# Patient Record
Sex: Female | Born: 1935
Health system: Southern US, Community
[De-identification: ages and names within clinical notes are randomized; demographics above are authoritative.]

## PROBLEM LIST (undated history)

## (undated) DIAGNOSIS — Z972 Presence of dental prosthetic device (complete) (partial): Secondary | ICD-10-CM

## (undated) DIAGNOSIS — M25561 Pain in right knee: Secondary | ICD-10-CM

## (undated) DIAGNOSIS — E785 Hyperlipidemia, unspecified: Secondary | ICD-10-CM

## (undated) DIAGNOSIS — D509 Iron deficiency anemia, unspecified: Secondary | ICD-10-CM

## (undated) DIAGNOSIS — N3941 Urge incontinence: Secondary | ICD-10-CM

## (undated) DIAGNOSIS — N159 Renal tubulo-interstitial disease, unspecified: Secondary | ICD-10-CM

## (undated) DIAGNOSIS — Z86718 Personal history of other venous thrombosis and embolism: Secondary | ICD-10-CM

## (undated) DIAGNOSIS — K219 Gastro-esophageal reflux disease without esophagitis: Secondary | ICD-10-CM

## (undated) DIAGNOSIS — R350 Frequency of micturition: Secondary | ICD-10-CM

## (undated) DIAGNOSIS — R3915 Urgency of urination: Secondary | ICD-10-CM

## (undated) DIAGNOSIS — Z9289 Personal history of other medical treatment: Secondary | ICD-10-CM

## (undated) DIAGNOSIS — H269 Unspecified cataract: Secondary | ICD-10-CM

## (undated) DIAGNOSIS — G2581 Restless legs syndrome: Secondary | ICD-10-CM

## (undated) DIAGNOSIS — S22000A Wedge compression fracture of unspecified thoracic vertebra, initial encounter for closed fracture: Secondary | ICD-10-CM

## (undated) DIAGNOSIS — J189 Pneumonia, unspecified organism: Secondary | ICD-10-CM

## (undated) DIAGNOSIS — R911 Solitary pulmonary nodule: Secondary | ICD-10-CM

## (undated) DIAGNOSIS — C679 Malignant neoplasm of bladder, unspecified: Secondary | ICD-10-CM

## (undated) DIAGNOSIS — J449 Chronic obstructive pulmonary disease, unspecified: Secondary | ICD-10-CM

## (undated) DIAGNOSIS — I700 Atherosclerosis of aorta: Secondary | ICD-10-CM

## (undated) DIAGNOSIS — M75102 Unspecified rotator cuff tear or rupture of left shoulder, not specified as traumatic: Secondary | ICD-10-CM

## (undated) DIAGNOSIS — K8020 Calculus of gallbladder without cholecystitis without obstruction: Secondary | ICD-10-CM

## (undated) DIAGNOSIS — I340 Nonrheumatic mitral (valve) insufficiency: Secondary | ICD-10-CM

## (undated) DIAGNOSIS — I071 Rheumatic tricuspid insufficiency: Secondary | ICD-10-CM

## (undated) DIAGNOSIS — K5790 Diverticulosis of intestine, part unspecified, without perforation or abscess without bleeding: Secondary | ICD-10-CM

## (undated) DIAGNOSIS — I517 Cardiomegaly: Secondary | ICD-10-CM

## (undated) DIAGNOSIS — Z973 Presence of spectacles and contact lenses: Secondary | ICD-10-CM

## (undated) DIAGNOSIS — M810 Age-related osteoporosis without current pathological fracture: Secondary | ICD-10-CM

## (undated) DIAGNOSIS — E669 Obesity, unspecified: Secondary | ICD-10-CM

## (undated) DIAGNOSIS — R600 Localized edema: Secondary | ICD-10-CM

## (undated) DIAGNOSIS — I4891 Unspecified atrial fibrillation: Secondary | ICD-10-CM

## (undated) DIAGNOSIS — Z8719 Personal history of other diseases of the digestive system: Secondary | ICD-10-CM

## (undated) HISTORY — PX: TOTAL ABDOMINAL HYSTERECTOMY W/ BILATERAL SALPINGOOPHORECTOMY: SHX83

## (undated) HISTORY — PX: CATARACT EXTRACTION, BILATERAL: SHX1313

## (undated) HISTORY — PX: COLONOSCOPY: SHX174

## (undated) HISTORY — DX: Pneumonia, unspecified organism: J18.9

## (undated) HISTORY — PX: TONSILLECTOMY: SUR1361

## (undated) HISTORY — DX: Gastro-esophageal reflux disease without esophagitis: K21.9

## (undated) HISTORY — PX: OTHER SURGICAL HISTORY: SHX169

## (undated) HISTORY — PX: APPENDECTOMY: SHX54

## (undated) HISTORY — DX: Unspecified atrial fibrillation: I48.91

---

## 1999-02-24 HISTORY — PX: OTHER SURGICAL HISTORY: SHX169

## 1999-12-11 ENCOUNTER — Emergency Department (HOSPITAL_COMMUNITY): Admission: EM | Admit: 1999-12-11 | Discharge: 1999-12-11 | Payer: Self-pay | Admitting: Emergency Medicine

## 2000-08-03 ENCOUNTER — Emergency Department (HOSPITAL_COMMUNITY): Admission: EM | Admit: 2000-08-03 | Discharge: 2000-08-04 | Payer: Self-pay | Admitting: Emergency Medicine

## 2001-03-11 ENCOUNTER — Ambulatory Visit (HOSPITAL_COMMUNITY): Admission: RE | Admit: 2001-03-11 | Discharge: 2001-03-11 | Payer: Self-pay | Admitting: Gastroenterology

## 2001-06-21 ENCOUNTER — Ambulatory Visit (HOSPITAL_COMMUNITY): Admission: RE | Admit: 2001-06-21 | Discharge: 2001-06-21 | Payer: Self-pay | Admitting: Family Medicine

## 2001-07-08 ENCOUNTER — Encounter: Payer: Self-pay | Admitting: Family Medicine

## 2001-07-08 ENCOUNTER — Encounter: Admission: RE | Admit: 2001-07-08 | Discharge: 2001-07-08 | Payer: Self-pay | Admitting: Family Medicine

## 2001-09-01 ENCOUNTER — Encounter (INDEPENDENT_AMBULATORY_CARE_PROVIDER_SITE_OTHER): Payer: Self-pay | Admitting: Specialist

## 2001-09-01 ENCOUNTER — Ambulatory Visit (HOSPITAL_COMMUNITY): Admission: RE | Admit: 2001-09-01 | Discharge: 2001-09-01 | Payer: Self-pay | Admitting: Gastroenterology

## 2001-12-09 ENCOUNTER — Encounter: Admission: RE | Admit: 2001-12-09 | Discharge: 2001-12-09 | Payer: Self-pay | Admitting: Family Medicine

## 2001-12-09 ENCOUNTER — Encounter: Payer: Self-pay | Admitting: Family Medicine

## 2002-01-11 ENCOUNTER — Ambulatory Visit (HOSPITAL_COMMUNITY): Admission: RE | Admit: 2002-01-11 | Discharge: 2002-01-11 | Payer: Self-pay | Admitting: Cardiology

## 2002-01-11 ENCOUNTER — Encounter: Payer: Self-pay | Admitting: Cardiology

## 2005-09-22 ENCOUNTER — Ambulatory Visit (HOSPITAL_COMMUNITY): Admission: RE | Admit: 2005-09-22 | Discharge: 2005-09-22 | Payer: Self-pay | Admitting: Gastroenterology

## 2008-05-21 ENCOUNTER — Ambulatory Visit: Payer: Self-pay | Admitting: Surgery

## 2008-05-21 ENCOUNTER — Emergency Department (HOSPITAL_COMMUNITY): Admission: EM | Admit: 2008-05-21 | Discharge: 2008-05-21 | Payer: Self-pay | Admitting: Emergency Medicine

## 2008-05-21 ENCOUNTER — Encounter (INDEPENDENT_AMBULATORY_CARE_PROVIDER_SITE_OTHER): Payer: Self-pay | Admitting: Emergency Medicine

## 2010-05-12 ENCOUNTER — Emergency Department (HOSPITAL_COMMUNITY): Payer: Medicare Other

## 2010-05-12 ENCOUNTER — Observation Stay (HOSPITAL_COMMUNITY): Payer: Medicare Other

## 2010-05-12 ENCOUNTER — Inpatient Hospital Stay (HOSPITAL_COMMUNITY)
Admission: EM | Admit: 2010-05-12 | Discharge: 2010-05-14 | DRG: 204 | Disposition: A | Discharge status: Home / Self Care | Payer: Medicare Other | Attending: Internal Medicine | Admitting: Internal Medicine

## 2010-05-12 DIAGNOSIS — F17200 Nicotine dependence, unspecified, uncomplicated: Secondary | ICD-10-CM | POA: Diagnosis present

## 2010-05-12 DIAGNOSIS — D72829 Elevated white blood cell count, unspecified: Secondary | ICD-10-CM | POA: Diagnosis present

## 2010-05-12 DIAGNOSIS — K8020 Calculus of gallbladder without cholecystitis without obstruction: Secondary | ICD-10-CM | POA: Diagnosis present

## 2010-05-12 DIAGNOSIS — R7309 Other abnormal glucose: Secondary | ICD-10-CM | POA: Diagnosis not present

## 2010-05-12 DIAGNOSIS — D509 Iron deficiency anemia, unspecified: Secondary | ICD-10-CM | POA: Diagnosis present

## 2010-05-12 DIAGNOSIS — J441 Chronic obstructive pulmonary disease with (acute) exacerbation: Secondary | ICD-10-CM | POA: Diagnosis present

## 2010-05-12 DIAGNOSIS — Z79899 Other long term (current) drug therapy: Secondary | ICD-10-CM

## 2010-05-12 DIAGNOSIS — K219 Gastro-esophageal reflux disease without esophagitis: Secondary | ICD-10-CM | POA: Diagnosis present

## 2010-05-12 DIAGNOSIS — J129 Viral pneumonia, unspecified: Secondary | ICD-10-CM | POA: Diagnosis present

## 2010-05-12 DIAGNOSIS — T380X5A Adverse effect of glucocorticoids and synthetic analogues, initial encounter: Secondary | ICD-10-CM | POA: Diagnosis not present

## 2010-05-12 DIAGNOSIS — R0602 Shortness of breath: Principal | ICD-10-CM | POA: Diagnosis present

## 2010-05-12 DIAGNOSIS — J679 Hypersensitivity pneumonitis due to unspecified organic dust: Secondary | ICD-10-CM | POA: Diagnosis present

## 2010-05-12 LAB — DIFFERENTIAL
Basophils Relative: 1 % (ref 0–1)
Eosinophils Absolute: 0.3 10*3/uL (ref 0.0–0.7)
Monocytes Relative: 5 % (ref 3–12)
Neutrophils Relative %: 79 % — ABNORMAL HIGH (ref 43–77)

## 2010-05-12 LAB — COMPREHENSIVE METABOLIC PANEL
AST: 21 U/L (ref 0–37)
Albumin: 3.5 g/dL (ref 3.5–5.2)
Calcium: 8.9 mg/dL (ref 8.4–10.5)
Chloride: 102 mEq/L (ref 96–112)
Creatinine, Ser: 0.78 mg/dL (ref 0.4–1.2)
GFR calc Af Amer: >60 mL/min (ref >60)
Total Protein: 7.2 g/dL (ref 6.0–8.3)

## 2010-05-12 LAB — CK TOTAL AND CKMB (NOT AT ARMC)
CK, MB: 0.9 ng/mL (ref 0.3–4.0)
Total CK: 32 U/L (ref 7–177)

## 2010-05-12 LAB — CBC
MCH: 27 pg (ref 26.0–34.0)
MCHC: 33.7 g/dL (ref 30.0–36.0)
Platelets: 310 10*3/uL (ref 150–400)
RBC: 4.33 MIL/uL (ref 3.87–5.11)

## 2010-05-12 LAB — POCT CARDIAC MARKERS: Myoglobin, poc: 67.4 ng/mL (ref 12–200)

## 2010-05-12 LAB — TROPONIN I: Troponin I: <0.01 ng/mL (ref 0.00–0.06)

## 2010-05-13 ENCOUNTER — Inpatient Hospital Stay (HOSPITAL_COMMUNITY): Payer: Medicare Other

## 2010-05-13 LAB — GLUCOSE, CAPILLARY
Glucose-Capillary: 182 mg/dL — ABNORMAL HIGH (ref 70–99)
Glucose-Capillary: 238 mg/dL — ABNORMAL HIGH (ref 70–99)

## 2010-05-13 LAB — CBC
MCH: 26.3 pg (ref 26.0–34.0)
MCV: 79.7 fL (ref 78.0–100.0)
Platelets: 334 10*3/uL (ref 150–400)
RDW: 13.8 % (ref 11.5–15.5)

## 2010-05-13 LAB — FOLATE: Folate: >20 ng/mL

## 2010-05-13 LAB — TSH: TSH: 0.831 u[IU]/mL (ref 0.350–4.500)

## 2010-05-13 LAB — COMPREHENSIVE METABOLIC PANEL
ALT: 13 U/L (ref 0–35)
Albumin: 3.2 g/dL — ABNORMAL LOW (ref 3.5–5.2)
Alkaline Phosphatase: 80 U/L (ref 39–117)
BUN: 15 mg/dL (ref 6–23)
Chloride: 103 mEq/L (ref 96–112)
Glucose, Bld: 150 mg/dL — ABNORMAL HIGH (ref 70–99)
Potassium: 4.3 mEq/L (ref 3.5–5.1)
Sodium: 136 mEq/L (ref 135–145)
Total Bilirubin: 0.2 mg/dL — ABNORMAL LOW (ref 0.3–1.2)

## 2010-05-13 LAB — CARDIAC PANEL(CRET KIN+CKTOT+MB+TROPI)
CK, MB: 1.3 ng/mL (ref 0.3–4.0)
CK, MB: 2.1 ng/mL (ref 0.3–4.0)
Relative Index: 2.9 — ABNORMAL HIGH (ref 0.0–2.5)
Total CK: 59 U/L (ref 7–177)
Total CK: 119 U/L (ref 7–177)
Troponin I: 0.02 ng/mL (ref 0.00–0.06)
Troponin I: 0.01 ng/mL (ref 0.00–0.06)

## 2010-05-13 LAB — IRON AND TIBC: Iron: <10 ug/dL — ABNORMAL LOW (ref 42–135)

## 2010-05-13 LAB — HEMOCCULT GUIAC POC 1CARD (OFFICE): Fecal Occult Bld: NEGATIVE

## 2010-05-13 MED ORDER — IOHEXOL 300 MG/ML  SOLN
100.0000 mL | Freq: Once | INTRAMUSCULAR | Status: AC | PRN
  Administered 2010-05-13: 100 mL via INTRAVENOUS

## 2010-05-14 LAB — DIFFERENTIAL
Basophils Absolute: 0 10*3/uL (ref 0.0–0.1)
Eosinophils Absolute: 0 10*3/uL (ref 0.0–0.7)
Eosinophils Relative: 0 % (ref 0–5)
Lymphs Abs: 2.5 10*3/uL (ref 0.7–4.0)
Neutrophils Relative %: 73 % (ref 43–77)

## 2010-05-14 LAB — CBC
MCV: 79.7 fL (ref 78.0–100.0)
Platelets: 289 10*3/uL (ref 150–400)
RBC: 3.69 MIL/uL — ABNORMAL LOW (ref 3.87–5.11)
RDW: 14.2 % (ref 11.5–15.5)
WBC: 13.5 10*3/uL — ABNORMAL HIGH (ref 4.0–10.5)

## 2010-05-14 LAB — GLUCOSE, CAPILLARY
Glucose-Capillary: 107 mg/dL — ABNORMAL HIGH (ref 70–99)
Glucose-Capillary: 114 mg/dL — ABNORMAL HIGH (ref 70–99)
Glucose-Capillary: 153 mg/dL — ABNORMAL HIGH (ref 70–99)

## 2010-05-15 NOTE — Discharge Summary (Signed)
Kristen Macias, Kristen Macias                 ACCOUNT NO.:  [id number]  MEDICAL RECORD NO.:  [id number]           PATIENT TYPE:  I  LOCATION:  4731                         FACILITY:  [facility]  PHYSICIAN:  Andreas Blower, MD       DATE OF BIRTH:  [birth year]  DATE OF ADMISSION:  05/12/2010 DATE OF DISCHARGE:                              DISCHARGE SUMMARY   PRIMARY CARE PHYSICIAN:  [facility].  DISCHARGE DIAGNOSES: 1. Shortness of breath, multifactorial. 2. Gastroesophageal reflux disease. 3. Anemia. 4. Iron deficiency anemia. 5. Tobacco use. 6. Leukocytosis. 7. Possible cholelithiasis. 8. Hyperglycemia due to steroids.  DISCHARGE MEDICATIONS: 1. Albuterol nebulizer 2.5 mg inhaled every 6 hours as needed for     shortness of breath. 2. Pulmicort 0.5 mg inhaled every 12 hours. 3. Ferrous sulfate 325 mg p.o. daily. 4. Guaifenesin/DM 100/10 mg per 5 mL every 4 hours as needed for     cough. 5. Atrovent 0.2 mg/mL, 0.5 mg every 6 hours as needed for shortness of     breath. 6. Moxifloxacin 400 mg p.o. daily. 7. Prednisone 20 mg p.o. daily for 2 days, then 10 mg p.o. daily for 2     days, then 5 mg p.o. daily for 2 days, and then discontinue. 8. Aleve 440 every 6 hours as needed for headaches. 9. NyQuil over-the-counter 1-2 tablets daily as needed for congestion. 10.Omeprazole 1 tablet p.o. q.a.m.  BRIEF ADMITTING HISTORY AND PHYSICAL:  Ms. Terhaar is a 77 year old Caucasian female with history of tobacco use, who has been having shortness of breath over the last 3 weeks, presented with shortness of breath.  RADIOLOGY/IMAGING:  The patient had chest x-ray, 2-view, which showed abnormal interstitial markings.  Probable chronic interstitial lung disease.  No definite acute findings.  The patient had CT of the chest with contrast which was negative for pulmonary embolus.  Scattered ground-glass attenuation throughout the lungs could be due to atelectasis, edema, or possibly  hypersensitivity pneumonitis.  Small hiatal hernia, partial visualization of gallstones without evidence of cholecystitis.  LABORATORY DATA:  CBC shows a white count of 13.5, hemoglobin 9.6, hematocrit 29.4.  Electrolytes normal with a creatinine of 0.86.  Liver function tests normal except albumin is 3.2.  Troponins negative x4. TSH is 0.831.  Serum iron is less than 10.  Vitamin B12 level was 942. Serum folate was greater than 20, ferritin was 44.  Fecal occult was negative.  1. Shortness of breath and cough, multifactorial.  The patient had a     CT of the chest which was negative for pulmonary embolism.  There     is a question about possible viral pneumonitis versus     hypersensitivity pneumonitis versus COPD exacerbation.  The     patient was started on steroids initially IV, was transitioned to     p.o. steroids with good improvement in her breathing.  The patient     was started on empiric moxifloxacin which she will continue for 5     more days to complete a 7-day course.  The patient will continue     steroid taper for 6  more days.  The patient was instructed to     follow with [facility] to have an outpatient appointment for     pulmonary function tests to determine if the patient has an     underlying diagnosis of COPD.  The patient was also encouraged     smoking cessation. 2. GERD.  Continue the patient on PPI. 3. Anemia, hemoglobin has been trending down during the course of the     hospital stay.  Fecal occult was negative.  Serum iron suggested     the patient had iron deficiency anemia.  As a result at the time of     discharge, she was started on ferrous sulfate.  The patient was     also instructed to follow with [facility] to determine if the     patient is due for a colonoscopy.  If she is, then [facility] to     help arrange for that. 4. Tobacco use.  The patient was encouraged smoking cessation.  The     patient was instructed that she can get nicotine gum  or lozenges as     an outpatient. 5. Leukocytosis, likely secondary to steroids, stable during the     course of the hospital stay.  Total time spent on discharge talking to the patient, the patient's family, and coordinating care was 35 minutes.   Andreas Blower, MD   SR/MEDQ  D:  05/14/2010  T:  05/15/2010  Job:  [id number]  Electronically Signed by Wardell Heath Callaway Hailes  on 05/15/2010 03:21:35 PM

## 2010-05-25 DIAGNOSIS — J189 Pneumonia, unspecified organism: Secondary | ICD-10-CM

## 2010-05-25 HISTORY — DX: Pneumonia, unspecified organism: J18.9

## 2010-05-26 NOTE — H&P (Signed)
Kristen Macias, Kristen Macias                 ACCOUNT NO.:  [id number]  MEDICAL RECORD NO.:  [id number]           PATIENT TYPE:  O  LOCATION:  1859                         FACILITY:  [facility]  PHYSICIAN:  Eduard Clos, MDDATE OF BIRTH:  1933/05/23  DATE OF ADMISSION:  05/12/2010 DATE OF DISCHARGE:                             HISTORY & PHYSICAL   PRIMARY CARE PHYSICIAN:  At [facility].  CHIEF COMPLAINT:  Shortness of breath.  HISTORY OF PRESENT ILLNESS:  This is a 77 year old female with ongoing tobacco abuse has been experiencing shortness of breath over the last 3 weeks.  Her symptoms originally started 5 weeks ago with cough with productive sputum.  She tried to take some over-the-counter medication despite which she was still having cough with productive sputum. Eventually, she started developing shortness of breath over the last 3 weeks which was present even at rest, increased with exertion, and also had associated pleuritic type of chest pain.  Chest pain is only when she takes a deep breath or cough.  The patient denies any nausea or vomiting.  Denies any abdominal pain, dysuria, discharge, or diarrhea. Denies any dizziness, focal deficit, loss of consciousness, headache, or visual symptoms.  In the ER, the patient had a chest x-ray at this time which shows nonspecific interstitial markings with no acute findings.  The patient was given treatment with nebulizer and some steroids despite which the patient is still short of breath.  At this time, we are not sure of the exact cause for shortness of breath.  The patient will be admitted for further workup.  At this time, I am ordering a CT angio chest.  The patient denies any recent travel outside [place] or came in contact with anybody with TB.  PAST MEDICAL HISTORY:  Tobacco abuse.  PAST SURGICAL HISTORY:  Hysterectomy and left rotator cuff surgery.  MEDICATIONS PRIOR TO ADMISSION:  Recently on Nyquil and uses  Prilosec for GERD.  SOCIAL HISTORY:  The patient lives with her daughter and granddaughter. Smoke cigarettes and drinks alcohol occasionally.  Denies any drug abuse.  ALLERGIES:  No known drug allergies.  FAMILY HISTORY:  Nothing contributory.  REVIEW OF SYSTEMS:  As present in history of present illness, nothing else significant.  PHYSICAL EXAMINATION:  GENERAL:  The patient examined at bedside, not in acute distress. VITAL SIGNS:  Blood pressure 106/60, pulse is 93 per minute, temperature 98, respirations 22-24 per minute, and O2 sat is 95%. HEENT:  Anicteric.  No pallor.  No discharge from ears, eyes, nose, and mouth. CHEST:  Bilateral air entry present.  At this time, I do not appreciate any rhonchi or crepitation. HEART:  S1-S2 heard. ABDOMEN:  Soft and nontender.  Bowel sounds heard. CNS:  Alert, awake, and oriented to time, place, and person.  Moves upper and lower extremities 5/5. EXTREMITIES:  Peripheral pulses felt.  No edema.  LABORATORY DATA:  EKG shows normal sinus rhythm with some atrial premature complexes, irregular rhythm, heart rate of 70 beats per minute with nonspecific ST-T changes, we do not have an old one to compare. Chest x-ray:  Abnormal interstitial  markings, probable chronic interstitial lung disease.  No definite acute findings.  CBC:  WBC is 12.2, hemoglobin is 11.7, hematocrit is 34.7, platelets 310, and neutrophils 79%.  Basic metabolic panel:  Sodium 137, potassium 4.4, chloride 102, carbon dioxide 29, glucose 96, BUN 13, creatinine 0.7, alk phos is 100, AST 21, ALT 13, total protein 7.2, albumin 3.5, and calcium 8.9.  CK-MB is 1, troponin I is less than 0.05, and myoglobin 67.4.  BNP is 133.  ASSESSMENT: 1. Shortness of breath.  At this time, I am not sure of the exact     etiology. 2. Pleuritic type of chest pain. 3. Anemia, normocytic and normochromic. 4. Tobacco abuse. 5. Gastroesophageal reflux disease.  PLAN: 1. At  this time,  admit the patient to Telemetry. 2. For her shortness of breath, the patient was using nebulizer which     I am going to continue.  We will also add some steroids and Avelox.     The patient also will be getting Pulmicort.  At this time, the     patient is not having any wheezing but I am sure if she had when     she came.  For now, I am going to get a CT angio chest and we are     going to get a 2-D echo and we will be cycling cardiac markers. 3. Pleuritic type of chest pain.  The patient chest pain is atypical.     We will be getting carotid Doppler and also we will be getting 2-D     echo. 4. Anemia.  The patient did have a colonoscopy twice, the last did not     show any polyp per the patient.  It was done in 2007.  We will     check anemia profile and stool for occult blood. 5. Tobacco abuse.  The patient will need tobacco abuse cessation     counseling. 6. Further recommendation based on tests ordered and the clinical     course.  We will also be getting an ABG.     Eduard Clos, MD     ANK/MEDQ  D:  05/12/2010  T:  05/12/2010  Job:  [id number]  Electronically Signed by Midge Minium MD on 05/26/2010 07:58:47 AM

## 2010-07-11 NOTE — Cardiovascular Report (Signed)
NAME:  Kristen Macias, BARTKO                           ACCOUNT NO.:  [id number]   MEDICAL RECORD NO.:  [id number]                   PATIENT TYPE:  OIB   LOCATION:  2899                                 FACILITY:  [facility]   PHYSICIAN:  Madaline Savage, M.D.             DATE OF BIRTH:  1933-08-16   DATE OF PROCEDURE:  01/11/2002  DATE OF DISCHARGE:                              CARDIAC CATHETERIZATION   PROCEDURES PERFORMED:  1. Selective coronary angiography by Judkins technique.  2. Retrograde left heart catheterization.  3. Left ventricular angiography.  4. Abdominal aortography.   COMPLICATIONS:  None.   ENTRY SITE:  Right femoral.   DYE USED:  Omnipaque.   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 These episodes are  frightening to her and she has awakened from sleep on occasion with the  chest pain.  It is described as a pressure-like sensation and is very  suspicious for angina.  A Cardiolite stress test performed December 30, 2001,  showed a left ventricular ejection fraction of 74% and there was thought to  be subtle minimal hypoperfusion in the anteroseptal area.  Based on these  findings, the patient was scheduled for this outpatient cardiac  catheterization, which was performed without complication.   RESULTS:  PRESSURES:  The left ventricular pressure was 145/14, end-  diastolic pressure 26, central aortic pressure 145/80, mean of 105.  No  aortic valve gradient by pullback technique.   ANGIOGRAPHIC RESULTS:  The left main coronary artery was medium in length,  fairly large in caliber and showed no stenosis.   The circumflex was a nondominant vessel which showed no lesions.   The left anterior descending coronary artery coursed to the cardiac apex and  became rather small in the distal one-half of its distribution.  It was  also  tortuous.  No lesions were seen.   The major diagonal branch arises very proximal and before the first septal  perforator branch.  This diagonal branch trifurcates distally and no lesions  are seen in the entirety of this vessel.   There is an intermediate ramus branch which is medium in size, which also  shows no lesions.   The right coronary artery is a large vessel giving rise to a posterior  descending branch and two branches off a fairly long posterolateral branch.  No lesion were seen in the entirety of the RCA and its branches.   The left ventricle contracts vigorously without any wall motion  abnormalities and ejection fraction estimate is 70%.  No mitral  regurgitation is seen.  There is no evidence of LV thrombosis formation.   ABDOMINAL AORTOGRAM:  Abdominal aortography showed a smooth normal sized  abdominal aorta, normal renal arteries, normal inferior mesenteric artery,  and normal common iliacs.   FINAL DIAGNOSES:  1. Angiographically patent coronary arteries.  2. Normal left ventricular systolic function with no wall motion     abnormalities.  3. Normal abdominal aorta.  4. Normal renal arteries.   PLAN:  The patient should follow up with primary care giver, Dr. Veda Canning at [facility] and should follow up with [place] on an as needed basis.                                                 Madaline Savage, M.D.    WHG/MEDQ  D:  01/11/2002  T:  01/11/2002  Job:  [id number]   cc:   Aleene Davidson, M.D.   Cardiac Catheterization Laboratory

## 2010-07-11 NOTE — Procedures (Signed)
[facility]  Patient:    Kristen Macias, Kristen Macias Visit Number: [id number] MRN: [id number]          Service Type: END Location: ENDO Attending Physician:  Louie Bun Dictated by:   Everardo All Madilyn Fireman, M.D. Proc. Date: 09/01/01 Admit Date:  09/01/2001 Discharge Date: 09/01/2001   CC:         [facility]   Procedure Report  PROCEDURE:  Colonoscopy with polypectomy.  INDICATION FOR PROCEDURE:  Screening colonoscopy.  DESCRIPTION OF PROCEDURE:  The patient was placed in the left lateral decubitus position then placed on the pulse monitor with continuous low flow oxygen delivered by nasal cannula. She was sedated with 100 mg IV Demerol and 10 mg IV Versed. The Olympus video colonoscope was inserted into the rectum and advanced to the cecum, confirmed by transillumination at McBurneys point and visualization of the ileocecal valve and appendiceal orifice. The prep was good. The cecum and ascending colon appeared normal with no masses, polyps, diverticula or other mucosal abnormalities. Within the transverse colon, there was a 1.2 cm polyp removed by snare. The descending colon appeared normal and within the sigmoid colon there was seen a pedunculated 1.5 cm polyp removed by snare and several diverticula. The rectum appeared normal and retroflexed view of the anus revealed no obvious internal hemorrhoids. The colonoscope was then withdrawn and the patient returned to the recovery room in stable condition. The patient tolerated the procedure well and there were no immediate complications.  IMPRESSION: 1. Transverse and sigmoid colon polyps. 2. Diverticulosis.  PLAN:  Await histology for determination of method and interval for future colon screening. Dictated by:   Everardo All Madilyn Fireman, M.D. Attending Physician:  Louie Bun DD:  09/01/01 TD:  09/04/01 Job: [id number]

## 2010-07-11 NOTE — Procedures (Signed)
[facility]  Patient:    Kristen Macias, Kristen Macias Visit Number: [id number] MRN: [id number]          Service Type: END Location: ENDO Attending Physician:  Louie Bun Dictated by:   Everardo All Madilyn Fireman, M.D. Proc. Date: 09/08/01 Admit Date:  03/11/2001 Discharge Date: 03/11/2001                             Procedure Report  PROCEDURE:  Esophagogastroduodenoscopy with esophageal dilatation.  INDICATION FOR PROCEDURE:  Refractory reflux symptoms with solid food dysphagia suggestive of lower esophageal ring or stricture.  DESCRIPTION OF PROCEDURE:  The patient was placed in the left lateral decubitus position and placed on the pulse monitor with continuous low-flow oxygen delivered by nasal cannula.  She was sedated with 60 mg IV Demerol and 6 mg IV Versed.  The Olympus video endoscope was advanced under direct vision into the oropharynx and esophagus.  The esophagus was slightly tortuous but of normal caliber with the squamocolumnar line somewhat interrupted at 36 cm. There were 1-2 small erosions without exudate, and there appeared to be some increased fibrosis and an ill-defined stricture which was not very tight and presented no resistance to passage of the scope beyond.  There was an approximately 4 cm hiatal hernia distal to the squamocolumnar line. The stomach was entered, and a small amount of liquid secretions were suctioned from the fundus.  Retroflexed view of the cardia was unremarkable except for confirming a hiatal hernia.  The fundus, body, antrum, and pylorus all appeared normal.  The duodenum was entered, and both the bulb and second portion were well-inspected and appeared to be within normal limits.  The scope was passed as far as possible down the distal duodenum, and a guidewire was placed.  The scope was withdrawn, and a 16 mm Savary dilator passed over the guidewire with minimal resistance, and no blood seen on withdrawal of the scope  together with the wire, and the patient returned to the recovery room in stable condition.  She tolerated the procedure well, and there were no immediate complications.  IMPRESSION: 1. Fairly patent lower esophageal stricture with mild esophagitis. 2. Hiatal hernia.  PLAN:  Advance diet and observe response to dilatation.  We will continue double-dose proton pump inhibitor for now.  Follow up in the office in a few weeks. Dictated by:   Everardo All Madilyn Fireman, M.D. Attending Physician:  Louie Bun DD:  03/11/01 TD:  03/13/01 Job: [id number]

## 2010-07-11 NOTE — Op Note (Signed)
NAMELESLE, FARON                 ACCOUNT NO.:  [id number]   MEDICAL RECORD NO.:  [id number]          PATIENT TYPE:  AMB   LOCATION:  ENDO                         FACILITY:  [facility]   PHYSICIAN:  John C. Madilyn Fireman, M.D.    DATE OF BIRTH:  Jan 04, 1934   DATE OF PROCEDURE:  09/22/2005  DATE OF DISCHARGE:                                 OPERATIVE REPORT   PROCEDURE:  Colonoscopy.   INDICATIONS FOR PROCEDURE:  History of colon polyps in 2004.   PROCEDURE:  The patient was placed in the left lateral decubitus position  and placed on the pulse monitor with continuous low-flow oxygen delivered by  nasal cannula.  She was sedated with 65 mcg IV fentanyl and 5 mg IV Versed.  Olympus video colonoscope was inserted into the rectum and advanced to the  cecum, confirmed by transillumination of McBurney's point and visualization  of ileocecal valve and appendiceal orifice.  Prep was good.  The cecum,  ascending, transverse colon all appeared normal with no masses, polyps,  diverticula or other mucosal abnormalities.  Within the descending and  sigmoid colon there were seen several scattered diverticula.  No other  abnormalities.  The rectum appeared normal on retroflex view.  The anus  revealed no obvious internal hemorrhoids.  Scope was then withdrawn and the  patient returned to the recovery room in stable condition.  She tolerated  the procedure well.  There were no immediate complications.   IMPRESSION:  Left-sided diverticulosis, otherwise normal study.   PLAN:  Repeat colonoscopy in 5 years.   Health           ______________________________  Everardo All. Madilyn Fireman, M.D.     JCH/MEDQ  D:  09/22/2005  T:  09/22/2005  Job:  [id number]   cc:   [employer]

## 2011-08-03 ENCOUNTER — Ambulatory Visit (INDEPENDENT_AMBULATORY_CARE_PROVIDER_SITE_OTHER): Payer: Medicare HMO | Admitting: Family Medicine

## 2011-08-03 ENCOUNTER — Encounter: Payer: Self-pay | Admitting: Family Medicine

## 2011-08-03 VITALS — BP 135/77 | HR 93 | Temp 98.2°F | Ht 63.0 in | Wt 187.4 lb

## 2011-08-03 DIAGNOSIS — R1900 Intra-abdominal and pelvic swelling, mass and lump, unspecified site: Secondary | ICD-10-CM

## 2011-08-03 DIAGNOSIS — Z7189 Other specified counseling: Secondary | ICD-10-CM | POA: Insufficient documentation

## 2011-08-03 DIAGNOSIS — M25473 Effusion, unspecified ankle: Secondary | ICD-10-CM | POA: Insufficient documentation

## 2011-08-03 DIAGNOSIS — R209 Unspecified disturbances of skin sensation: Secondary | ICD-10-CM

## 2011-08-03 DIAGNOSIS — K219 Gastro-esophageal reflux disease without esophagitis: Secondary | ICD-10-CM | POA: Insufficient documentation

## 2011-08-03 DIAGNOSIS — Z0000 Encounter for general adult medical examination without abnormal findings: Secondary | ICD-10-CM

## 2011-08-03 DIAGNOSIS — N644 Mastodynia: Secondary | ICD-10-CM | POA: Insufficient documentation

## 2011-08-03 DIAGNOSIS — N9489 Other specified conditions associated with female genital organs and menstrual cycle: Secondary | ICD-10-CM

## 2011-08-03 DIAGNOSIS — J449 Chronic obstructive pulmonary disease, unspecified: Secondary | ICD-10-CM

## 2011-08-03 DIAGNOSIS — R202 Paresthesia of skin: Secondary | ICD-10-CM

## 2011-08-03 DIAGNOSIS — N760 Acute vaginitis: Secondary | ICD-10-CM

## 2011-08-03 DIAGNOSIS — J441 Chronic obstructive pulmonary disease with (acute) exacerbation: Secondary | ICD-10-CM | POA: Insufficient documentation

## 2011-08-03 DIAGNOSIS — N898 Other specified noninflammatory disorders of vagina: Secondary | ICD-10-CM

## 2011-08-03 DIAGNOSIS — N949 Unspecified condition associated with female genital organs and menstrual cycle: Secondary | ICD-10-CM | POA: Insufficient documentation

## 2011-08-03 DIAGNOSIS — Z87891 Personal history of nicotine dependence: Secondary | ICD-10-CM

## 2011-08-03 DIAGNOSIS — G2581 Restless legs syndrome: Secondary | ICD-10-CM | POA: Insufficient documentation

## 2011-08-03 LAB — POCT WET PREP (WET MOUNT)

## 2011-08-03 LAB — POCT URINALYSIS DIPSTICK
Bilirubin, UA: NEGATIVE
Blood, UA: NEGATIVE
Glucose, UA: NEGATIVE
Leukocytes, UA: NEGATIVE
Nitrite, UA: NEGATIVE
Urobilinogen, UA: 0.2

## 2011-08-03 MED ORDER — ESTRADIOL 0.1 MG/GM VA CREA: TOPICAL_CREAM | VAGINAL | Status: DC

## 2011-08-03 MED ORDER — ALBUTEROL SULFATE HFA 108 (90 BASE) MCG/ACT IN AERS: 2.0000 | INHALATION_SPRAY | Freq: Four times a day (QID) | RESPIRATORY_TRACT | Status: DC | PRN

## 2011-08-03 NOTE — Assessment & Plan Note (Signed)
For several months. No worrisome findings today.  Ran out of time today. Advised to make follow-up.

## 2011-08-03 NOTE — Assessment & Plan Note (Addendum)
Suspect due to menopausal vaginal atrophy/dryness. Estrace cream 4 g daily x 2 weeks, then 2 g daily x 2 weeks. Maintence dose 1 g 2-3 times a week.  Follow-up in 1 month.

## 2011-08-03 NOTE — Assessment & Plan Note (Signed)
Significant tenderness lateral left breast for the past year. Unable to wear a bra now due to pain. No masses palpable.  Patient has longstanding history of smoking (quit last year).  Will check mammogram.

## 2011-08-03 NOTE — Progress Notes (Signed)
  Subjective:    Patient ID: Kristen Macias, female    DOB: Jul 02, 1933, 78 y.o.   MRN: [id number]  HPI 78 year old female here to establish care. She has not seen a provider for many years.   Acute complaints: 1. Leg swelling 2. Tingling down her legs. Denies numbness.  3. Pain in her "private parts". Denies dysuria, urinary frequency/urgency. For 1 year but getting worse. Not sexually active. 4. Abdominal swelling and discomfort for almost a year.  5. Difficulty sleeping 6. Left breast pain. Cannot wear bra. Has been going on for a while.   Review of Systems Per HPI.  Past Medical History, Family History, Social History, Allergies, and Medications reviewed. Family history significant for heart disease and diabetes in several first-degree relatives.    Objective:   Physical Exam GEN: Caucasian; mild-moderate discomfort, squirms occasionally in her chair (due to pelvic pain) PSYCH: engaged, appropriate, alert and oriented, conversant Breast: CV: RRR, no m/r/g PULM: CTAB without w/r/r BREAST: no nipple discharge, area of induration lower breasts, near folds bilaterally; no masses palpable; no axillary LAD; significant tenderness left breast LUQ and LLQ (1 o'clock and 4 o'clock respectively); no tenderness on right EXT: 0-1+ ankle edema bilaterally; negative calf pain and Homan's Skin: warm, dry GU:    Vulva: some redness at the introitus, R>L   Vagina: atrophy and significant tenderness vaginal wall   Cervix: none (s/p total hysterectomy)   Rectum: no hemorrhoids or tenderness    Assessment & Plan:

## 2011-08-03 NOTE — Patient Instructions (Addendum)
For the pain in your private parts. I think it is due to dryness. Use the cream 4 g daily for 2 weeks, then 2 g daily for 2 weeks.     -Follow-up in 1 month.   For the breast pain, call and schedule a mammogram.  Make a lab appointment for fasting labs.   Make an appointment to discuss your leg swelling, tingling, and belly swelling.   It was nice to meet you today.

## 2011-08-03 NOTE — Assessment & Plan Note (Signed)
Ran out of time today. Asked patient to make follow-up appointment.

## 2011-08-04 ENCOUNTER — Other Ambulatory Visit: Payer: Medicare HMO

## 2011-08-04 DIAGNOSIS — Z0000 Encounter for general adult medical examination without abnormal findings: Secondary | ICD-10-CM

## 2011-08-04 LAB — BASIC METABOLIC PANEL
BUN: 18 mg/dL (ref 6–23)
Chloride: 105 mEq/L (ref 96–112)
Potassium: 4.3 mEq/L (ref 3.5–5.3)
Sodium: 140 mEq/L (ref 135–145)

## 2011-08-04 LAB — LIPID PANEL
Cholesterol: 233 mg/dL — ABNORMAL HIGH (ref 0–200)
HDL: 41 mg/dL (ref >39)
LDL Cholesterol: 139 mg/dL — ABNORMAL HIGH (ref 0–99)
Triglycerides: 263 mg/dL — ABNORMAL HIGH (ref <150)
VLDL: 53 mg/dL — ABNORMAL HIGH (ref 0–40)

## 2011-08-04 NOTE — Progress Notes (Signed)
BMP AND FLP DONE TODAY Kristen Macias 

## 2011-08-05 ENCOUNTER — Encounter: Payer: Self-pay | Admitting: Family Medicine

## 2011-08-10 ENCOUNTER — Ambulatory Visit: Payer: Medicare HMO | Admitting: Family Medicine

## 2011-08-21 ENCOUNTER — Encounter: Payer: Self-pay | Admitting: Family Medicine

## 2011-08-21 ENCOUNTER — Ambulatory Visit (HOSPITAL_COMMUNITY)
Admission: RE | Admit: 2011-08-21 | Discharge: 2011-08-21 | Disposition: A | Discharge status: Home / Self Care | Payer: Medicare HMO | Source: Ambulatory Visit | Attending: Family Medicine | Admitting: Family Medicine

## 2011-08-21 ENCOUNTER — Ambulatory Visit (INDEPENDENT_AMBULATORY_CARE_PROVIDER_SITE_OTHER): Payer: Medicare HMO | Admitting: Family Medicine

## 2011-08-21 VITALS — BP 115/70 | HR 68 | Temp 96.9°F | Ht 63.0 in | Wt 184.2 lb

## 2011-08-21 DIAGNOSIS — M25473 Effusion, unspecified ankle: Secondary | ICD-10-CM

## 2011-08-21 DIAGNOSIS — R002 Palpitations: Secondary | ICD-10-CM

## 2011-08-21 DIAGNOSIS — R1900 Intra-abdominal and pelvic swelling, mass and lump, unspecified site: Secondary | ICD-10-CM

## 2011-08-21 MED ORDER — ALBUTEROL SULFATE HFA 108 (90 BASE) MCG/ACT IN AERS: 2.0000 | INHALATION_SPRAY | Freq: Four times a day (QID) | RESPIRATORY_TRACT | Status: DC | PRN

## 2011-08-21 MED ORDER — MEDICAL COMPRESSION STOCKINGS MISC: 1.0000 [IU] | Freq: Every day | Status: DC

## 2011-08-21 NOTE — Assessment & Plan Note (Signed)
Patient has history of constipation. I think this is causing her sensation of abdominal tightness/swelling. Will treat with Miralax, titrate up as needed.

## 2011-08-21 NOTE — Progress Notes (Signed)
  Subjective:    Patient ID: Kristen Macias, female    DOB: [birth year], 78 y.o.   MRN: [id number]  HPI 1. Acute: leg swelling She has noticed worsening leg swelling recently, especially at the end of the day.  She denies history of heart problems, including chest pain/palpitations, or liver problems.  She denies difficulty breathing.  She reports using a lot of sea salt in her diet.   2. Acute: abdominal swelling  She gets a feeling of fullness after meals and the feeling of fullness resolves with time.  She denies GERD symptoms associated with this swelling sensation. She has a history of GERD and this feels different. She denies abdominal pain.  She complains of constipation. She has tried 1 capful of Miralax in the past but this did not help.  She has BM every 3 days. Last about 2 days ago. Sometimes her stool is hard and she has to strain.  Review of Systems Per HPI.   Past Medical History, Family History, Social History, Allergies, and Medications reviewed. Significant for GERD, COPD.     Objective:   Physical Exam GEN: NAD, obese, well-appearing PSYCH: engaged, appropriate, alert and oriented, pleasant, appropriate to questions CV: additional heart sound after every 2-3 beats; regular rhythm, regular rate; no murmurs PULM: NI WOB; CTAB without w/r/r ABD: NABS, soft, NT, obese, no masses palpable; no umbilical or inguinal hernias EXT: non-pitting mild pedal edema; 2+ pedal pulses; warm, dry    Assessment & Plan:

## 2011-08-21 NOTE — Patient Instructions (Signed)
Leg swelling: -Try compression hose. Wear them as often during the day as you can.  -Low salt diet  Abdominal swelling. I think this is due to constipation -Try Miralax, start with 1 capful twice a day. -High fiber diet, increase activity (walking), drink at least 4-8 cups of water daily.   2 Gram Low Sodium Diet A 2 gram sodium diet restricts the amount of sodium in the diet to no more than 2 g or 2000 mg daily. Limiting the amount of sodium is often used to help lower blood pressure. It is important if you have heart, liver, or kidney problems. Many foods contain sodium for flavor and sometimes as a preservative. When the amount of sodium in a diet needs to be low, it is important to know what to look for when choosing foods and drinks. The following includes some information and guidelines to help make it easier for you to adapt to a low sodium diet. QUICK TIPS  Do not add salt to food.   Avoid convenience items and fast food.   Choose unsalted snack foods.   Buy lower sodium products, often labeled as "lower sodium" or "no salt added."   Check food labels to learn how much sodium is in 1 serving.   When eating at a restaurant, ask that your food be prepared with less salt or none, if possible.  READING FOOD LABELS FOR SODIUM INFORMATION The nutrition facts label is a good place to find how much sodium is in foods. Look for products with no more than 500 to 600 mg of sodium per meal and no more than 150 mg per serving. Remember that 2 g = 2000 mg. The food label may also list foods as:  Sodium-free: Less than 5 mg in a serving.   Very low sodium: 35 mg or less in a serving.   Low-sodium: 140 mg or less in a serving.   Light in sodium: 50% less sodium in a serving. For example, if a food that usually has 300 mg of sodium is changed to become light in sodium, it will have 150 mg of sodium.   Reduced sodium: 25% less sodium in a serving. For example, if a food that usually has 400  mg of sodium is changed to reduced sodium, it will have 300 mg of sodium.  CHOOSING FOODS Grains  Avoid: Salted crackers and snack items. Some cereals, including instant hot cereals. Bread stuffing and biscuit mixes. Seasoned rice or pasta mixes.   Choose: Unsalted snack items. Low-sodium cereals, oats, puffed wheat and rice, shredded wheat. English muffins and bread. Pasta.  Meats  Avoid: Salted, canned, smoked, spiced, pickled meats, including fish and poultry. Bacon, ham, sausage, cold cuts, hot dogs, anchovies.   Choose: Low-sodium canned tuna and salmon. Fresh or frozen meat, poultry, and fish.  Dairy  Avoid: Processed cheese and spreads. Cottage cheese. Buttermilk and condensed milk. Regular cheese.   Choose: Milk. Low-sodium cottage cheese. Yogurt. Sour cream. Low-sodium cheese.  Fruits and Vegetables  Avoid: Regular canned vegetables. Regular canned tomato sauce and paste. Frozen vegetables in sauces. Olives. Rosita Fire. Relishes. Sauerkraut.   Choose: Low-sodium canned vegetables. Low-sodium tomato sauce and paste. Frozen or fresh vegetables. Fresh and frozen fruit.  Condiments  Avoid: Canned and packaged gravies. Worcestershire sauce. Tartar sauce. Barbecue sauce. Soy sauce. Steak sauce. Ketchup. Onion, garlic, and table salt. Meat flavorings and tenderizers.   Choose: Fresh and dried herbs and spices. Low-sodium varieties of mustard and ketchup. Lemon juice. [place]  sauce. Horseradish.  SAMPLE 2 GRAM SODIUM MEAL PLAN Breakfast / Sodium (mg)  1 cup low-fat milk / 143 mg   2 slices whole-wheat toast / 270 mg   1 tbs heart-healthy margarine / 153 mg   1 hard-boiled egg / 139 mg   1 small orange / 0 mg  Lunch / Sodium (mg)  1 cup raw carrots / 76 mg    cup hummus / 298 mg   1 cup low-fat milk / 143 mg    cup red grapes / 2 mg   1 whole-wheat pita bread / 356 mg  Dinner / Sodium (mg)  1 cup whole-wheat pasta / 2 mg   1 cup low-sodium tomato sauce / 73 mg    3 oz lean ground beef / 57 mg   1 small side salad (1 cup raw spinach leaves,  cup cucumber,  cup yellow bell pepper) with 1 tsp olive oil and 1 tsp red wine vinegar / 25 mg  Snack / Sodium (mg)  1 container low-fat vanilla yogurt / 107 mg   3 graham cracker squares / 127 mg  Nutrient Analysis  Calories: 2033   Protein: 77 g   Carbohydrate: 282 g   Fat: 72 g   Sodium: 1971 mg  Document Released: 02/09/2005 Document Revised: 01/29/2011 Document Reviewed: 05/13/2009 [facility] Patient Information 2012 [place], [place].

## 2011-08-21 NOTE — Assessment & Plan Note (Signed)
Appears to be consistent with dependent edema. Compression stockings, low salt diet.

## 2011-08-28 ENCOUNTER — Ambulatory Visit (INDEPENDENT_AMBULATORY_CARE_PROVIDER_SITE_OTHER): Payer: Medicare HMO | Admitting: Family Medicine

## 2011-08-28 ENCOUNTER — Encounter: Payer: Self-pay | Admitting: Family Medicine

## 2011-08-28 VITALS — BP 118/77 | HR 62 | Temp 97.9°F | Ht 63.0 in | Wt 183.3 lb

## 2011-08-28 DIAGNOSIS — R209 Unspecified disturbances of skin sensation: Secondary | ICD-10-CM

## 2011-08-28 DIAGNOSIS — R202 Paresthesia of skin: Secondary | ICD-10-CM

## 2011-08-28 DIAGNOSIS — H9313 Tinnitus, bilateral: Secondary | ICD-10-CM

## 2011-08-28 DIAGNOSIS — G2581 Restless legs syndrome: Secondary | ICD-10-CM

## 2011-08-28 DIAGNOSIS — Z0000 Encounter for general adult medical examination without abnormal findings: Secondary | ICD-10-CM

## 2011-08-28 DIAGNOSIS — H9319 Tinnitus, unspecified ear: Secondary | ICD-10-CM

## 2011-08-28 DIAGNOSIS — Z1231 Encounter for screening mammogram for malignant neoplasm of breast: Secondary | ICD-10-CM

## 2011-08-28 MED ORDER — CALCIUM-VITAMIN D 500-200 MG-UNIT PO TABS: 2.0000 | ORAL_TABLET | Freq: Two times a day (BID) | ORAL | Status: DC

## 2011-08-28 MED ORDER — ROPINIROLE HCL 0.5 MG PO TABS: ORAL_TABLET | ORAL | Status: DC

## 2011-08-28 NOTE — Assessment & Plan Note (Signed)
Seems consistent with RLS.  Start ropinirole. Will titrate up to 1 mg daily; maximum dose if 3 mg daily. May titrate 0.5 mg/week if symptoms not improved at follow-up.

## 2011-08-28 NOTE — Assessment & Plan Note (Signed)
Try ropinirole.  Follow-up in 1 month.

## 2011-08-28 NOTE — Assessment & Plan Note (Signed)
Started a few months ago.  Not pulsatile in quality and not associated with other symptoms, including hearing loss/neurological deficits. Most likely presbycusis at this time. However, other causes of tinnitus include:   -CNS process (AV malformation, AV fistula, paraganglioma)   -Infection   -Acoustic neuroma Patient given indications to return to clinic. Her symptoms do not seem to bother her significantly at this time. However, if they do, consider referring her for formal audiology evaluation to see if she does have sensorineural hearing loss that may benefit from cochlear implants.

## 2011-08-28 NOTE — Progress Notes (Signed)
  Subjective:    Patient ID: Kristen Macias, female    DOB: 1933/05/14, 78 y.o.   MRN: [id number]  HPI 1. Acute visit: tingling in her legs at night It has been going on for a while.  She has no history of diabetes and no leg pain.  It sometimes wakes her up from sleep.  Alleviated by: nothing Exacerbated by: nothing Medications tried: aspirin does not help  2. Ringing in her ears Duration: 3 months Quality: constant ringing No history of injury to her ears recently, she does not listen to loud music or is exposed to loud noises. She denies history of this as well.  She denies recent illness. Alleviated by: nothing Exacerbated by: nothing Associated symptoms: denies difficulty hearing/ lightheadedness/vertigo/nausea/headache  Review of Systems Per HPI  Past Medical History, Family History, Social History, Allergies, and Medications reviewed. History of COPD    Objective:   Physical Exam GEN: NAD; well-nourished, -appearing PSYCH: engaged, appropriate, normally conversant, alert and oriented HEENT:   [place]/AT, no sinus tenderness   Normal conjunctiva   TM clear bilaterally, hearing grossly intact   No rhinorrhea, normal turbinates   MMM   No bruits auscultated bilaterally NEURO:   CN II-XII grossly intact CV: RRR PULM: NI WOB MSK:    Sensation/strength intact lower extremities    Gait: normal   Reflexes: 2+ patellar, no clonus    Skin: warm, dry   Pedal pulses: 2+ EXT: no edema     Assessment & Plan:

## 2011-08-28 NOTE — Assessment & Plan Note (Signed)
Will refer for screening mammogram.

## 2011-08-28 NOTE — Patient Instructions (Addendum)
Take ropinirole for your restless legs. Take as directed on bottle.   For ringing on your ears, if it gets worse, we can consider referring you to a hearing doctor. Return to clinic if you detect hearing loss.   We will refer you for a mammogram.   Take the calcium and vitamin D.  Follow-up in 1 month at the [facility].

## 2011-09-17 ENCOUNTER — Ambulatory Visit (HOSPITAL_COMMUNITY)
Admission: RE | Admit: 2011-09-17 | Discharge: 2011-09-17 | Disposition: A | Discharge status: Home / Self Care | Payer: Medicare HMO | Source: Ambulatory Visit | Attending: Family Medicine | Admitting: Family Medicine

## 2011-09-17 DIAGNOSIS — Z1231 Encounter for screening mammogram for malignant neoplasm of breast: Secondary | ICD-10-CM

## 2011-09-24 ENCOUNTER — Encounter: Payer: Self-pay | Admitting: Family Medicine

## 2011-09-24 ENCOUNTER — Ambulatory Visit (INDEPENDENT_AMBULATORY_CARE_PROVIDER_SITE_OTHER): Payer: Medicare HMO | Admitting: Family Medicine

## 2011-09-24 VITALS — BP 122/79 | HR 62 | Ht 63.0 in | Wt 183.0 lb

## 2011-09-24 DIAGNOSIS — Z23 Encounter for immunization: Secondary | ICD-10-CM

## 2011-09-24 DIAGNOSIS — R209 Unspecified disturbances of skin sensation: Secondary | ICD-10-CM

## 2011-09-24 DIAGNOSIS — R202 Paresthesia of skin: Secondary | ICD-10-CM

## 2011-09-24 DIAGNOSIS — R1900 Intra-abdominal and pelvic swelling, mass and lump, unspecified site: Secondary | ICD-10-CM

## 2011-09-24 LAB — FERRITIN: Ferritin: 10 ng/mL (ref 10–291)

## 2011-09-24 LAB — CBC
Hemoglobin: 11.3 g/dL — ABNORMAL LOW (ref 12.0–15.0)
MCH: 24.6 pg — ABNORMAL LOW (ref 26.0–34.0)
MCHC: 32.8 g/dL (ref 30.0–36.0)
RDW: 15.2 % (ref 11.5–15.5)

## 2011-09-24 MED ORDER — ALBUTEROL SULFATE HFA 108 (90 BASE) MCG/ACT IN AERS: 2.0000 | INHALATION_SPRAY | Freq: Four times a day (QID) | RESPIRATORY_TRACT | Status: DC | PRN

## 2011-09-24 MED ORDER — TRAMADOL HCL 50 MG PO TABS: 50.0000 mg | ORAL_TABLET | Freq: Every evening | ORAL | Status: AC | PRN

## 2011-09-24 NOTE — Patient Instructions (Addendum)
For your restless legs: -Try Tramadol 1 hour before bedtime  For abdominal bloating: -Try eating a snack or something every 3-4 hours  If your lab results are normal, I will send you a letter with the results. If abnormal, someone at the clinic will get in touch with you.   Follow-up in 6 months

## 2011-09-24 NOTE — Assessment & Plan Note (Signed)
Recommending eating at least every few hours to see if this helps Consider IBS medications Gluten sensitivity possible but not high probability based on history. With no good diagnostic tests, will defer work-up for now

## 2011-09-24 NOTE — Progress Notes (Signed)
  Subjective:    Patient ID: Kristen Macias, female    DOB: 07/07/1933, 78 y.o.   MRN: [id number]  HPI # Geriatric assessment  IADL Independent Needs Assistance Dependent  Cooking x    Housework x    Manage Medications x    Manage the telephone x    Shopping for food, clothes, Meds, etc x    Use transportation x    Manage Finances x       # Abdominal bloating After eating. Not dependent on what foods she eats (no difference with wheat).  She eats small meals but sometimes goes through long periods without eating.  She eats 1 yogurt a day  She has 2-3 bowel movements daily on Miralax. Her stools are normal.   # Restless leg syndrome Requip helped but caused nausea/vomiting in the morning She has a "creep-crawly" feeling her legs when she lays down at night She lays down around 11:30 pm but wakes up 2- 3 times a night. No TV/computer prior to bedtime.  Review of Systems Per HPI Denies inability to control bowel/urine or numbness  Allergies, medication, past medical history reviewed.  History of COPD--uses albuterol every morning per routine and then infrequently for exacerbations    Objective:   Physical Exam GEN: NAD; well-appearing; overweight CV: RRR PULM: NI WOB; CTAB ABD: soft, NT, distended, obese EXT: no edema SKIN: warm, dry, no rash  NEURO:    Sensation intact lower extremities   Motor: 5/5 strength bilaterally     Assessment & Plan:

## 2011-09-24 NOTE — Assessment & Plan Note (Signed)
Requip helped but caused nausea.  -Try Tramadol qhs prn.  -Will also check for iron-deficiency and B-12 deficiency

## 2011-09-28 ENCOUNTER — Telehealth: Payer: Self-pay | Admitting: Family Medicine

## 2011-09-28 MED ORDER — VITAMIN D (ERGOCALCIFEROL) 1.25 MG (50000 UNIT) PO CAPS: 50000.0000 [IU] | ORAL_CAPSULE | ORAL | Status: DC

## 2011-09-28 NOTE — Telephone Encounter (Signed)
Message copied by [facility], Etta Quill on Mon Sep 28, 2011  2:00 PM ------      Message from: [place]      Created: Fri Sep 25, 2011 10:05 AM      Regarding: Iron deficiency       She needs more iron and evaluation of where she is losing blood.       ----- Message -----         From: [facility]         Sent: 09/24/2011   7:03 PM           To: Tobin Chad, MD

## 2011-09-28 NOTE — Telephone Encounter (Signed)
Tramadol made her nauseous (for her restless legs).  Advised stopping or taking 1/2 or 1/4 tablet since it did help her symptoms.   Will send in Rx for vitamin D 50,000 weekly for 8 weeks.  Stop OTC daily vitamin D until finish higher dose and then may resume.

## 2011-12-08 ENCOUNTER — Ambulatory Visit (HOSPITAL_COMMUNITY)
Admission: RE | Admit: 2011-12-08 | Discharge: 2011-12-08 | Disposition: A | Discharge status: Home / Self Care | Payer: Medicare HMO | Source: Ambulatory Visit | Attending: Family Medicine | Admitting: Family Medicine

## 2011-12-08 ENCOUNTER — Ambulatory Visit (INDEPENDENT_AMBULATORY_CARE_PROVIDER_SITE_OTHER): Payer: Medicare HMO | Admitting: Family Medicine

## 2011-12-08 ENCOUNTER — Encounter: Payer: Self-pay | Admitting: Family Medicine

## 2011-12-08 VITALS — BP 129/80 | HR 93 | Temp 98.3°F | Ht 63.0 in | Wt 191.0 lb

## 2011-12-08 DIAGNOSIS — R0602 Shortness of breath: Secondary | ICD-10-CM | POA: Insufficient documentation

## 2011-12-08 DIAGNOSIS — R0989 Other specified symptoms and signs involving the circulatory and respiratory systems: Secondary | ICD-10-CM

## 2011-12-08 DIAGNOSIS — R0600 Dyspnea, unspecified: Secondary | ICD-10-CM

## 2011-12-08 DIAGNOSIS — J984 Other disorders of lung: Secondary | ICD-10-CM | POA: Insufficient documentation

## 2011-12-08 DIAGNOSIS — R911 Solitary pulmonary nodule: Secondary | ICD-10-CM

## 2011-12-08 DIAGNOSIS — I517 Cardiomegaly: Secondary | ICD-10-CM | POA: Insufficient documentation

## 2011-12-08 DIAGNOSIS — K449 Diaphragmatic hernia without obstruction or gangrene: Secondary | ICD-10-CM | POA: Insufficient documentation

## 2011-12-08 HISTORY — DX: Solitary pulmonary nodule: R91.1

## 2011-12-08 LAB — CBC
HCT: 32.5 % — ABNORMAL LOW (ref 36.0–46.0)
MCH: 23.9 pg — ABNORMAL LOW (ref 26.0–34.0)
MCV: 73.9 fL — ABNORMAL LOW (ref 78.0–100.0)
RBC: 4.4 MIL/uL (ref 3.87–5.11)
RDW: 16.2 % — ABNORMAL HIGH (ref 11.5–15.5)
WBC: 6.6 10*3/uL (ref 4.0–10.5)

## 2011-12-08 LAB — D-DIMER, QUANTITATIVE: D-Dimer, Quant: 0.71 ug/mL-FEU — ABNORMAL HIGH (ref 0.00–0.48)

## 2011-12-08 LAB — COMPREHENSIVE METABOLIC PANEL
BUN: 12 mg/dL (ref 6–23)
CO2: 25 mEq/L (ref 19–32)
Calcium: 9.5 mg/dL (ref 8.4–10.5)
Chloride: 102 mEq/L (ref 96–112)
Creat: 0.84 mg/dL (ref 0.50–1.10)
Total Bilirubin: 0.3 mg/dL (ref 0.3–1.2)

## 2011-12-08 MED ORDER — IOHEXOL 350 MG/ML SOLN
100.0000 mL | Freq: Once | INTRAVENOUS | Status: AC | PRN
  Administered 2011-12-08: 100 mL via INTRAVENOUS

## 2011-12-08 NOTE — Progress Notes (Signed)
  Subjective:    Patient ID: Kristen Macias, female    DOB: [birth year], 78 y.o.   MRN: [id number]  HPI # Shortness-of-breath This started 2 weeks ago and has been worsening. She is now having trouble walking distances (up stairs, to her mailbox) that she could previously do without problem.  She denies changes in her activities prior to onset of dyspnea, such as long car rides or prolonged sitting.   She has a history of COPD and tobacco use but she quit a year ago. She denies any recent exacerbations. She has been using her albuterol several times days without any relief.   She denies history of cardiac disease. Her dyspnea is not worsened with lying. She has gained weight recently (8 lbs past 2 months), and she reports this is unintentional; she is on a diet.   Review of Systems Per HPI Denies changes in appetite, nausea/vomiting. Last bowel movement today Denies calf pain Denies chest pain or palpitations  Allergies, medication, past medical history reviewed.  Significant for: -GERD -Past tobacco use, COPD -RLS    Objective:   Physical Exam GEN: very dyspneic even with sitting PSYCH: pleasant, alert and oriented CV: RRR, no m/r/g PULM: tachypneic; mild supraclavicular retractions; good air movement; CTAB without wheezes/rales/ronchi ABD: soft, NT, NABS, distended/obese EXT: 1+ pitting edema half-way up shins; no calf tenderness; negative Homan's  ECG: NSR, no ST or T-wave changes, normal axis and intervals    Assessment & Plan:

## 2011-12-08 NOTE — Assessment & Plan Note (Signed)
Dyspnea for the past 2 weeks that has been worsening. It is with rest and activity. Her vitals signs are stable; even with ambulation, O2 saturations 97-99%. Her physical exam is also unremarkable.  However, her significant dyspnea is concerning.  -Check CTA to evaluate for PE and interstitial pulmonary process>>>scheduled for 1:30 pm today -Check D-dimer, CMET, CBC, pro-BNP  I will call patient with results.  Patient precepted with attending Dr. [place].

## 2011-12-08 NOTE — Patient Instructions (Addendum)
Please go to [facility] (first floor radiology) at 1:30 pm  I will call you with your lab results

## 2011-12-09 ENCOUNTER — Encounter: Payer: Self-pay | Admitting: Family Medicine

## 2011-12-09 ENCOUNTER — Ambulatory Visit (INDEPENDENT_AMBULATORY_CARE_PROVIDER_SITE_OTHER): Payer: Medicare HMO | Admitting: Family Medicine

## 2011-12-09 VITALS — BP 108/77 | HR 66 | Temp 98.3°F | Ht 63.0 in | Wt 190.0 lb

## 2011-12-09 DIAGNOSIS — R0600 Dyspnea, unspecified: Secondary | ICD-10-CM

## 2011-12-09 DIAGNOSIS — R0609 Other forms of dyspnea: Secondary | ICD-10-CM

## 2011-12-09 LAB — POCT URINALYSIS DIPSTICK
Bilirubin, UA: NEGATIVE
Blood, UA: NEGATIVE
Glucose, UA: NEGATIVE
Ketones, UA: NEGATIVE
Nitrite, UA: NEGATIVE

## 2011-12-09 LAB — IBC PANEL
TIBC: 461 ug/dL (ref 250–470)
UIBC: 391 ug/dL (ref 125–400)

## 2011-12-09 LAB — IRON: Iron: 70 ug/dL (ref 42–145)

## 2011-12-09 LAB — POCT SEDIMENTATION RATE: POCT SED RATE: 67 mm/hr — AB (ref 0–22)

## 2011-12-09 MED ORDER — POLYETHYLENE GLYCOL 3350 17 GM/SCOOP PO POWD: 17.0000 g | Freq: Every day | ORAL | Status: DC

## 2011-12-09 MED ORDER — FERROUS SULFATE 325 (65 FE) MG PO TABS: 325.0000 mg | ORAL_TABLET | Freq: Two times a day (BID) | ORAL | Status: DC

## 2011-12-09 MED ORDER — ALBUTEROL SULFATE HFA 108 (90 BASE) MCG/ACT IN AERS: 2.0000 | INHALATION_SPRAY | Freq: Four times a day (QID) | RESPIRATORY_TRACT | Status: DC | PRN

## 2011-12-09 NOTE — Assessment & Plan Note (Signed)
Significant findings from diagnostic work done yesterday:    -CTA shows no PE, effusion, consolidations.    -pro-BNP normal   -normal WBC   -low hgb (10.5 but this is about her baseline), low MCV, elevated RDW   -ECG WNL  She is feeling better today, and she appears slightly less dyspneic than yesterday. Her O2 saturations are 94-97% with ambulation.   PLAN:    -Work-up anemia: hemoccult negative, will check urinalysis and iron panel. Start iron bid with Miralax prn.   -Check TSH   -Patient will schedule appointment with Dr. Raymondo Band for PFT prior to her appointment with me next week   -Given red flags to return to clinic or go to ED

## 2011-12-09 NOTE — Addendum Note (Signed)
Addended by: [place], Louvina Cleary on: 12/09/2011 03:31 PM   Modules accepted: Orders

## 2011-12-09 NOTE — Addendum Note (Signed)
Addended by: [place], Imajean Mcdermid on: 12/09/2011 12:27 PM   Modules accepted: Orders

## 2011-12-09 NOTE — Progress Notes (Signed)
  Subjective:    Patient ID: Kristen Macias, female    DOB: 10/26/1933, 78 y.o.   MRN: [id number]  HPI # SOB May be a "little better" than yesterday  Other historical information: -She has had occasional cough productive of white-green sputum since dyspnea started -She denies blood in stool or urine  Review of Systems Denies chest pain, palpitations Endorses feeling "hot and cold" when asked about fevers and chills  Allergies, medication, past medical history reviewed.  She was diagnosed with COPD after being diagnosed with pneumonia about 2 years ago. She denies history of COPD exacerbation.     Objective:   Physical Exam GEN: she appears slightly less dyspneic than yesterday  CV: RRR, normal S1/S2, no murmurs PULM: tachypneic and dyspneic; good air movement; CTAB throughout all lung fields without wheezes, rales ronchi EXT: no clubbing or cyanosis SKIN: warm, dry PSYCH: not anxious-appearing    Assessment & Plan:

## 2011-12-09 NOTE — Patient Instructions (Addendum)
Start iron twice a day May need Miralax (laxative) 1 capful daily. Goal is 1 bowel movement every 1-2 days  Make an appointment with Dr. Raymondo Band (pharmacist) for breathing tests (PFTs) Then make an appointment with Dr. Madolyn Frieze afterwards on the same day  I will discuss your lab results with you at your next visit  If your breathing worsens, please return to the clinic or go to the ED

## 2011-12-18 ENCOUNTER — Encounter: Payer: Self-pay | Admitting: Pharmacist

## 2011-12-18 ENCOUNTER — Ambulatory Visit (INDEPENDENT_AMBULATORY_CARE_PROVIDER_SITE_OTHER): Payer: Medicare HMO | Admitting: Family Medicine

## 2011-12-18 ENCOUNTER — Ambulatory Visit (INDEPENDENT_AMBULATORY_CARE_PROVIDER_SITE_OTHER): Payer: Medicare HMO | Admitting: Pharmacist

## 2011-12-18 VITALS — BP 115/63 | HR 74 | Ht 63.0 in | Wt 188.8 lb

## 2011-12-18 VITALS — BP 115/63 | HR 74 | Ht 63.0 in | Wt 188.0 lb

## 2011-12-18 DIAGNOSIS — Z23 Encounter for immunization: Secondary | ICD-10-CM

## 2011-12-18 DIAGNOSIS — H9313 Tinnitus, bilateral: Secondary | ICD-10-CM

## 2011-12-18 DIAGNOSIS — H9319 Tinnitus, unspecified ear: Secondary | ICD-10-CM

## 2011-12-18 DIAGNOSIS — R0989 Other specified symptoms and signs involving the circulatory and respiratory systems: Secondary | ICD-10-CM

## 2011-12-18 DIAGNOSIS — I499 Cardiac arrhythmia, unspecified: Secondary | ICD-10-CM

## 2011-12-18 DIAGNOSIS — R0600 Dyspnea, unspecified: Secondary | ICD-10-CM

## 2011-12-18 NOTE — Patient Instructions (Addendum)
Thank you for coming in today for your lung function tests.  Your results show you have near normal lung function.  You should continue to use your albuterol inhaler with the technique that we discussed today.    With your inhaler: Breathe OUT before you use your inhaler, then take a long, deep breath IN as you compress your inhaler.  After this, try to hold your breath for 5-10 seconds to hold the medicine in your lungs.  Wait 1-2 minutes before your second puff.  You will follow up with Dr. Madolyn Frieze today in clinic.

## 2011-12-18 NOTE — Patient Instructions (Signed)
Re-start calcium and vitamin D. Take Miralax as needed so you have bowel movements every 1-2 days.   Follow-up in 3 months.   Flu shot today  Return sooner if you have chest pain or palpitations that don't go away even after you stop drinking coffee for 24 hours or if your breathing gets worse.

## 2011-12-18 NOTE — Assessment & Plan Note (Signed)
May have been due to COPD exacerbation and/or URI. It is improving. She was not using her albuterol inhaler appropriately and was shown today at PFT clinic proper way to use. She was informed it would be okay for her to start exercising but not to exert herself significantly and to make sure she has her inhaler on hand.

## 2011-12-18 NOTE — Progress Notes (Signed)
  Subjective:    Patient ID: Kristen Macias, female    DOB: November 02, 1933, 78 y.o.   MRN: [id number]  HPI # Dypsnea It is improving. She is less winded now going to her mailbox She saw Dr. Raymondo Band for PFTs today and was told she was not using her albuterol inhaler correctly. She felt better after the nebulizer treatment and is complaining of very mild difficulty breathing at this time.   # Irregular heart rhythm Last ECG showed frequent PAC She drinks 2 pots of coffee daily ROS: denies chest pain or palpitations  # Worsening ringing in her ears It is present all the time but gets louder at bedtime Denies hearing changes, fevers, congestion   Review of Systems Per HPI  Allergies, medication, past medical history reviewed.      Objective:   Physical Exam O2 sat while talking to me: 100% GEN: NAD HEENT:   Head: [place]/AT   Eyes: normal conjunctiva without injection or tearing   Ears: TM clear bilaterally with good light reflex and without erythema or air-fluid level; hearing grossly intact; does not have problem hearing/conversing normal tone of voice    Nose: no rhinorrhea, normal turbinates   Mouth: MMM; no tonsillar adenopathy; no oropharyngeal erythema NECK: no LAD CV: irregular rhythm, normal S1/S2, no murmurs PULM: NI WOB; CTAB without wheezes or rales    Assessment & Plan:

## 2011-12-18 NOTE — Assessment & Plan Note (Signed)
Patient has been experiencing shortness of breath for about 4 weeks and taking albuterol to help with symptoms. Spirometry evaluation with Pre and Post Bronchodilator reveals normal lung function.  Patient states she feels that her breathing feels better after the albuterol nebulization. Of note, patient has been using her albuterol inhaler incorrectly.  She was counseled today on proper use.  Patient practiced using the proper technique.   Continue current treatment plan at this time.  Reviewed results of pulmonary function tests.  Pt verbalized understanding of results.  Written pt instructions provided.  F/U visit with Dr. Madolyn Frieze scheduled for today.  Total time in face to face counseling 40 minutes.  Patient seen with Tiney Rouge, PharmD Candidate and Lillia Pauls, PharmD, Pharmacy Resident.

## 2011-12-18 NOTE — Assessment & Plan Note (Signed)
Will refer to audiologist.

## 2011-12-18 NOTE — Progress Notes (Signed)
  Subjective:    Patient ID: Kristen Macias, female    DOB: 23-Nov-1933, 78 y.o.   MRN: [id number]  HPI Patient comes in today in fair spirits.  She has a history of COPD and is referred to Pharmacy Clinic by Dr. Madolyn Frieze for PFTs to evaluate lung function.  Patient states symptoms of shortness of breath began about 4 weeks ago.  She notes that in addition to difficulty breathing, she has wheezing that may occur when she walks to her mailbox or even throughout the house.  There are days she feels she go out and keep up with her friends, but not today.  Today, she notes that her breathing is a little worse as compared to other days. She reports using her albuterol inhaler about 6 times/day, but states that this only slightly helps.  Patient has a 48 year history of smoking cigarettes.  She quit for a period of about 10 years, then restarted after many people she knew passed away.  She smoked again for a period of about 5 years then quit again in April of 2012 after a hospitalization for COPD/bronchitis.  [place] [employer]        Review of Systems     Objective:   Physical Exam mMRC score= >2  See Documentation Flowsheet (discrete results - PFTs) for complete Spirometry results. Patient provided good effort while attempting spirometry.   Albuterol Neb  Lot# [id number]     Exp. March 2015      Assessment & Plan:  Patient has been experiencing shortness of breath for about 4 weeks and taking albuterol to help with symptoms. Spirometry evaluation with Pre and Post Bronchodilator reveals normal lung function.  Patient states she feels that her breathing feels better after the albuterol nebulization. Of note, patient has been using her albuterol inhaler incorrectly.  She was counseled today on proper use.  Patient practiced using the proper technique.   Continue current treatment plan at this time.  Reviewed results of pulmonary function tests.  Pt  verbalized understanding of results.  Written pt instructions provided.  F/U visit with Dr. Madolyn Frieze scheduled for today.  Total time in face to face counseling 40 minutes.  Patient seen with Tiney Rouge, PharmD Candidate and Lillia Pauls, PharmD, Pharmacy Resident. Marland Kitchen

## 2011-12-18 NOTE — Assessment & Plan Note (Signed)
Frequent PAC on previous recent ECG. She is asymptomatic from this which may be due to her drinking 2 pots of coffee daily. Given red flags.

## 2011-12-21 NOTE — Progress Notes (Signed)
Patient ID: Kristen Macias, female   DOB: 04-23-1933, 78 y.o.   MRN: [id number] Reviewed and agree with Dr. Macky Lower documentation and management.

## 2012-01-09 ENCOUNTER — Emergency Department (INDEPENDENT_AMBULATORY_CARE_PROVIDER_SITE_OTHER): Payer: Medicare HMO

## 2012-01-09 ENCOUNTER — Encounter (HOSPITAL_COMMUNITY): Payer: Self-pay | Admitting: Emergency Medicine

## 2012-01-09 ENCOUNTER — Emergency Department (INDEPENDENT_AMBULATORY_CARE_PROVIDER_SITE_OTHER)
Admission: EM | Admit: 2012-01-09 | Discharge: 2012-01-09 | Disposition: A | Discharge status: Home / Self Care | Payer: Medicare HMO | Source: Home / Self Care | Attending: Emergency Medicine | Admitting: Emergency Medicine

## 2012-01-09 DIAGNOSIS — S93409A Sprain of unspecified ligament of unspecified ankle, initial encounter: Secondary | ICD-10-CM

## 2012-01-09 MED ORDER — HYDROCODONE-ACETAMINOPHEN 5-325 MG PO TABS
ORAL_TABLET | ORAL | Status: AC
  Filled 2012-01-09: qty 1

## 2012-01-09 MED ORDER — TRAMADOL-ACETAMINOPHEN 37.5-325 MG PO TABS: 1.0000 | ORAL_TABLET | Freq: Four times a day (QID) | ORAL | Status: DC | PRN

## 2012-01-09 MED ORDER — IBUPROFEN 800 MG PO TABS
ORAL_TABLET | ORAL | Status: AC
  Filled 2012-01-09: qty 1

## 2012-01-09 MED ORDER — HYDROCODONE-ACETAMINOPHEN 5-325 MG PO TABS
1.0000 | ORAL_TABLET | Freq: Once | ORAL | Status: AC
  Administered 2012-01-09: 1 via ORAL

## 2012-01-09 MED ORDER — IBUPROFEN 800 MG PO TABS
800.0000 mg | ORAL_TABLET | Freq: Once | ORAL | Status: AC
  Administered 2012-01-09: 800 mg via ORAL

## 2012-01-09 NOTE — ED Notes (Signed)
Pt states that she slipped down steps today around 10:30 am injuring her left foot. Pain is felt lateral side of left foot. Pt has used ice and ace bandage with no relief of pain. Pain has gradually gotten worse.
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
## 2012-01-09 NOTE — ED Provider Notes (Signed)
Medical screening examination/treatment/procedure(s) were performed by non-physician practitioner and as supervising physician I was immediately available for consultation/collaboration.  Leslee Home, M.D.   Reuben Likes, MD 01/09/12 [phone]

## 2012-01-15 ENCOUNTER — Encounter: Payer: Self-pay | Admitting: Family Medicine

## 2012-01-15 ENCOUNTER — Ambulatory Visit (INDEPENDENT_AMBULATORY_CARE_PROVIDER_SITE_OTHER): Payer: Medicare HMO | Admitting: Family Medicine

## 2012-01-15 VITALS — BP 145/82 | HR 75 | Temp 98.4°F | Ht 63.0 in | Wt 186.0 lb

## 2012-01-15 DIAGNOSIS — R1310 Dysphagia, unspecified: Secondary | ICD-10-CM

## 2012-01-15 LAB — POCT H PYLORI SCREEN: H Pylori Screen, POC: NEGATIVE

## 2012-01-15 MED ORDER — OMEPRAZOLE 20 MG PO CPDR: 20.0000 mg | DELAYED_RELEASE_CAPSULE | Freq: Two times a day (BID) | ORAL | Status: DC

## 2012-01-15 NOTE — Assessment & Plan Note (Addendum)
We will refer to GI for worsening reflux symptoms and dysphagia. She has been dismissed from [facility] where she got her colonoscopy; cause unclear. We will try to schedule with [facility]. In the meantime, we will check H. Pylori (>>>negative) and try omeprazole twice daily.

## 2012-01-15 NOTE — Patient Instructions (Addendum)
We will refer you to [facility]  Increase prilosec to 1 tablet twice a day  Take Tylenol 650 mg every 12 hours (or twice a day) for the next week After that, you may take just as needed when you have bothersome pain

## 2012-01-15 NOTE — Progress Notes (Signed)
  Subjective:    Patient ID: Kristen Macias, female    DOB: 1933/08/10, 78 y.o.   MRN: [id number]  HPI # She reports her reflux has become severe and she is having difficult swallowing Over the past 2 weeks, she has had worsening reflux burning in her chest. At first it only occurred when she belched, but now it is there sometimes when she does not belch.  Prilosec in the morning before breakfast usually helped, but it has not been helping as well over the past couple of weeks. Neither is Zantac.  She eats a few times a day, small meals usually consisting of Lean Cuisine. She does not drink coffee, eat citrus/spicey foods. She denies chewing gum or drinking soda.  It does not get worse with activity.   She has also been complaining of food getting stuck in her throat. When she eats something like a Cheetoh, she feels like it gets stuck and has trouble eating more and sometimes throws it back up. Liquids are easier to keep down.   Review of Systems Denies blood in sputum Endorses decreased appetite Denies cough Denies constipation/diarrhea--controlled on Miralax once daily  Denies fevers/chills    Objective:   Physical Exam GEN: NAD; well-nourished, -appearing MOUTH: MMM, no oropharyngeal erythema or other lesions, no tonsillar adenopathy CV: RRR, normal S1/S2, no murmurs PULM: NI WOB; CTAB ABD: soft, NT, ND    Assessment & Plan:

## 2012-01-31 ENCOUNTER — Telehealth: Payer: Self-pay | Admitting: Family Medicine

## 2012-01-31 NOTE — Telephone Encounter (Signed)
I asked patient that referral to [facility] had been denied. She had been dismissed from [facility] due to not paying her bills.  I advised she settle her bill with [facility].  If she is unable to do this and if her symptoms are getting worse, then I asked her to get in touch with our office and let us know.

## 2012-03-29 ENCOUNTER — Telehealth: Payer: Self-pay | Admitting: Family Medicine

## 2012-03-29 NOTE — Telephone Encounter (Signed)
Saw patient 11/22 for dysphagia and referred her to GI, which was difficult to arrange since discharged from one practice. LVM asking patient if she had persistent symptoms and/or if she was able to see specialist.

## 2012-09-16 ENCOUNTER — Ambulatory Visit (INDEPENDENT_AMBULATORY_CARE_PROVIDER_SITE_OTHER): Payer: Medicare HMO | Admitting: Family Medicine

## 2012-09-16 ENCOUNTER — Ambulatory Visit (HOSPITAL_COMMUNITY)
Admission: RE | Admit: 2012-09-16 | Discharge: 2012-09-16 | Disposition: A | Discharge status: Home / Self Care | Payer: Medicare HMO | Source: Ambulatory Visit | Attending: Family Medicine | Admitting: Family Medicine

## 2012-09-16 ENCOUNTER — Encounter: Payer: Self-pay | Admitting: Family Medicine

## 2012-09-16 VITALS — BP 120/70 | HR 49 | Ht 63.0 in | Wt 193.6 lb

## 2012-09-16 DIAGNOSIS — M25552 Pain in left hip: Secondary | ICD-10-CM

## 2012-09-16 DIAGNOSIS — I491 Atrial premature depolarization: Secondary | ICD-10-CM | POA: Insufficient documentation

## 2012-09-16 DIAGNOSIS — J449 Chronic obstructive pulmonary disease, unspecified: Secondary | ICD-10-CM

## 2012-09-16 DIAGNOSIS — R001 Bradycardia, unspecified: Secondary | ICD-10-CM

## 2012-09-16 DIAGNOSIS — I499 Cardiac arrhythmia, unspecified: Secondary | ICD-10-CM

## 2012-09-16 DIAGNOSIS — M25551 Pain in right hip: Secondary | ICD-10-CM | POA: Insufficient documentation

## 2012-09-16 DIAGNOSIS — M25559 Pain in unspecified hip: Secondary | ICD-10-CM

## 2012-09-16 DIAGNOSIS — I959 Hypotension, unspecified: Secondary | ICD-10-CM

## 2012-09-16 DIAGNOSIS — M25569 Pain in unspecified knee: Secondary | ICD-10-CM

## 2012-09-16 DIAGNOSIS — I498 Other specified cardiac arrhythmias: Secondary | ICD-10-CM | POA: Insufficient documentation

## 2012-09-16 DIAGNOSIS — M25561 Pain in right knee: Secondary | ICD-10-CM | POA: Insufficient documentation

## 2012-09-16 LAB — BASIC METABOLIC PANEL
CO2: 22 mEq/L (ref 19–32)
Chloride: 104 mEq/L (ref 96–112)
Potassium: 4.1 mEq/L (ref 3.5–5.3)

## 2012-09-16 LAB — T4, FREE: Free T4: 1.12 ng/dL (ref 0.80–1.80)

## 2012-09-16 MED ORDER — ALBUTEROL SULFATE HFA 108 (90 BASE) MCG/ACT IN AERS: 2.0000 | INHALATION_SPRAY | Freq: Four times a day (QID) | RESPIRATORY_TRACT | Status: DC | PRN

## 2012-09-16 MED ORDER — KETOROLAC TROMETHAMINE 30 MG/ML IJ SOLN
30.0000 mg | Freq: Once | INTRAMUSCULAR | Status: AC
  Administered 2012-09-16: 30 mg via INTRAMUSCULAR

## 2012-09-16 NOTE — Assessment & Plan Note (Signed)
Joint space pain in R knee, worse with weight bearing and movement. Suspect DJD as cause.  Will start with standing bilateral and R knee complete films.  For acute pain, to give Toradol IM in office today, Tramadol 50mg  every 8 hours as needed for exquisite pain, until follow up with primary physician.  JB

## 2012-09-16 NOTE — Progress Notes (Signed)
Subjective:     Patient ID: Kristen Macias, female   DOB: Jun 11, 1933, 79 y.o.   MRN: [id number]  HPI Attending Note: Patient seen and examined by me with medical student Kristen Macias [facility] MS3, and I agree with his documentation.  Please see my separate attending note (under "Progress Notes" and "Problem LIst".) JB  This is a 79 yo woman w/ PMH of COPD, and irreg. HR presenting with right knee pain and swelling of 1 wk duration, and bradycardia.   Right Knee Pain  Pt's major concern for today's visit is right knee pain and swelling. Knee pain and swelling began 1 wk ago and has been worsening since. She describes the pain as sharp and rates it 10/10. Pain usually lasts for 5 min and resolves with rest. Occasionally, she has been awoken by pain at night upon stretching her lower extremity. Every time she moves her knee she experiences the pain (flexion, extension, internal/external rotation of hip, and flexion/extension of leg). She has tried Aleve (2 pills q4-5hrs) with no relief. She has not noted anything that makes the pain or swelling better. Pain radiates to hip and to foot along anterolateral aspect of leg. She states that the swelling is largely in her right knee and ankle and is present all day. Occasionally, her knee gets hot but she denies any erythema at the joint. Sensation in legs and feet bilaterally unchanged. Denies tingling or numbness in legs.   Bradycardia Pt came into office today and upon having vitals taken pulse was found to be 49. She denies chest pain, cough, and states that she only experiences SOB on exertion. She does not recall ever being told she has Aflutter/fib but says she encountered a [employer] nurse yesterday who mentioned to her that she had an irreg. HR that she may want to get checked.  Pt has no PMH or fam hx of gout or RA.  Pt states she needs an albuterol refill.   Review of Systems General: Denies fevers, chills, N/V, light-headedness, dizziness,   HEENT:  Endorses sharp head pain of 1 wk duration starting in temporal region and occasionally radiating from eye to occipital region of her head. She has had 3 episodes, each lasting 5 min. Called pt at home phone this afternoon (09/16/2012 @ 3:50 pm) regarding vision and hearing changes, jaw pain/claudication, or throat/tongue pain. Pt denies all of the above.   GI: Denies abdominal pain but states that her stomach is "swollen." Has been experiencing some GERD symptoms for which she has increased her omeprazole to 2 tabs and taken TUMS on occasion. Since 2 mos ago, she has needed to use Miralax to have BMs 2-3x/wk. She denies pain on defecation, blood in stool, tarry stools, or changes in color, freq, consistency of stools.   GU: No dysuria. No change in urinary color or  freq.   Rest of ROS as per HPI    Objective:   Physical Exam BP 120/70  Pulse 49  Ht 5\' 3"  (1.6 m)  Wt 193 lb 9.6 oz (87.816 kg)  BMI 34.3 kg/m2  SpO2 97%  General: NAD. Cooperative. A&Ox3. Speaks in full sentences and answers questions appropriately. Obese.  Eyes: PERRL. No scleral icterus or conjunctival pallor.   CV: Irregularly irregular rate. Normal S1 and S2 with premature heart sounds. No murmurs, or rubs. Radial pulses 2+ bilaterally. Dorsalis pedis 1+ right, 2+ left.    Pulm: All lung fields clear and equal to auscultation bilaterally.  Extremities/MSK: Strength 5/5  in lower extremities bilaterally. Palpation revealed tenderness in right joint space medial to the patella and in lower lumbar region on the right. Pt experiences pain upon plantar and dorsiflexion of right foot, flexion/extension of leg, and flexion/extension of hip localized largely at the knee joint. Pt experiences tingling when popliteal area is palpated. Right knee, ankle, and dorsum of foot swelling present. No swelling in left leg. No pitting edema present bilaterally. Sensation normal and equal bilaterally in lower extremities.   ECG 12-lead: sinus  rhythm. Irreg irreg rate at 69 bpm. No axis deviation. No ST elevation or depression. No ectopic q waves. Normal PR and QT intervals. Premature atrial contractions and rare PVCs present.   Problem List: 1. Knee pain and swelling 2. Bradycardia 3. Headache, GERD 4. Med Refill    Assessment:     This is a 79 yo woman w/ irreg. HR presenting and right knee pain and swelling consistent with degenerative arthritis.     Plan:     1. Knee pain and swelling - Pt's major concern and priority for today's visit is her knee pain and swelling. Hx and physical exam make degenerative arthritis a likely dx. Lack of erythema and fever make joint infxn, gout, rheumatoid arthropathy unlikely. Other items on the differential include sciatica and bursitis. Will order knee, lumbar, and hip X rays for further w/u. To manage pain acutely pt will be given Toradol shot 30 mg/mL IM. Also, Tramadol 50mg  tablets q8hrs as needed for pain. Pt should f/u with PCP, Dr. Benjamin Macias, once X rays are completed.  2. Bradycardia - Pt does not have ECG abnormalities consistent with Afib or Aflutter. Will continue to observe PACs. Pt should continue to f/u this issue w/ PCP to ensure no new cardiac arrhythmias.   3. Headache, GERD - Pt not overly concerned about these issues today. Have pt f/u w/ PCP if problems persist or worsen. Headache symptoms may be consistent w/ migraine, or cluster HA. Giant cell arteritis may be considered but given her lack of jaw claudication, changes in hearing vision, and the amount of concern expressed by the pt, GCA is not likely.   4. Med refill - Pt's albuterol refilled.

## 2012-09-16 NOTE — Assessment & Plan Note (Signed)
Requests refill of albuterol HFA for rescue use.  Not acutely dyspneic or needing rescue inhaler.  Refills sent in electronically.JB

## 2012-09-16 NOTE — Assessment & Plan Note (Signed)
Entirely asymptomatic with HR 40s in office; pulse oximetry around 98% on room air.  HR improved by time of ECG, which shows PVCs but no AF.  Will check TSH as part of today's labs. No other interventions at this time. She is not on nodal blocking agents that would explain her asymptomatic bradycardia. JB

## 2012-09-16 NOTE — Patient Instructions (Addendum)
It was a pleasure to see you today.   We are giving you a shot of medicine for pain.  Also, Tramadol 50mg  tablets, one every 8 hours as needed for pain.    I am ordering xrays of your knees and right hip.  Please follow with Dr Benjamin Stain, your new primary doctor, for this.

## 2012-09-16 NOTE — Progress Notes (Signed)
  Subjective:    Patient ID: Kristen Macias, female    DOB: 1933/05/12, 79 y.o.   MRN: [id number]  HPI Attending Note:  Patient seen and examined by me in [facility] along with medical student Donnal Moat, [facility] MS3, for complaint of worsening R knee pain that is worse with movement, weight bearing.  She has not had any trauma or falls, no redness or swelling of the knee or of other joints.  Has taken Aleve several times a day without relief.  She states that the pain appears contiguous with a similar pain in the right hip; radiates down to lower leg as well.  No associated weakness.  It is noted on her vital signs that her HR is in the 40s at time of rooming, and again during our check in the exam.   She denies prior history of dysrhythmia or AF, has had "skipped beats" in the past but denies any chest pain, dyspnea, palpitations or nausea.  She has not seen a cardiologist in recent times that she can recall. JB   Review of Systems  See HPI. No presyncope or syncope.      Objective:   Physical Exam Well appearing, No apparent distress HEENT Neck supple, no adenopathy.  COR initial check of pulse in mid-40s (asymptomatic), occasional irregularities. S1S2 PULM Clear bilaterally, no rales or wheezes.  EXT: Bilat knees without effusions.  Exquisite joint space tenderness along medial and lateral aspects of right knee.  Tenderness with passive and active flexion/extension of the R knee; no erythema on overlying skin.  SLR attempted, tolerated to aroudn 45 degrees.  R hip internal/ext rotation and ab/adduction are limited by pain.  No tenderness over greater trochanter (Right).  No edema of either feet; palpable dp pulses bilaterally, with sensation grossly intact in both feet.   ECG: NSR 70 with PVCs. JB       Assessment & Plan:

## 2012-09-19 ENCOUNTER — Encounter: Payer: Self-pay | Admitting: Family Medicine

## 2012-10-17 ENCOUNTER — Other Ambulatory Visit: Payer: Self-pay | Admitting: Orthopedic Surgery

## 2012-10-17 DIAGNOSIS — M25561 Pain in right knee: Secondary | ICD-10-CM

## 2012-10-17 DIAGNOSIS — R531 Weakness: Secondary | ICD-10-CM

## 2012-10-17 DIAGNOSIS — R609 Edema, unspecified: Secondary | ICD-10-CM

## 2012-10-22 ENCOUNTER — Ambulatory Visit
Admission: RE | Admit: 2012-10-22 | Discharge: 2012-10-22 | Disposition: A | Discharge status: Home / Self Care | Payer: Medicare HMO | Source: Ambulatory Visit | Attending: Orthopedic Surgery | Admitting: Orthopedic Surgery

## 2012-10-22 DIAGNOSIS — M25561 Pain in right knee: Secondary | ICD-10-CM

## 2012-10-22 DIAGNOSIS — R531 Weakness: Secondary | ICD-10-CM

## 2012-10-22 DIAGNOSIS — R609 Edema, unspecified: Secondary | ICD-10-CM

## 2012-11-30 ENCOUNTER — Ambulatory Visit (INDEPENDENT_AMBULATORY_CARE_PROVIDER_SITE_OTHER): Payer: Medicare HMO | Admitting: Podiatrist

## 2012-11-30 ENCOUNTER — Encounter: Payer: Self-pay | Admitting: Podiatrist

## 2012-11-30 VITALS — BP 164/89 | HR 64 | Temp 97.0°F | Resp 12 | Ht 63.0 in | Wt 198.0 lb

## 2012-11-30 DIAGNOSIS — L03039 Cellulitis of unspecified toe: Secondary | ICD-10-CM

## 2012-11-30 MED ORDER — HYDROCODONE-ACETAMINOPHEN 5-325 MG PO TABS: 2.0000 | ORAL_TABLET | ORAL | Status: DC | PRN

## 2012-11-30 NOTE — Patient Instructions (Signed)

## 2012-11-30 NOTE — Progress Notes (Signed)
  Subjective:    Patient ID: Kristen Macias, female    DOB: 1933/05/17, 79 y.o.   MRN: [id number]  HPI  patient presents today one week status post removal of ingrown toenails bilateral first. Left toenail was done a permanent fashion- total nail. Right toenail was done a total fashion medial and lateral nail borders courtesy of Dr. Al Corpus.  Patient states she's having severe pain she states she was prescribed cephalexin however she stopped taking it  As she doesn't think it is working. She relates she's had drainage and describes it as clear to yellow.    Review of Systems  Constitutional: Positive for fever.  Eyes: Negative.   Respiratory: Negative.   Cardiovascular: Positive for leg swelling.  Gastrointestinal: Negative.   Endocrine: Negative.   Genitourinary: Negative.   Musculoskeletal: Negative.   Skin: Positive for color change.  Allergic/Immunologic: Negative.   Hematological: Negative.   Psychiatric/Behavioral: Negative.        Objective:   Physical Exam:   Neurovascular status is intact with palpable pedal pulses and neurological sensation intact.  Right great toenail is loose at its base.  Patient relates it as painful. No pus or pirulence seen no streaking or lymphangitis seen.  Left great toenail appears to be healing normally.  No concern for infection present.  No malodor, or drainage.         Assessment & Plan:  S/p permanent phenol matrixectomy b/l.   Recommended removal of remainder of nail on the right great toe and this was accomplished today without complication after anesthetizing and prepping the toe in a sterile fashion.  The remainder of the nail plate was easily removed and antibiotic ointment and a dry dressing was applied.  Patient dispensed hydrocodone for pain and told to continue cephalexin until finished.  She is given post op instructions and will call if symptoms fail to improve

## 2013-05-18 ENCOUNTER — Encounter: Payer: Self-pay | Admitting: Family Medicine

## 2013-05-18 ENCOUNTER — Encounter: Payer: Self-pay | Admitting: Gastroenterology

## 2013-05-18 ENCOUNTER — Ambulatory Visit (INDEPENDENT_AMBULATORY_CARE_PROVIDER_SITE_OTHER): Payer: Medicare HMO | Admitting: Family Medicine

## 2013-05-18 ENCOUNTER — Ambulatory Visit (HOSPITAL_COMMUNITY)
Admission: RE | Admit: 2013-05-18 | Discharge: 2013-05-18 | Disposition: A | Discharge status: Home / Self Care | Payer: Medicare HMO | Source: Ambulatory Visit | Attending: Family Medicine | Admitting: Family Medicine

## 2013-05-18 ENCOUNTER — Other Ambulatory Visit: Payer: Self-pay | Admitting: Family Medicine

## 2013-05-18 VITALS — BP 114/78 | HR 63 | Temp 98.7°F | Ht 63.0 in | Wt 197.0 lb

## 2013-05-18 DIAGNOSIS — G2581 Restless legs syndrome: Secondary | ICD-10-CM

## 2013-05-18 DIAGNOSIS — R0600 Dyspnea, unspecified: Secondary | ICD-10-CM

## 2013-05-18 DIAGNOSIS — R0609 Other forms of dyspnea: Secondary | ICD-10-CM

## 2013-05-18 DIAGNOSIS — I82409 Acute embolism and thrombosis of unspecified deep veins of unspecified lower extremity: Secondary | ICD-10-CM

## 2013-05-18 DIAGNOSIS — R0989 Other specified symptoms and signs involving the circulatory and respiratory systems: Secondary | ICD-10-CM

## 2013-05-18 DIAGNOSIS — R1310 Dysphagia, unspecified: Secondary | ICD-10-CM

## 2013-05-18 DIAGNOSIS — K219 Gastro-esophageal reflux disease without esophagitis: Secondary | ICD-10-CM

## 2013-05-18 DIAGNOSIS — M79609 Pain in unspecified limb: Secondary | ICD-10-CM

## 2013-05-18 DIAGNOSIS — J449 Chronic obstructive pulmonary disease, unspecified: Secondary | ICD-10-CM

## 2013-05-18 DIAGNOSIS — K5900 Constipation, unspecified: Secondary | ICD-10-CM

## 2013-05-18 DIAGNOSIS — E669 Obesity, unspecified: Secondary | ICD-10-CM

## 2013-05-18 LAB — COMPREHENSIVE METABOLIC PANEL
ALT: 12 U/L (ref 0–35)
AST: 17 U/L (ref 0–37)
Albumin: 3.9 g/dL (ref 3.5–5.2)
Alkaline Phosphatase: 91 U/L (ref 39–117)
BUN: 12 mg/dL (ref 6–23)
CALCIUM: 9.2 mg/dL (ref 8.4–10.5)
CO2: 26 meq/L (ref 19–32)
CREATININE: 0.9 mg/dL (ref 0.50–1.10)
Chloride: 102 mEq/L (ref 96–112)
Glucose, Bld: 104 mg/dL — ABNORMAL HIGH (ref 70–99)
Potassium: 4.4 mEq/L (ref 3.5–5.3)
Sodium: 137 mEq/L (ref 135–145)
Total Bilirubin: 0.3 mg/dL (ref 0.2–1.2)
Total Protein: 7 g/dL (ref 6.0–8.3)

## 2013-05-18 LAB — CBC
HCT: 32 % — ABNORMAL LOW (ref 36.0–46.0)
Hemoglobin: 10.9 g/dL — ABNORMAL LOW (ref 12.0–15.0)
MCH: 25.2 pg — AB (ref 26.0–34.0)
MCHC: 34.1 g/dL (ref 30.0–36.0)
MCV: 73.9 fL — AB (ref 78.0–100.0)
Platelets: 309 10*3/uL (ref 150–400)
RBC: 4.33 MIL/uL (ref 3.87–5.11)
RDW: 15.3 % (ref 11.5–15.5)
WBC: 6.4 10*3/uL (ref 4.0–10.5)

## 2013-05-18 LAB — POCT GLYCOSYLATED HEMOGLOBIN (HGB A1C): HEMOGLOBIN A1C: 6.3

## 2013-05-18 LAB — TSH: TSH: 4.342 u[IU]/mL (ref 0.350–4.500)

## 2013-05-18 LAB — POCT H PYLORI SCREEN: H Pylori Screen, POC: NEGATIVE

## 2013-05-18 MED ORDER — ALBUTEROL SULFATE HFA 108 (90 BASE) MCG/ACT IN AERS: 2.0000 | INHALATION_SPRAY | Freq: Four times a day (QID) | RESPIRATORY_TRACT | Status: DC | PRN

## 2013-05-18 MED ORDER — RIVAROXABAN 15 MG PO TABS: 15.0000 mg | ORAL_TABLET | Freq: Two times a day (BID) | ORAL | Status: DC

## 2013-05-18 MED ORDER — POLYETHYLENE GLYCOL 3350 17 GM/SCOOP PO POWD: 17.0000 g | Freq: Every day | ORAL | Status: AC

## 2013-05-18 MED ORDER — OMEPRAZOLE 20 MG PO CPDR: 20.0000 mg | DELAYED_RELEASE_CAPSULE | Freq: Two times a day (BID) | ORAL | Status: DC

## 2013-05-18 MED ORDER — RIVAROXABAN 20 MG PO TABS: 20.0000 mg | ORAL_TABLET | Freq: Every day | ORAL | Status: DC

## 2013-05-18 NOTE — Progress Notes (Signed)
VASCULAR LAB PRELIMINARY  PRELIMINARY  PRELIMINARY  PRELIMINARY  Bilateral lower extremity venous duplex completed.    Preliminary report:  Right leg is negative for deep and superficial vein thrombosis.  There is a short segment of non-occluding thrombus in the distal common femoral/proximal femoral.  No other thrombosis noted in the deep or superficial system.  Easter Schinke, RVT 05/18/2013, 2:11 PM

## 2013-05-18 NOTE — Patient Instructions (Addendum)
It was good to meet you today.  For your leg pain: - We are getting a doppler to look for a clot. I will call you with this result. - Wear compression hose and elevate the leg. - If you develop fevers or worsened pain, seek immediate care. - Follow up with me about this in 2 weeks.  For your shortness of breath: - I am refilling albuterol, but it i simportant we look for other causes. - Get an echocardiogram. - We are getting labs today. I will call if any are NOT normal. - If your shortness of breath returns or you develop chest pain or dizziness, come right back or go to the ED. - Follow up with me in 2 weeks.  For your reflux: - We are getting an H pylori. - I am referring you to GI ([facility] should call you in a couple weeks).  Come back in 2 weeks so we can follow up on these issues.

## 2013-05-18 NOTE — Progress Notes (Signed)
Patient ID: Kristen Macias, female   DOB: Mar 26, 1933, 80 y.o.   MRN: [id number] Subjective:   CC: Medication refill, swollen leg  HPI:   Med refill - Patient wants the following meds refilled: - Albuterol - omeprazole  Shortness of breath - Patient has chronic dyspnea on exertion with her COPD. She never gets dizzy or syncopal. It is worse with cold weather, but she does not think she has allergies. Denies chest pain or current dyspnea. Takes albuterol TID, more if she exerts herself. Previous smoker, quit 3 years ago after smoking 30+ years. Does not report fever or chills.  Leg pain and swelling - Patient reports left lateral lower leg pain and swelling with darkening and "raised" appearance of veins. Symptoms present 1 week. No increase in baseline dyspnea, no chest pain, no h/o DVT/PE or atrial fibrillation (though has frequent PAC per hx). Is former smoker and has h/o RLS.  Reflux - Pt reports reflux hx and was put on omeprazole 20mg . Takes this 2-3 times daily, zantac 300-450mg  daily, and tums 1/2-1/3 bottle daily with no relief. Starting to have dysphagia over 5 years with solids, especially on right, and it is worsening so that she cannot eat crackers easily. Gets nauseated and vomits. Has never seen GI or had EGD per pt. Denies weight loss or night sweats. No FH of GI cancer. Pt has h/o diabetes.   Review of Systems - Per HPI.  PMH: Meds: - Not taking ca or iron or vit D because of constipation - removed from list - Not taking norco, ultracet or mobic - removed from list  Smoking status: Prior smoker, quit because of COPD exacerbation with pneumonia 3 years ago. Smoked 30 years+.    Objective:  Physical Exam BP 114/78  Pulse 63  Temp(Src) 98.7 F (37.1 C) (Oral)  Ht 5\' 3"  (1.6 m)  Wt 197 lb (89.359 kg)  BMI 34.91 kg/m2 GEN: NAD HEENT: Atraumatic, normocephalic, neck supple, EOMI, sclera clear  CV: RRR, no murmurs, rubs, or gallops, 2+ bilateral DP pulse PULM: CTAB, normal  effort SKIN: No rash or cyanosis; warm and well-perfused EXTR: LLE mildly swollen and erythematous and warm; no induration or cord, bilateral mild tenderness PSYCH: Mood and affect euthymic, normal rate and volume of speech NEURO: Awake, alert, no focal deficits grossly, normal speech    Assessment:     Kristen Macias is a 80 y.o. female here for eval of lower extremity swelling, GERD, and dysphagia.    Plan:   # Health Maintenance: Needs return visit for this. - A1c today since already drawing blood, per pt request.  Follow-up: Follow up in 2 weeks for f/u of leg swelling, dysphagia, and GERD.    Hilton Sinclair, MD [place]

## 2013-05-21 MED ORDER — SALMETEROL XINAFOATE 50 MCG/DOSE IN AEPB: 1.0000 | INHALATION_SPRAY | Freq: Two times a day (BID) | RESPIRATORY_TRACT | Status: DC

## 2013-05-21 NOTE — Assessment & Plan Note (Signed)
DDx includes DVT vs CHF vs venous insufficiency. No FH of cancer or h/o DVT/PE. - Compression hose recommended. - LE duplex ordered and told pt to get today>>>Pt with 1st episode of DVT - short segment of nonoccluding thrombus in distal common femoral / proximal femoral vein called in a few hours after encounter.     - Called patient and started xarelto 15mg  BID x 2 weeks followed by 20mg  daily. Discussed risk of bleeding. No h/o bleed.    - Asked pt to f/u in 2 weeks.    - REturn precautions for PE reviewed.    - Pt needs eval of secondary causes - CBC, CMET now. On return, discuss age-appropriate screening.    - Pt voiced understanding.     - Precepted with attending.    - With mild DOE in pt with COPD, could still be partial PE.

## 2013-05-21 NOTE — Assessment & Plan Note (Signed)
Worsening.  - Referral to GI for eval.

## 2013-05-21 NOTE — Assessment & Plan Note (Signed)
DDx includes PE, poorly-controlled COPD, or CHF. No wheezes or crackles on exam. - Continue albuterol with goal to decrease use. - REpeat echocardiogram. - EKG at f/u. Pt preferred not to do today. - BNP at f/u if crackles. - CBC to eval for anemia, TSH. - Start salmeterol inhaled BID.

## 2013-05-21 NOTE — Assessment & Plan Note (Signed)
Could be component of diabetic gastroparesis but concern for GI cancer given hx of dysphagia. H/o cancer. - Test h pylori. - GI referral placed for possible EGD given uncontrolled GERD and now dysphagia.

## 2013-05-21 NOTE — Assessment & Plan Note (Signed)
Using albuterol TID. No dyspnea currently. No wheezes on exam. No increased cough/sputum production. - Will add serevent inhaled BID.

## 2013-05-22 ENCOUNTER — Telehealth: Payer: Self-pay | Admitting: *Deleted

## 2013-05-22 NOTE — Telephone Encounter (Signed)
Received authorization from [employer] for patient's 2D Echo with contrast.  Called [facility] and scheduled pt for 06/01/2013 at 3 pm.  Attempted to call patient, LMVM for pt to please call back for appt time.  Keya Wynes, Loralyn Freshwater, [place]

## 2013-05-23 NOTE — Telephone Encounter (Signed)
Unable to reach pt by phone. Please call to have her set up appt to discuss issues, due to complexity of issues.  Anemia - Hgb 10.9 (10.5 1 year ago) with low MCV (73). Other cell lines normal. Recommend colonoscopy. Unable to find record of prior colonoscopy, but see 09/01/01 surg path of colon polyp with no dysplasia or malignancy. - Pt has [place] [facility] appt 07/07/13 for eval of dysphagia. - Would like her to inquire about getting colonoscopy.  Dyspnea - Discussed DVT at office visit. Already started xarelto, and pt's dyspnea was stable and she was comfortable in clinic with no tachycardia or chest pain. - Return precautions: Go to ED if develop acute worsening dyspnea for CTA. - CMET and TSH normal. - Needs colonoscopy and f/u visit to discuss other age-appropriate screening for possible cause of DVT (mammogram).  A1c - Pre-diabetic range at 6.3.  - Dietary management and exercise for weight loss for now. - At f/u can consider medication (metformin).  COPD -  - Given poor control, will start serevent (salmeterol) inhaler.   GERD - H pylori neg.    Hilton Sinclair, MD 05/23/2013 3:24 PM

## 2013-05-24 ENCOUNTER — Ambulatory Visit (INDEPENDENT_AMBULATORY_CARE_PROVIDER_SITE_OTHER): Payer: Commercial Managed Care - HMO | Admitting: Family Medicine

## 2013-05-24 ENCOUNTER — Encounter: Payer: Self-pay | Admitting: Family Medicine

## 2013-05-24 VITALS — BP 146/78 | HR 62 | Temp 98.6°F | Ht 63.0 in | Wt 199.0 lb

## 2013-05-24 DIAGNOSIS — R0989 Other specified symptoms and signs involving the circulatory and respiratory systems: Secondary | ICD-10-CM

## 2013-05-24 DIAGNOSIS — R0609 Other forms of dyspnea: Secondary | ICD-10-CM

## 2013-05-24 DIAGNOSIS — I82409 Acute embolism and thrombosis of unspecified deep veins of unspecified lower extremity: Secondary | ICD-10-CM

## 2013-05-24 DIAGNOSIS — D649 Anemia, unspecified: Secondary | ICD-10-CM

## 2013-05-24 DIAGNOSIS — R0600 Dyspnea, unspecified: Secondary | ICD-10-CM

## 2013-05-24 DIAGNOSIS — K219 Gastro-esophageal reflux disease without esophagitis: Secondary | ICD-10-CM

## 2013-05-24 DIAGNOSIS — J449 Chronic obstructive pulmonary disease, unspecified: Secondary | ICD-10-CM

## 2013-05-24 DIAGNOSIS — R7303 Prediabetes: Secondary | ICD-10-CM

## 2013-05-24 DIAGNOSIS — R7309 Other abnormal glucose: Secondary | ICD-10-CM

## 2013-05-24 NOTE — Telephone Encounter (Signed)
Appt today. Kristen Macias, Kristen Macias

## 2013-05-24 NOTE — Progress Notes (Signed)
Patient ID: Kristen Macias, female   DOB: [birth year], 80 y.o.   MRN: [id number] Subjective:   CC: F/u labs from last visit  HPl  Recent DVT - Patient started xarelto and reports leg pain is gone and redness receding. Denies worsened dyspnea, any chest pain, or any side effects from med. Overdue for colonoscopy (last in 2003 with non-malignant polyps). Denies bloody stool. Last mammogram 08/2011, normal per pt's memory. Last pap smear was normal per patient.  Prediabetes - Per A1c last check. Prefers to work on weight loss rather than start metformin. Plans to start walking daily. No CP or current dyspnea. Does not report polydypsia or polyuria.  Anemia - Denies vaginal or rectal bleeding. Denies weakness. Reports baseline dyspnea.  Follow-up of other issues:  Reflux - much improved with start of new medications (uncertain what pt means, as she was already taking omeprazole 20mg  BID and zantac 300-450mg  daily).  Dyspnea - Much improved with adding serevent BID. Was previously taking albuterol up to 3 times daily, has only needed 3 times this week. Denies chest pain.    Review of Systems - Per HPI.   Smoking status: former smoker  Return precautions reviewed    Objective:  Physical Exam BP 146/78  Pulse 62  Temp(Src) 98.6 F (37 C) (Oral)  Ht 5\' 3"  (1.6 m)  Wt 199 lb (90.266 kg)  BMI 35.26 kg/m2; 94% RA GEN: NAD, pleasant HEENT: Atraumatic, normocephalic, neck supple, EOMI, sclera clear  CV: RRR, no murmurs, rubs, or gallops PULM: CTAB, normal effort ABD: Soft, nontender, nondistended SKIN: No rash or cyanosis; warm and well-perfused EXTR: Trace bilateral LE edema, left leg mildly more erythematous below knee on anterior leg compared to right, nontender, no induration or cord PSYCH: Mood and affect euthymic, normal rate and volume of speech NEURO: Awake, alert, no focal deficits grossly, normal speech    Assessment:     Kristen Macias is a 80 y.o. female with h/o recent DVT dx  here for follow-up of last visit's labs.    Plan:   # Health Maintenance: Need to schedule this  Follow-up: Follow up in 2 weeks for f/u of multiple issues discussed today.  Hilton Sinclair, MD [place]

## 2013-05-24 NOTE — Patient Instructions (Addendum)
It was great to see you today.  You look great. Follow up with GI about your colonoscopy. Continue taking your medications. Come back to see me in 2 weeks so we can follow up. After that we can hopefully space out visits.  Best,  Hilton Sinclair, MD   Diet Recommendations for Diabetes   Starchy (carb) foods include: Bread, rice, pasta, potatoes, corn, crackers, bagels, muffins, all baked goods.  (Fruits, milk, and yogurt also have carbohydrate, but most of these foods will not spike your blood sugar as the starchy foods will.)  A few fruits do cause high blood sugars; use small portions of bananas (limit to 1/2 at a time), grapes, and most tropical fruits.    Protein foods include: Meat, fish, poultry, eggs, dairy foods, and beans such as pinto and kidney beans (beans also provide carbohydrate).   1. Eat at least 3 meals and 1-2 snacks per day. Never go more than 4-5 hours while awake without eating.  2. Limit starchy foods to TWO per meal and ONE per snack. ONE portion of a starchy  food is equal to the following:   - ONE slice of bread (or its equivalent, such as half of a hamburger bun).   - 1/2 cup of a "scoopable" starchy food such as potatoes or rice.   - 15 grams of carbohydrate as shown on food label.  3. Both lunch and dinner should include a protein food, a carb food, and vegetables.   - Obtain twice as many veg's as protein or carbohydrate foods for both lunch and dinner.   - Fresh or frozen veg's are best.   - Try to keep frozen veg's on hand for a quick vegetable serving.    4. Breakfast should always include protein.

## 2013-05-27 DIAGNOSIS — D649 Anemia, unspecified: Secondary | ICD-10-CM | POA: Insufficient documentation

## 2013-05-27 DIAGNOSIS — R7303 Prediabetes: Secondary | ICD-10-CM | POA: Insufficient documentation

## 2013-05-27 NOTE — Assessment & Plan Note (Addendum)
Added serevent BID with improvement in albuterol use. No crackles on exam. O2 sat normal. - Continue serevent.

## 2013-05-27 NOTE — Assessment & Plan Note (Signed)
Added serevent BID with improvement in albuterol use. No crackles on exam. O2 sat normal. - Continue serevent. - F/u echocardiogram for dyspnea (not scheduled yet) - No EKG today given patient's has minimal dyspnea and no chest pain. - Return precautions reviewed.

## 2013-05-27 NOTE — Assessment & Plan Note (Signed)
Improved, though uncertain why. - Continue omeprazole 20mg  BID. - Await GI appt for eval of dysphagia (also improved since last visit).

## 2013-05-27 NOTE — Assessment & Plan Note (Signed)
Hgb 10.9 (10.5 1 year ago) with low MCV (73). Other cell lines normal.  - Pt to discuss colonoscopy with GI at upcoming May appt. - If becomes symptomatic, can discuss starting iron.

## 2013-05-27 NOTE — Assessment & Plan Note (Addendum)
Taking xarelto BID with no side effects and reported improvement. Left leg appears less swollen and red. - Continue xarelto 15mg  BID for now, after 3 weeks total switch to 20mg  daily. Reviewed how to take this. - Age-appropriate screening to look for causes of DVT:  ---overdue for colonoscopy. Last in 2003 with non-malignant polyps. Referred to GI last visit. ---Reports last mammogram 08/2011, normal. >70 so will not repeat. ---Reports last pap normal per pt (unable to find). >65 so will not repeat. ---CBC and CMET last visit WNL other than anemia. - Return precautions reviewed. - Precepted with Dr Gwendlyn Deutscher.

## 2013-05-27 NOTE — Assessment & Plan Note (Signed)
A1c 6.3. At this time, prefers to work on weight loss rather than starting metformin. - Plans to start walking daily. - Provided general diabetes diet education. - Recheck A1c in 6 mo.

## 2013-05-29 NOTE — Telephone Encounter (Signed)
LM for pt to call back.  Please inform of echo appt 06-01-13 when she does.  Thanks [employer]

## 2013-05-29 NOTE — Telephone Encounter (Signed)
Please call pt to be sure she knows about echocardiogram, scheduled for 06/01/13 at 3pm at [facility], for which she has been authorized by [employer].  Hilton Sinclair, MD

## 2013-06-01 ENCOUNTER — Ambulatory Visit (HOSPITAL_COMMUNITY)
Admission: RE | Admit: 2013-06-01 | Discharge: 2013-06-01 | Disposition: A | Discharge status: Home / Self Care | Payer: Medicare HMO | Source: Ambulatory Visit | Attending: Family Medicine | Admitting: Family Medicine

## 2013-06-01 ENCOUNTER — Ambulatory Visit: Payer: Medicare HMO | Admitting: Family Medicine

## 2013-06-01 DIAGNOSIS — R0609 Other forms of dyspnea: Secondary | ICD-10-CM | POA: Diagnosis present

## 2013-06-01 DIAGNOSIS — R0989 Other specified symptoms and signs involving the circulatory and respiratory systems: Secondary | ICD-10-CM

## 2013-06-01 DIAGNOSIS — J449 Chronic obstructive pulmonary disease, unspecified: Secondary | ICD-10-CM | POA: Diagnosis not present

## 2013-06-01 DIAGNOSIS — J4489 Other specified chronic obstructive pulmonary disease: Secondary | ICD-10-CM | POA: Insufficient documentation

## 2013-06-01 DIAGNOSIS — R0600 Dyspnea, unspecified: Secondary | ICD-10-CM

## 2013-06-01 NOTE — Progress Notes (Signed)
Echo Lab  2D Echocardiogram completed.  Brandon, RDCS 06/01/2013 3:16 PM

## 2013-06-14 ENCOUNTER — Encounter: Payer: Self-pay | Admitting: Family Medicine

## 2013-06-14 ENCOUNTER — Ambulatory Visit (INDEPENDENT_AMBULATORY_CARE_PROVIDER_SITE_OTHER): Payer: Commercial Managed Care - HMO | Admitting: Family Medicine

## 2013-06-14 VITALS — BP 129/79 | HR 59 | Temp 97.6°F | Ht 63.0 in | Wt 200.2 lb

## 2013-06-14 DIAGNOSIS — R0609 Other forms of dyspnea: Secondary | ICD-10-CM

## 2013-06-14 DIAGNOSIS — K219 Gastro-esophageal reflux disease without esophagitis: Secondary | ICD-10-CM

## 2013-06-14 DIAGNOSIS — J449 Chronic obstructive pulmonary disease, unspecified: Secondary | ICD-10-CM

## 2013-06-14 DIAGNOSIS — R0600 Dyspnea, unspecified: Secondary | ICD-10-CM

## 2013-06-14 DIAGNOSIS — R0989 Other specified symptoms and signs involving the circulatory and respiratory systems: Secondary | ICD-10-CM

## 2013-06-14 DIAGNOSIS — I82409 Acute embolism and thrombosis of unspecified deep veins of unspecified lower extremity: Secondary | ICD-10-CM

## 2013-06-14 MED ORDER — SALMETEROL XINAFOATE 50 MCG/DOSE IN AEPB: 1.0000 | INHALATION_SPRAY | Freq: Two times a day (BID) | RESPIRATORY_TRACT | Status: DC

## 2013-06-14 NOTE — Assessment & Plan Note (Signed)
Resolved. Completed omeprazole treatment. Still dysphagia for solids. - Monitor off omeprazole. - If heartburn symptoms return, start zantac. If no improvement after 1 week, call and we can restart omeprazole.  - Referred to GI previously and appt is next month.

## 2013-06-14 NOTE — Assessment & Plan Note (Signed)
Seemingly unprovoked. Symptoms improved/ resolved. - Continue for total 6 months therapy in seemingly unprovoked DVT, then reassess benefit vs bleeding risk. - has [place] appt for colonoscopy and other f/u for severe reflux (though improved) bc dysphagia with solids. - Age-appropriate recommendations: No longer needs mammogram or pap smear.

## 2013-06-14 NOTE — Progress Notes (Signed)
Patient ID: Kristen Macias, female   DOB: Apr 19, 1933, 80 y.o.   MRN: [id number]  Subjective:   CC: F/u multiple issues  HPl  Recent DVT - Started xarelto 20mg  daily a few days ago and is not having any issues with it. No bloody stool or hematuria. Symptoms of leg pain, swelling, redness completely gone. Denies worsened dyspnea or chest pain.  Reflux - Resolved with omeprazole. Has one more day of medication. Prior to omeprazole, was taking zantac and multiple tums daily.   Dyspnea - Echo 4/9 was normal. No more DVT symptoms. Dyspnea improved. Still uses albuterol twice daily (down from previously), worsened by weather. Does not use serevent because did not seem to understand reason for it or importance, though we discussed at last visit. Has it at home. Denies chest pain.  Review of Systems - Per HPI.   Smoking status: former smoker  Return precautions reviewed    Objective:  Physical Exam BP 129/79  Pulse 59  Temp(Src) 97.6 F (36.4 C) (Oral)  Ht 5\' 3"  (1.6 m)  Wt 200 lb 3.2 oz (90.81 kg)  BMI 35.47 kg/m2; 94% RA GEN: NAD, pleasant HEENT: Atraumatic, normocephalic, neck supple, EOMI, sclera clear  PULM: normal effort ABD: Soft, nontender, nondistended SKIN: No rash or cyanosis; warm and well-perfused EXTR: Trace bilateral LE edema, nontender, no induration or cord, no erythema today, compression hose in place; right hand missing last 2 digits PSYCH: Mood and affect euthymic, normal rate and volume of speech NEURO: Awake, alert, no focal deficits grossly, normal speech    Assessment:     Kristen Macias is a 80 y.o. female with h/o recent DVT dx here for follow-up of last visit's labs.    Plan:   # Health Maintenance: Need to schedule this  Recent DVT - Seemingly unprovoked. Symptoms improved/ resolved. - Continue for total 6 months therapy in seemingly unprovoked DVT, then reassess benefit vs bleeding risk. - has [place] appt for colonoscopy and other f/u for severe reflux  (though improved) bc dysphagia with solids. - Age-appropriate recommendations: No longer needs mammogram or pap smear.  Reflux - Resolved. Completed omeprazole treatment. Still dysphagia for solids. - Monitor off omeprazole. - If heartburn symptoms return, start zantac. If no improvement after 1 week, call and we can restart omeprazole.  - Referred to GI previously and appt is next month.   Dyspnea - Improved and normal Echo 4/9 but still using albuterol too much and has not started serevent. - Has cards appt mid-May. Will call me if copay is too high. Going to call office to find out.  - Reiterated serevent bid every day. Refilled and informed of refills available. - Has albuterol and suspect use will decrease. - F/u 2 weeks  At future visit: - F/u prediabetes. Pt is walking daily. - F/u anemia. No symptoms last visit other than baseline dyspnea.  Follow-up: Follow up in 2 weeks for f/u of breathing.  Hilton Sinclair, MD [place]

## 2013-06-14 NOTE — Patient Instructions (Signed)
It was good to see you!  For your breathing,  - Use serevent twice daily, every day. I will refill this. - Follow up with me in 2 weeks. - Let me know if the cardiologist is too expensive.  For your difficulty swallowing solids, - Follow up with gastroenterology. - If your reflux comes back and zantac does not help, call and we can restart omeprazole.  For your DVT,  - It looks lots better. Keep taking xarelto 20mg  daily. - Follow up with gastroenterolgy for your colonoscopy to make sure there is no cancer.  Best,  Hilton Sinclair, MD

## 2013-06-14 NOTE — Assessment & Plan Note (Signed)
Improved and normal Echo 4/9 but still using albuterol too much and has not started serevent. - Has cards appt mid-May. Will call me if copay is too high. Going to call office to find out.  - Reiterated serevent bid every day. Refilled and informed of refills available. - Has albuterol and suspect use will decrease. - F/u 2 weeks

## 2013-06-22 ENCOUNTER — Telehealth: Payer: Self-pay | Admitting: Family Medicine

## 2013-06-22 DIAGNOSIS — R1310 Dysphagia, unspecified: Secondary | ICD-10-CM

## 2013-06-22 DIAGNOSIS — G2581 Restless legs syndrome: Secondary | ICD-10-CM

## 2013-06-22 NOTE — Telephone Encounter (Signed)
Pt called to tell Dr. Dianah Field that her acid reflux came back and that she would call in her some medication for that. jw

## 2013-06-23 ENCOUNTER — Ambulatory Visit: Payer: Commercial Managed Care - HMO | Admitting: Family Medicine

## 2013-06-24 MED ORDER — OMEPRAZOLE 20 MG PO CPDR: 20.0000 mg | DELAYED_RELEASE_CAPSULE | Freq: Two times a day (BID) | ORAL | Status: DC

## 2013-06-24 NOTE — Telephone Encounter (Signed)
Sent in omeprazole for 2 month treatment after which we can re-evaluate.  Please call and let patient know.  Hilton Sinclair, MD

## 2013-06-26 NOTE — Telephone Encounter (Signed)
Lm for patient that medication was called in. [facility]

## 2013-07-07 ENCOUNTER — Ambulatory Visit: Payer: Medicare HMO | Admitting: Gastroenterology

## 2013-07-13 ENCOUNTER — Ambulatory Visit: Payer: Commercial Managed Care - HMO | Admitting: Family Medicine

## 2013-08-30 ENCOUNTER — Telehealth: Payer: Self-pay | Admitting: Family Medicine

## 2013-08-30 DIAGNOSIS — R1310 Dysphagia, unspecified: Secondary | ICD-10-CM

## 2013-08-30 DIAGNOSIS — G2581 Restless legs syndrome: Secondary | ICD-10-CM

## 2013-08-30 NOTE — Telephone Encounter (Signed)
Pt called and needs a refill on her Prilosec sent into the pharmacy. She also wanted Dr. Darene Lamer to know that her car has broken down and this why she had to cancel her appointment. jw

## 2013-08-31 ENCOUNTER — Ambulatory Visit: Payer: Commercial Managed Care - HMO | Admitting: Family Medicine

## 2013-08-31 MED ORDER — OMEPRAZOLE 20 MG PO CPDR: 20.0000 mg | DELAYED_RELEASE_CAPSULE | Freq: Two times a day (BID) | ORAL | Status: DC

## 2013-08-31 NOTE — Telephone Encounter (Signed)
Refill sent and asked patient on rx to f/u with PCP. At f/u, we can discuss if she was seen by GI as referred.  Hilton Sinclair, MD

## 2013-09-24 ENCOUNTER — Other Ambulatory Visit: Payer: Self-pay | Admitting: Family Medicine

## 2013-09-25 NOTE — Telephone Encounter (Signed)
attempted to call.  Phone seems to be disconnected. [place], Salome Spotted

## 2013-09-25 NOTE — Telephone Encounter (Signed)
Refilling but patient needs appt within the month to follow up on dyspnea. Hilton Sinclair, MD

## 2013-11-23 ENCOUNTER — Other Ambulatory Visit: Payer: Self-pay | Admitting: Family Medicine

## 2013-12-25 ENCOUNTER — Encounter: Payer: Self-pay | Admitting: Family Medicine

## 2014-02-15 ENCOUNTER — Other Ambulatory Visit: Payer: Self-pay | Admitting: Family Medicine

## 2014-05-16 ENCOUNTER — Other Ambulatory Visit: Payer: Self-pay | Admitting: Family Medicine

## 2014-05-23 ENCOUNTER — Other Ambulatory Visit: Payer: Self-pay

## 2014-05-23 NOTE — Patient Outreach (Signed)
[facility]) Care Management  05/23/2014  Kristen Macias April 01, 1933 [id number]   SUBJECTIVE:  Telephone call to patient regarding [facility] delegation outreach.  HIPAA confirmed with patient.  Discussed and offered [facility] care management services to patient. Patient declined [facility] services.  Patient states she is doing very well.  States she has a cataract on her left eye.  Patient states she was referred by her eye doctor to specialist for possible cataract surgery but states she could not be seen until she receives a referral from her primary MD.  Patient denies any recent ED or hospitalizations.  Patient denies falls.  Patient states her breathing is much better.  Patient confirms she has COPD and states she was diagnosed approximately 4 years ago.  Patient verbalizes understanding of signs and symptoms of COPD and when to contact doctor to be seen. Patient denies need for any further education regarding COPD at this time.  Patient reports she does have an inhaler for her COPD  Patient reports her [employer] card has the wrong primary provider listed.    ASSESSMENT:  RNCM explained to patient referral process to see specialist.  Explained she had to obtain referral from her primary MD per her insurance in order to see a specialist.  RNCM advised patient to call the customer service number on her [employer] card to notify them of her correct primary provider.  Patient verbalized understanding.  RNCM advised patient to contact her primary MD office for appointment to obtain referral regarding her cataract. Patient verbalized understanding.  RNCM verified primary care provider and [employer] ID with patient.   PLAN:  RNCM will refer patient to [facility] to close due to refusal of services. RNCM will have outreach letter/ pamphlet sent to patient for future needs. RNCM will notify patients primary care provider of refusal of services.   Quinn Plowman RN,BSN,CCM [place]  Coordinator [phone]

## 2014-05-29 NOTE — Patient Outreach (Signed)
[facility]) Care Management  05/29/2014  TEIA FREITAS [birth year] [id number]   Received notification from Quinn Plowman, RN to close case due to patient refused services.  Case closed. Ronnell Freshwater. [place] CM Assistant Phone: [phone] Fax: [phone]

## 2014-06-14 ENCOUNTER — Other Ambulatory Visit: Payer: Self-pay | Admitting: Family Medicine

## 2014-06-14 ENCOUNTER — Encounter: Payer: Commercial Managed Care - HMO | Admitting: Family Medicine

## 2014-07-16 ENCOUNTER — Other Ambulatory Visit: Payer: Self-pay | Admitting: Family Medicine

## 2014-07-30 ENCOUNTER — Telehealth: Payer: Self-pay | Admitting: Family Medicine

## 2014-07-30 NOTE — Telephone Encounter (Signed)
Spoke with patient and informed her that she would need an appt.  Scheduled for Thursday 08-02-14. Jazmin Hartsell,CMA

## 2014-07-30 NOTE — Telephone Encounter (Signed)
Pt called because her eye doctor referred her to [facility] to remove her cataracts. They would not see her until they have a referral. Please let patient know when this is ready. jw

## 2014-08-02 ENCOUNTER — Encounter: Payer: Self-pay | Admitting: Family Medicine

## 2014-08-02 ENCOUNTER — Ambulatory Visit (INDEPENDENT_AMBULATORY_CARE_PROVIDER_SITE_OTHER): Payer: Commercial Managed Care - HMO | Admitting: Family Medicine

## 2014-08-02 ENCOUNTER — Telehealth: Payer: Self-pay | Admitting: Family Medicine

## 2014-08-02 VITALS — BP 124/61 | HR 60 | Temp 98.3°F | Ht 63.0 in | Wt 188.0 lb

## 2014-08-02 DIAGNOSIS — R1900 Intra-abdominal and pelvic swelling, mass and lump, unspecified site: Secondary | ICD-10-CM | POA: Diagnosis not present

## 2014-08-02 DIAGNOSIS — G2581 Restless legs syndrome: Secondary | ICD-10-CM | POA: Diagnosis not present

## 2014-08-02 DIAGNOSIS — R7303 Prediabetes: Secondary | ICD-10-CM

## 2014-08-02 DIAGNOSIS — R1310 Dysphagia, unspecified: Secondary | ICD-10-CM

## 2014-08-02 DIAGNOSIS — R7309 Other abnormal glucose: Secondary | ICD-10-CM

## 2014-08-02 DIAGNOSIS — H269 Unspecified cataract: Secondary | ICD-10-CM | POA: Diagnosis not present

## 2014-08-02 LAB — LIPID PANEL
CHOL/HDL RATIO: 6.6 ratio
Cholesterol: 239 mg/dL — ABNORMAL HIGH (ref 0–200)
HDL: 36 mg/dL — ABNORMAL LOW (ref >=46)
LDL Cholesterol: 149 mg/dL — ABNORMAL HIGH (ref 0–99)
TRIGLYCERIDES: 271 mg/dL — AB (ref <150)
VLDL: 54 mg/dL — ABNORMAL HIGH (ref 0–40)

## 2014-08-02 LAB — GLUCOSE, CAPILLARY: Glucose-Capillary: 94 mg/dL (ref 65–99)

## 2014-08-02 LAB — COMPREHENSIVE METABOLIC PANEL
ALK PHOS: 100 U/L (ref 39–117)
ALT: 13 U/L (ref 0–35)
AST: 21 U/L (ref 0–37)
Albumin: 4 g/dL (ref 3.5–5.2)
BUN: 11 mg/dL (ref 6–23)
CALCIUM: 9.9 mg/dL (ref 8.4–10.5)
CHLORIDE: 103 meq/L (ref 96–112)
CO2: 23 mEq/L (ref 19–32)
Creat: 1 mg/dL (ref 0.50–1.10)
GLUCOSE: 99 mg/dL (ref 70–99)
Potassium: 4.2 mEq/L (ref 3.5–5.3)
Sodium: 136 mEq/L (ref 135–145)
Total Bilirubin: 0.5 mg/dL (ref 0.2–1.2)
Total Protein: 7.6 g/dL (ref 6.0–8.3)

## 2014-08-02 LAB — CBC
HCT: 32.7 % — ABNORMAL LOW (ref 36.0–46.0)
HEMOGLOBIN: 10.3 g/dL — AB (ref 12.0–15.0)
MCH: 21.7 pg — ABNORMAL LOW (ref 26.0–34.0)
MCHC: 31.5 g/dL (ref 30.0–36.0)
MCV: 68.8 fL — ABNORMAL LOW (ref 78.0–100.0)
MPV: 9.4 fL (ref 8.6–12.4)
PLATELETS: 330 10*3/uL (ref 150–400)
RBC: 4.75 MIL/uL (ref 3.87–5.11)
RDW: 17 % — ABNORMAL HIGH (ref 11.5–15.5)
WBC: 6.8 10*3/uL (ref 4.0–10.5)

## 2014-08-02 LAB — TSH: TSH: 3.611 u[IU]/mL (ref 0.350–4.500)

## 2014-08-02 LAB — POCT GLYCOSYLATED HEMOGLOBIN (HGB A1C): Hemoglobin A1C: 6

## 2014-08-02 MED ORDER — ROPINIROLE HCL 0.5 MG PO TABS: ORAL_TABLET | ORAL | Status: DC

## 2014-08-02 NOTE — Assessment & Plan Note (Signed)
Started 2 months ago, intermittent. Normal speech. -Unfortunately, patient brought this up at the end of the visit. Asked her to eat soft foods and follow-up with my next available appointment for evaluation. Will likely need barium swallow/evaluation from SLP. Of note, had referred to GI 04/2013 for this but unclear if she was seen.

## 2014-08-02 NOTE — Progress Notes (Signed)
Patient ID: Kristen Macias, female   DOB: 12-Apr-1933, 81 y.o.   MRN: [id number] Subjective:   CC: Ophthalmology referral, other complaints  HPI:   Cataracts Patient was seen by her optometrist who suggested she see an ophthalmologist for cataract removal. Worse in her left eye. She denies history of any heart disease, MI, or renal disease. She would like this referral.  Restless leg symptoms  She has had worsening of her restless leg symptoms bilaterally since running out of her Requip and not asking for refill. It had helped in the past. Pain is sharp and burning, worse at night, keeps her awake, feeling like a cramp in her bilateral anterior lower legs. She denies any swelling, fevers, chills, or dyspnea.  Constipation She states that she has bouts of constipation, but has not had a bowel movement in the past 1.5 weeks. She is passing gas. She was prescribed something in the past that works. She has been taking MiraLAX twice daily and has been trying to eat vegetables and fruits. Not having severe abdominal pain or vomiting.  Choking on food She states that for the past 2 months, she has intermittently had trouble choking on certain solid foods. She chews her foods very well.  [employer] paperwork  She brings an annual paperwork from [facility] requesting lipids, A1c, and blood sugar.    Review of Systems - Per HPI.   PMH: Prediabetes, history of tobacco abuse, GERD, COPD, irregular heart rhythm, history of DVT    Objective:  Physical Exam BP 124/61 mmHg  Pulse 60  Temp(Src) 98.3 F (36.8 C) (Oral)  Ht 5\' 3"  (1.6 m)  Wt 188 lb (85.276 kg)  BMI 33.31 kg/m2 GEN: NAD, very pleasant Cardiovascular: Regular rate and rhythm, no murmurs rubs or gallops, 2+ bilateral dorsalis pedis pulses and radial pulses Pulmonary: Clear to auscultation bilaterally, normal effort Abdomen: Soft, nontender, and nondistended, NABS Extremities: No lower extremity edema or calf tenderness but mild tibial  tenderness bilaterally with no skin changes HEENT: Atraumatic, normocephalic, sclera clear, extraocular movements intact, bilateral arcus senilis    Assessment:     Kristen Macias is a 81 y.o. female here for referral to ophthalmology and with other complaints.    Plan:     # See problem list and after visit summary for problem-specific plans.  Follow-up: Follow up when convenient in the very near future for evaluation of dysphagia. Also for f/u of prediabetes. F/u 2 weeks if no improvement in RLS symptoms.   Hilton Sinclair, MD [place]

## 2014-08-02 NOTE — Assessment & Plan Note (Signed)
Symptoms worsened since running out of Requip, which had helped in the past. No signs or symptoms of DVT. -Refilled Requip, asked patient to remain on lowest dose that helps symptoms. Caused nausea in the past so if this occurs, can consider other tx. -TSH, CMET, CBC to evaluate other causes of restless leg symptoms -If no improvement, could consider gabapentin

## 2014-08-02 NOTE — Assessment & Plan Note (Signed)
She brings an annual paperwork from [facility] requesting lipids, A1c, and blood sugar.  -Labwork collected. Asked patient to make follow-up appointment to discuss prediabetes.

## 2014-08-02 NOTE — Telephone Encounter (Signed)
A1c 6 (prediabetic) but blood sugar normal. Will await other labwork prior to contacting patient.  Hilton Sinclair, MD

## 2014-08-02 NOTE — Assessment & Plan Note (Signed)
Constipated with no bowel movement in 1.5 weeks. Trialing MiraLAX twice daily. Passing gas. NABS. -Increase to 3 times daily MiraLAX and add Metamucil once a day. -Call if still no improvement in a few days to one week. -Seek immediate care if stops passing gas, starts vomiting, severe abdominal pain, or other concerns.

## 2014-08-02 NOTE — Assessment & Plan Note (Signed)
Optometrist diagnosed her with cataracts. Patient would like referral to ophthalmologist. -Referral placed for ophthalmologist, patient preference is [facility] eye surgical.

## 2014-08-02 NOTE — Patient Instructions (Signed)
Restart the Requip every day. You may increase according to the instructions. If symptoms are improved at a low dose, continue at this dose.  We are getting lab work today and I will call you if it is not normal. Please follow up when convenient to discuss your prediabetes.  Please also come in when convenient to discuss your recent difficulty with swallowing. Until then, try to eat softer foods.  For your constipation, increase MiraLAX to 3 times daily for 2-3 days. Also add one cup of dissolved Metamucil every day. Continue eating foods high in fiber. Call in 1 week if symptoms are not significantly improved or immediately if you're having severe pain.  I placed a referral for ophthalmology. Call us if you do not hear anything in 2 weeks.   Hilton Sinclair, MD

## 2014-08-13 DIAGNOSIS — H25011 Cortical age-related cataract, right eye: Secondary | ICD-10-CM | POA: Diagnosis not present

## 2014-08-13 DIAGNOSIS — H2511 Age-related nuclear cataract, right eye: Secondary | ICD-10-CM | POA: Diagnosis not present

## 2014-08-13 DIAGNOSIS — H02839 Dermatochalasis of unspecified eye, unspecified eyelid: Secondary | ICD-10-CM | POA: Diagnosis not present

## 2014-08-13 DIAGNOSIS — H00029 Hordeolum internum unspecified eye, unspecified eyelid: Secondary | ICD-10-CM | POA: Diagnosis not present

## 2014-08-14 ENCOUNTER — Encounter: Payer: Self-pay | Admitting: Family Medicine

## 2014-08-14 ENCOUNTER — Ambulatory Visit (INDEPENDENT_AMBULATORY_CARE_PROVIDER_SITE_OTHER): Payer: Commercial Managed Care - HMO | Admitting: Family Medicine

## 2014-08-14 VITALS — BP 133/96 | HR 71 | Temp 98.3°F | Ht 63.0 in | Wt 187.0 lb

## 2014-08-14 DIAGNOSIS — R7309 Other abnormal glucose: Secondary | ICD-10-CM | POA: Diagnosis not present

## 2014-08-14 DIAGNOSIS — R1310 Dysphagia, unspecified: Secondary | ICD-10-CM | POA: Diagnosis not present

## 2014-08-14 DIAGNOSIS — G2581 Restless legs syndrome: Secondary | ICD-10-CM | POA: Diagnosis not present

## 2014-08-14 DIAGNOSIS — R7303 Prediabetes: Secondary | ICD-10-CM

## 2014-08-14 DIAGNOSIS — E785 Hyperlipidemia, unspecified: Secondary | ICD-10-CM | POA: Diagnosis not present

## 2014-08-14 MED ORDER — ATORVASTATIN CALCIUM 40 MG PO TABS: 40.0000 mg | ORAL_TABLET | Freq: Every day | ORAL | Status: DC

## 2014-08-14 NOTE — Patient Instructions (Signed)
Great job with diet and exercise changes. Please keep it up! Also start the cholesterol medicine every day to keep your risk of stroke and heart attack as low as possible. We will recheck cholesterol in about 6 months.. Please return in 3 months to follow up on your A1c. When you are able to get the swallowing evaluated, make an appointment with [place] so we can arrange this for you. Sooner is better if possible.  Hilton Sinclair, MD

## 2014-08-14 NOTE — Progress Notes (Signed)
Patient ID: Kristen Macias, female   DOB: 1933-08-16, 81 y.o.   MRN: [id number] Subjective:   CC: Follow up  HPI:   Follow up prediabetes She states that she walks twice daily (10 min each time) and has decreased from 3 to 1 coke (small can) daily. A1c 6/9 was 6. Denies polyuria/polydypsia, chest pain, dyspnea, or other concerns.   Follow up RLS Symptoms much improved on ropinerole, restarted 6/9. Denies any issues with medication and is pleased with effect.  Follow up dysphagia Reports 2 months of intermittent dysphagia, mostly with solids. She thinks she saw GI >1 year ago but cannot recall workup. Takes omeprazole 20mg  daily. No dyspnea or choking.    Review of Systems - Per HPI.  PMH: Cataract surgery (L) on 6/30. Will then start on eye drops (ilevro, besivance, and durezol) - added to med list. SH: H/o tobacco abuse  Objective:  Physical Exam BP 133/96 mmHg  Pulse 71  Temp(Src) 98.3 F (36.8 C) (Oral)  Ht 5\' 3"  (1.6 m)  Wt 187 lb (84.823 kg)  BMI 33.13 kg/m2 GEN: NAD, pleasant CV: RRR, no m/r/g PULM: CTAB, normal effort EXTR: No LE edema or calf tendernress HEENT: AT/[place], sclera clear, EOMI, o/p clear with MMM, no tonsils, neck supple, no LAD though mild submandiblar tenderness, no thyromegaly    Assessment:     Kristen Macias is a 81 y.o. female here for f/u RLS, prediabetes, and dysphagia.    Plan:     # See problem list and after visit summary for problem-specific plans.   Follow-up: Follow up in 1 mo to rediscuss workup for dysphagia; 3 months for repeat A1c.   Hilton Sinclair, MD [place]

## 2014-08-15 ENCOUNTER — Other Ambulatory Visit: Payer: Self-pay | Admitting: Family Medicine

## 2014-08-16 ENCOUNTER — Encounter: Payer: Self-pay | Admitting: Family Medicine

## 2014-08-16 ENCOUNTER — Telehealth: Payer: Self-pay | Admitting: Family Medicine

## 2014-08-16 NOTE — Assessment & Plan Note (Signed)
RLS symptoms have improved substantially with ropinerole; no SE. -Continue, and monitor symptoms. -Could add gabapentin if symptoms not fully controlled/recur but in 81 year old will hold off for now. -Continue staying active/walking daily and control HTN, pre-DM, and HLD.

## 2014-08-16 NOTE — Assessment & Plan Note (Addendum)
Declines workup of solids dysphagia for now, given she feels too busy with appts at this time. Will reconsider in 1 month. Concern for malignancy given h/o GERD. -Encouraged re-eval soon given risk for aspiration if any dysphagia. Softs for now -Re-evaluate in 1 month and refer for swallow study by SLP/ba swallow/GI workup at that time. -Seek immediate re-eval if worsening or any choking sensations.

## 2014-08-16 NOTE — Assessment & Plan Note (Signed)
Prediabetic A1c 6 on 6/9. No symptoms.  -Cotninue working on dietary changes (markedly decreased soda intake) and exercise (walks daily). -F/u in 3 months for repeat A1c. -No aspirin for now given h/o GERD on PPI and dysphagia -Recommended statin given 38yr ASCVD risk 22%. Sending in atorvastatin 40mg  daily and discussed potential SE. Repeat lipids in 6 mo.

## 2014-08-16 NOTE — Telephone Encounter (Signed)
Sent letter detailing the following lab results:  Microcytic anemia; at 05/2013 visit, patient was planning to discuss colonoscopy with GI. Will send letter with these results but also asking if she did this.  Could trial iron supplementation if patient would like, but just started statin and before that, ropinerole, so will leave thi sup to her. May help RLS symptoms.  TSH, CMET normal.   Lipid panel rquiring statin - discussed at most recent visit and started atorvastatin 40mg .  Hilton Sinclair, MD

## 2014-08-23 DIAGNOSIS — H25812 Combined forms of age-related cataract, left eye: Secondary | ICD-10-CM | POA: Diagnosis not present

## 2014-08-23 DIAGNOSIS — H2512 Age-related nuclear cataract, left eye: Secondary | ICD-10-CM | POA: Diagnosis not present

## 2014-08-24 DIAGNOSIS — H2511 Age-related nuclear cataract, right eye: Secondary | ICD-10-CM | POA: Diagnosis not present

## 2014-08-30 DIAGNOSIS — H2513 Age-related nuclear cataract, bilateral: Secondary | ICD-10-CM | POA: Diagnosis not present

## 2014-09-13 DIAGNOSIS — H2511 Age-related nuclear cataract, right eye: Secondary | ICD-10-CM | POA: Diagnosis not present

## 2014-09-20 DIAGNOSIS — H2513 Age-related nuclear cataract, bilateral: Secondary | ICD-10-CM | POA: Diagnosis not present

## 2014-10-04 DIAGNOSIS — Z0100 Encounter for examination of eyes and vision without abnormal findings: Secondary | ICD-10-CM | POA: Diagnosis not present

## 2014-10-08 ENCOUNTER — Ambulatory Visit (INDEPENDENT_AMBULATORY_CARE_PROVIDER_SITE_OTHER): Payer: Commercial Managed Care - HMO | Admitting: Family Medicine

## 2014-10-08 ENCOUNTER — Encounter: Payer: Self-pay | Admitting: Family Medicine

## 2014-10-08 VITALS — BP 138/70 | HR 80 | Temp 98.4°F | Wt 182.0 lb

## 2014-10-08 DIAGNOSIS — Z23 Encounter for immunization: Secondary | ICD-10-CM | POA: Diagnosis not present

## 2014-10-08 DIAGNOSIS — K2100 Gastro-esophageal reflux disease with esophagitis, without bleeding: Secondary | ICD-10-CM

## 2014-10-08 DIAGNOSIS — G2581 Restless legs syndrome: Secondary | ICD-10-CM | POA: Diagnosis not present

## 2014-10-08 MED ORDER — GABAPENTIN 100 MG PO CAPS: 100.0000 mg | ORAL_CAPSULE | Freq: Every day | ORAL | Status: DC

## 2014-10-08 MED ORDER — OMEPRAZOLE 20 MG PO CPDR: 20.0000 mg | DELAYED_RELEASE_CAPSULE | Freq: Two times a day (BID) | ORAL | Status: DC

## 2014-10-08 NOTE — Progress Notes (Signed)
   Subjective:   Kristen Macias is a 81 y.o. female with a history of HLD, DVT (on rivaroxaban), preDM, COPD  Here for  Chief Complaint  Patient presents with  . leg tingling   #Restless legs- Reports pins and needles at night. No aggravating or alleviating. Describes as stabbing pain. takes ropirinole but this causes severe nausea. Took last night and she vomited. Never take gabapentin. Reports medication also causes her to feel flushed.   #Dysphagia- progressive and reports when she eats solid foods she must wash down or she vomits. She reports PCP talked to her about endoscopy but at the time she was overwhelmed with other testing. She would like referral today. She also endorses worsening GERD and feels like "her medication has worn off" She is on omeprazole 20mg  daily.   #COPD: reports compliance with medication. Denies fevers, chills, sputum production.   Health Maintenance Due  Topic Date Due  . TETANUS/TDAP  10/13/1954  . ZOSTAVAX  10/13/1995  . INFLUENZA VACCINE  09/24/2014    Review of Systems:   Per HPI. All other systems reviewed and are negative expect as in HPI   PMH, PSH, Medications, Allergies, SocialHx and FHx reviewed and updated in EMR- marked as reviewed 10/08/2014   Objective:  BP 138/70 mmHg  Pulse 80  Temp(Src) 98.4 F (36.9 C) (Oral)  Wt 182 lb (82.555 kg)  Gen:  81 y.o. female in NAD. Speaking in full sentences. Good eye contact HEENT: , anicteric sclerae.  CV: RRR, no MRG, no JVD Resp: Normal WOB. Non-labored, CTAB, no wheezes noted GI: Soft, NTND, Ext: WWP, no edema. Bilateral shins are TTP MSK: Intact gait, though slow.  Neuro: Alert and oriented, speech normal Pysch: Normal mood and affect.       Chemistry      Component Value Date/Time   NA 136 08/02/2014 1148   K 4.2 08/02/2014 1148   CL 103 08/02/2014 1148   CO2 23 08/02/2014 1148   BUN 11 08/02/2014 1148   CREATININE 1.00 08/02/2014 1148   CREATININE 0.86 05/13/2010 0510        Component Value Date/Time   CALCIUM 9.9 08/02/2014 1148   ALKPHOS 100 08/02/2014 1148   AST 21 08/02/2014 1148   ALT 13 08/02/2014 1148   BILITOT 0.5 08/02/2014 1148      Lab Results  Component Value Date   HGBA1C 6.0 08/02/2014   Assessment:     [name] is a 81 y.o. female here for restless leg follow up, dysphagia    Plan:   #Restless leg: changed to gababpentin given SE on dopamine agonist. Follow up on sx in 4 weeks, can up titrate gabapentin with caution givne BEERS criteria medication.  #Dysphagia: referral to GI today for endoscopy in the setting of worsening and progressive intolerance of solids and worsening GERD. Will also increased PPI to BID  #COPD: well controlled, continued chronic medications.   #HCM:  Given prevnar today.   Return in about 4 weeks (around 11/05/2014), or with PCP- for chronic conditions (COPD, pre DM, HLD). up  Future Appointments Date Time Provider [place]  11/07/2014 2:00 PM Sela Hua, MD [facility]   Kristen Macadam, MD, ABFM [facility] Fellow, Faculty Practice 10/08/2014  4:44 PM

## 2014-10-08 NOTE — Patient Instructions (Signed)
Restless Legs Syndrome Restless legs syndrome is a movement disorder. It may also be called a sensorimotor disorder.  CAUSES  No one knows what specifically causes restless legs syndrome, but it tends to run in families. It is also more common in people with low iron, in pregnancy, in people who need dialysis, and those with nerve damage (neuropathy).Some medications may make restless legs syndrome worse.Those medications include drugs to treat high blood pressure, some heart conditions, nausea, colds, allergies, and depression. SYMPTOMS Symptoms include uncomfortable sensations in the legs. These leg sensations are worse during periods of inactivity or rest. They are also worse while sitting or lying down. Individuals that have the disorder describe sensations in the legs that feel like:  Pulling.  Drawing.  Crawling.  Worming.  Boring.  Tingling.  Pins and needles.  Prickling.  Pain. The sensations are usually accompanied by an overwhelming urge to move the legs. Sudden muscle jerks may also occur. Movement provides temporary relief from the discomfort. In rare cases, the arms may also be affected. Symptoms may interfere with going to sleep (sleep onset insomnia). Restless legs syndrome may also be related to periodic limb movement disorder (PLMD). PLMD is another more common motor disorder. It also causes interrupted sleep. The symptoms from PLMD usually occur most often when you are awake. TREATMENT  Treatment for restless legs syndrome is symptomatic. This means that the symptoms are treated.   Massage and cold compresses may provide temporary relief.  Walk, stretch, or take a cold or hot bath.  Get regular exercise and a good night's sleep.  Avoid caffeine, alcohol, nicotine, and medications that can make it worse.  Do activities that provide mental stimulation like discussions, needlework, and video games. These may be helpful if you are not able to walk or stretch. Some  medications are effective in relieving the symptoms. However, many of these medications have side effects. Ask your caregiver about medications that may help your symptoms. Correcting iron deficiency may improve symptoms for some patients. Document Released: 01/30/2002 Document Revised: 06/26/2013 Document Reviewed: 05/08/2010 ExitCare Patient Information 2015 [employer]. This information is not intended to replace advice given to you by your health care provider. Make sure you discuss any questions you have with your health care provider.  

## 2014-10-11 ENCOUNTER — Other Ambulatory Visit: Payer: Self-pay | Admitting: *Deleted

## 2014-10-11 DIAGNOSIS — E785 Hyperlipidemia, unspecified: Secondary | ICD-10-CM

## 2014-10-11 DIAGNOSIS — K2100 Gastro-esophageal reflux disease with esophagitis, without bleeding: Secondary | ICD-10-CM

## 2014-10-11 DIAGNOSIS — G2581 Restless legs syndrome: Secondary | ICD-10-CM

## 2014-10-11 MED ORDER — ATORVASTATIN CALCIUM 40 MG PO TABS: 40.0000 mg | ORAL_TABLET | Freq: Every day | ORAL | Status: DC

## 2014-10-11 MED ORDER — OMEPRAZOLE 20 MG PO CPDR: 20.0000 mg | DELAYED_RELEASE_CAPSULE | Freq: Two times a day (BID) | ORAL | Status: DC

## 2014-10-11 MED ORDER — GABAPENTIN 100 MG PO CAPS: 100.0000 mg | ORAL_CAPSULE | Freq: Every day | ORAL | Status: DC

## 2014-10-11 NOTE — Telephone Encounter (Signed)
Please let Ms. Opfer know that I have sent in her prescriptions to the pharmacy. Thank you!

## 2014-11-07 ENCOUNTER — Encounter: Payer: Self-pay | Admitting: Internal Medicine

## 2014-11-07 ENCOUNTER — Ambulatory Visit (INDEPENDENT_AMBULATORY_CARE_PROVIDER_SITE_OTHER): Payer: Commercial Managed Care - HMO | Admitting: Internal Medicine

## 2014-11-07 VITALS — BP 119/58 | HR 73 | Temp 97.9°F | Wt 183.8 lb

## 2014-11-07 DIAGNOSIS — R1314 Dysphagia, pharyngoesophageal phase: Secondary | ICD-10-CM | POA: Diagnosis not present

## 2014-11-07 DIAGNOSIS — R7309 Other abnormal glucose: Secondary | ICD-10-CM

## 2014-11-07 DIAGNOSIS — J449 Chronic obstructive pulmonary disease, unspecified: Secondary | ICD-10-CM

## 2014-11-07 DIAGNOSIS — R7303 Prediabetes: Secondary | ICD-10-CM

## 2014-11-07 DIAGNOSIS — R1310 Dysphagia, unspecified: Secondary | ICD-10-CM | POA: Diagnosis not present

## 2014-11-07 LAB — POCT GLYCOSYLATED HEMOGLOBIN (HGB A1C): HEMOGLOBIN A1C: 6.2

## 2014-11-07 NOTE — Assessment & Plan Note (Signed)
Last A1c was 6.0 in 07/2014.  - Will check HgbA1c today

## 2014-11-07 NOTE — Patient Instructions (Addendum)
It was nice to meet you! Thank you for coming into clinic today.  I have sent in another referral for you to see GI (stomach doctor) about your difficulty swallowing. If you do not hear from our office in 3 weeks, please contact us.  Please go to the lab to get your Hemoglobin A1c measured. This checks for diabetes.  I will talk with our financial counselor to see if we can help you pay for an inhaler and stool softener.   -Dr. Brett Albino

## 2014-11-07 NOTE — Progress Notes (Signed)
[facility] Phone: [phone]  Subjective:  Dysphagia: Has been going on for a couple of months. Worse with solid foods, such as grapes and bread. Has to drink a lot of water to help the food move through her esophagus while eating. She feels like the food continues to get stuck and this causes her to vomit. Has been vomiting at least once a day. Was seen about 1 month ago and was given a referral to GI for endoscopy or barium swallow. She was never called to schedule this appointment. Her Prilosec was increased from 20mg  qd to 20mg  bid last month, which has helped her heartburn. She has not had any fevers or unintentional weight loss, but has had decreased appetite.  COPD: Has been coughing up "green stuff" for the last month. Sputum production is worse in the morning. Has never really had sputum production in the past. Is now getting short of breath walking to her mailbox. This is not normal for her. She was short of breath with walking when she was first diagnosed with COPD, but her breathing has been well-controlled since then. She used to use Albuterol and Salmeterol inhalers, but she has run out of these and cannot afford new ones. She is formerly smoked for 50 years, but quit 4-5 years ago when she was diagnosed with COPD. No chest pain, no shortness of breath at rest.  Prediabetes: Last HgbA1c was 6.0 in 07/2014. Has checked her blood sugar once because she was provided test strips by [facility]. The blood sugar was 165, but she tested her sugar right after eating. She has been trying to watch what she eats. She denies any polyuria, polydipsia, shaking, or sweating.  All other ROS were reviewed and are negative unless otherwise noted in the HPI. Past Medical History- significant for DVT, COPD, GERD, prediabetes, restless leg syndrome, HLD, dysphagia Reviewed problem list.  Medications- reviewed and updated Current Outpatient Prescriptions  Medication Sig Dispense  Refill  . atorvastatin (LIPITOR) 40 MG tablet Take 1 tablet (40 mg total) by mouth daily. 90 tablet 3  . Besifloxacin HCl (BESIVANCE OP) Apply to eye.    . Difluprednate (DUREZOL OP) Apply to eye.    Regino Schultze Bandages & Supports (MEDICAL COMPRESSION STOCKINGS) MISC 1 Units by Does not apply route daily. 1 each 3  . gabapentin (NEURONTIN) 100 MG capsule Take 1 capsule (100 mg total) by mouth at bedtime. 30 capsule 2  . Nepafenac (ILEVRO OP) Apply to eye.    Marland Kitchen omeprazole (PRILOSEC) 20 MG capsule Take 1 capsule (20 mg total) by mouth 2 (two) times daily before a meal. 30 capsule 2  . polyethylene glycol powder (GLYCOLAX/MIRALAX) powder Take 17 g by mouth daily. 3350 g 1  . PROVENTIL HFA 108 (90 BASE) MCG/ACT inhaler INHALE 2 PUFFS INTO THE LUNGS EVERY 6 (SIX) HOURS AS NEEDED FOR WHEEZING. 6.7 each 0  . Rivaroxaban (XARELTO) 20 MG TABS tablet Take 1 tablet (20 mg total) by mouth daily with supper. Starting in 3 weeks 06/08/13 30 tablet 4  . rOPINIRole (REQUIP) 0.5 MG tablet Take 1/2 tablet before bedtime for 2 days. Then take 1 tablet before bedtime for 7 days. Then take 2 tablets before bedtime. 60 tablet 1  . salmeterol (SEREVENT) 50 MCG/DOSE diskus inhaler Inhale 1 puff into the lungs 2 (two) times daily. 1 Inhaler 6   No current facility-administered medications for this visit.   Chief complaint-noted Family history reviewed for today's visit. No changes. Social  history- patient is a former smoker. Quit 4-5 years ago.  Objective: BP 119/58 mmHg  Pulse 73  Temp(Src) 97.9 F (36.6 C)  Wt 183 lb 12.8 oz (83.371 kg) Gen: NAD, alert, cooperative with exam HEENT: NCAT, EOMI, MMM, oropharynx clear without erythema or lesions Neck: FROM, supple CV: RRR, good S1/S2, no murmur Resp: Normal work of breathing, coarse breath sounds heard throughout all lung fields, mild expiratory wheezing in L lower lobe. GI: SNTND, BS present, no guarding or organomegaly Msk: No edema, warm, normal tone, moves  UE/LE spontaneously Neuro: Alert and oriented, no gross deficits Skin: no rashes, no lesions Psych: Appropriate behavior  Assessment/Plan: See problem based a/p   Hyman Bible, MD PGY-1

## 2014-11-07 NOTE — Assessment & Plan Note (Addendum)
Having increased cough with green sputum production and increased shortness of breath with walking. Has been on Albuterol and Salmeterol inhalers in the past, but stopped using them because she could no longer afford them. I believe that she needs to be on a daily medication such as Spiriva to prevent COPD exacerbations and future hospitalizations. However, she cannot afford these inhalers.  - Will talk with CSW to figure out if there is any way we can get her an inhaler

## 2014-11-07 NOTE — Assessment & Plan Note (Signed)
Dysphagia with solids. Has been vomiting at least once per day. Was referred to GI one month ago, but never got a call for an appointment. - Will refer to GI again for endoscopy vs barium swallow. Will talk with referral coordinator to make sure she gets an appointment. - Continue Prilosec 20mg  bid

## 2014-11-09 DIAGNOSIS — H2513 Age-related nuclear cataract, bilateral: Secondary | ICD-10-CM | POA: Diagnosis not present

## 2014-11-15 ENCOUNTER — Telehealth: Payer: Self-pay | Admitting: Clinical

## 2014-11-15 NOTE — Telephone Encounter (Signed)
CSW contacted pt regarding needing an inhaler. CSW informed pt that CSW could place a referral to [facility] to receive assistance in navigating medication assistance. Pt stated she would be agreeable to a referral. CSW will notify [facility].  Hunt Oris, MSW, [place]

## 2014-11-15 NOTE — Telephone Encounter (Signed)
-----   Message from Arne Cleveland, [facility] sent at 11/13/2014  4:18 PM EDT ----- Regarding: RE: Pt can't afford meds Patient was a [facility] referral but refused services - I would see if she would be interested now and tell her that we can help with her medications and financial stuff.   We will do the paperwork for Medicare Extra Help and assistance with inhalers. This is all based on her finances but our care management assistance can assess that.   If she does not, let me know and I can assist you guys in doing the paperwork.    ----- Message -----    From: Hunt Oris, LCSW    Sent: 11/13/2014   3:28 PM      To: Arne Cleveland, RPH, Sela Hua, MD Subject: RE: Pt can't afford meds                       Do you have any suggestions? Do you know which med is cheaper?  ----- Message -----    From: Sela Hua, MD    Sent: 11/13/2014   3:11 PM      To: Hunt Oris, LCSW Subject: RE: Pt can't afford meds                       She is not on any inhalers right now. I think she has just had Albuterol in the past. I would like to start her on Spiriva, but I think any COPD controller medication would be better than nothing. I talked with Dr. Valentina Lucks and he did not have any samples.   Thanks so much, Valetta Fuller   ----- Message -----    From: Hunt Oris, LCSW    Sent: 11/13/2014   2:32 PM      To: Sela Hua, MD Subject: RE: Pt can't afford meds                       Which inhaler is she on? Did you speak with Dr. Valentina Lucks about any options or whether we have samples?   I will call her after I confirm which inhaler she is on so I can have her tell me what the copay is. If its a lot unfortunately we won't be able to cover it especially since it appears to be a regular financial problem. ----- Message -----    From: Sela Hua, MD    Sent: 11/07/2014   5:46 PM      To: Hunt Oris, LCSW Subject: Pt can't afford meds                           [name]! Hope you're doing well. My patient,  Kristen Macias, has COPD that is worsening and she needs a daily inhaler to prevent future COPD exacerbations and hospitalizations. She says she absolutely cannot afford an inhaler right now. She has [employer]. Do we have any options? She won't qualify for our orange card program because she has insurance. Thank you so much for your help!  Valetta Fuller

## 2014-11-16 ENCOUNTER — Encounter: Payer: Self-pay | Admitting: Internal Medicine

## 2014-11-16 NOTE — Patient Outreach (Signed)
[place] [facility]) Care Management  11/16/2014  JOZETTE CASTRELLON 11/05/1933 [id number]   Referral from MD's office via EPIC, assigned Quinn Plowman, RN to outreach.  Thanks, Ronnell Freshwater. [place], [place] Assistant Phone: [phone] Fax: [phone]

## 2014-11-26 ENCOUNTER — Other Ambulatory Visit: Payer: Self-pay

## 2014-11-26 NOTE — Patient Outreach (Signed)
[place] [facility]) Care Management  11/26/2014  Kristen Macias August 10, 1933 [id number]   Telephone call to patient regarding primary MD referral. Unable to reach patient or leave voice message.  Message states patient is not available.   PLAN: RNCM will attempt 2nd outreach call within 3 business days.   Quinn Plowman RN,BSN,CCM [place] Coordinator [phone]

## 2014-11-28 ENCOUNTER — Other Ambulatory Visit: Payer: Self-pay

## 2014-11-28 NOTE — Patient Outreach (Signed)
[place] [facility]) Care Management  11/28/2014  SAMELLA LUCCHETTI [birth year] [id number]   SUBJECTIVE: Telephone call to patient regarding primary MD referral.  HIPAA verified with patient.  Discussed and offered [facility] care management services to patient. Patient refused services at this time.  Patient states she feels like she is managing her health ok.  Patient verbally agreed to receive [place] Management outreach letter and pamphlet for future reference.   PLAN: RNCM will forward to [place] to close due to refusal of services. RNCM will notify patients primary MD of refusal of services.   Quinn Plowman RN,BSN,CCM [place] Coordinator [phone]

## 2014-11-30 ENCOUNTER — Ambulatory Visit: Payer: Commercial Managed Care - HMO | Admitting: Internal Medicine

## 2014-12-03 NOTE — Patient Outreach (Signed)
[place] [facility]) Care Management  12/03/2014  JOSHLYN BEADLE [birth year] [id number]   Notification from Quinn Plowman, RN to close case due to patient refused [place] Management services.  Thanks, Ronnell Freshwater. [place], [place] Assistant Phone: [phone] Fax: [phone]

## 2014-12-19 ENCOUNTER — Other Ambulatory Visit: Payer: Self-pay | Admitting: Internal Medicine

## 2014-12-19 NOTE — Telephone Encounter (Signed)
Refill request from pharmacy. Will forward to PCP for review. Darrly Loberg, CMA. 

## 2015-01-09 ENCOUNTER — Other Ambulatory Visit: Payer: Self-pay | Admitting: Internal Medicine

## 2015-01-09 NOTE — Telephone Encounter (Signed)
I have refilled Ms. Vensel's test strips. Thank you.

## 2015-01-09 NOTE — Telephone Encounter (Signed)
Refill request from pharmacy. Will forward to PCP for review. Zophia Marrone, CMA. 

## 2015-02-22 ENCOUNTER — Other Ambulatory Visit: Payer: Self-pay | Admitting: Family Medicine

## 2015-03-07 ENCOUNTER — Encounter: Payer: Self-pay | Admitting: Family Medicine

## 2015-03-07 ENCOUNTER — Ambulatory Visit (INDEPENDENT_AMBULATORY_CARE_PROVIDER_SITE_OTHER): Payer: Commercial Managed Care - HMO | Admitting: Family Medicine

## 2015-03-07 VITALS — BP 149/63 | HR 63 | Temp 97.9°F | Ht 63.0 in | Wt 188.0 lb

## 2015-03-07 DIAGNOSIS — M4854XA Collapsed vertebra, not elsewhere classified, thoracic region, initial encounter for fracture: Secondary | ICD-10-CM

## 2015-03-07 DIAGNOSIS — M8448XA Pathological fracture, other site, initial encounter for fracture: Secondary | ICD-10-CM

## 2015-03-07 DIAGNOSIS — M546 Pain in thoracic spine: Secondary | ICD-10-CM | POA: Diagnosis not present

## 2015-03-07 DIAGNOSIS — M810 Age-related osteoporosis without current pathological fracture: Secondary | ICD-10-CM | POA: Diagnosis not present

## 2015-03-07 LAB — POCT URINALYSIS DIPSTICK
BILIRUBIN UA: NEGATIVE
Glucose, UA: NEGATIVE
KETONES UA: NEGATIVE
Nitrite, UA: NEGATIVE
PH UA: 6.5
Protein, UA: NEGATIVE
RBC UA: NEGATIVE
Spec Grav, UA: 1.015
Urobilinogen, UA: 0.2

## 2015-03-07 LAB — POCT UA - MICROSCOPIC ONLY

## 2015-03-07 MED ORDER — TRAMADOL HCL 50 MG PO TABS: 50.0000 mg | ORAL_TABLET | Freq: Three times a day (TID) | ORAL | Status: DC | PRN

## 2015-03-07 NOTE — Patient Instructions (Signed)
I will call you when I get the results of your xray to discuss next steps.

## 2015-03-08 ENCOUNTER — Other Ambulatory Visit: Payer: Self-pay | Admitting: Family Medicine

## 2015-03-08 ENCOUNTER — Ambulatory Visit
Admission: RE | Admit: 2015-03-08 | Discharge: 2015-03-08 | Disposition: A | Discharge status: Home / Self Care | Payer: Commercial Managed Care - HMO | Source: Ambulatory Visit | Attending: Family Medicine | Admitting: Family Medicine

## 2015-03-08 DIAGNOSIS — S22000A Wedge compression fracture of unspecified thoracic vertebra, initial encounter for closed fracture: Secondary | ICD-10-CM

## 2015-03-08 DIAGNOSIS — M546 Pain in thoracic spine: Secondary | ICD-10-CM

## 2015-03-08 DIAGNOSIS — M810 Age-related osteoporosis without current pathological fracture: Secondary | ICD-10-CM | POA: Insufficient documentation

## 2015-03-08 DIAGNOSIS — S22060A Wedge compression fracture of T7-T8 vertebra, initial encounter for closed fracture: Secondary | ICD-10-CM | POA: Diagnosis not present

## 2015-03-08 HISTORY — DX: Wedge compression fracture of unspecified thoracic vertebra, initial encounter for closed fracture: S22.000A

## 2015-03-08 NOTE — Progress Notes (Signed)
   Subjective:   TEYHA WALLAR is a 82 y.o. female with a history of COPD, osteopenia, GERD here for back pain  BACK PAIN  Back pain began 4 days ago. She woke up with it Monday morning Pain is described as sharp Patient has tried aleve without relief Pain radiates  from left to right ribcage.Marland Kitchen History of trauma or injury: no Prior history of similar pain: no History of cancer: no Weak immune system:  no History of IV drug use: no History of steroid use: intermittent for copd exacerbations  Symptoms Incontinence of bowel or bladder:  no Numbness of leg: no Fever: no Rest or Night pain: yes, pain worse when supine Weight Loss:  no Rash: no  Review of Systems:  Per HPI. All other systems reviewed and are negative.   PMH, PSH, Medications, Allergies, and FmHx reviewed and updated in EMR.  Social History: former smoker  Objective:  BP 149/63 mmHg  Pulse 63  Temp(Src) 97.9 F (36.6 C) (Oral)  Ht 5\' 3"  (1.6 m)  Wt 188 lb (85.276 kg)  BMI 33.31 kg/m2  Gen:  82 y.o. female in NAD HEENT: NCAT, MMM, EOMI, PERRL, anicteric sclerae CV: RRR, no MRG, no JVD Resp: Non-labored, CTAB, no wheezes noted Abd: Soft, NTND, BS present, no guarding or organomegaly Ext: WWP, no edema MSK: normal strength and sensation BLE, no midline or paraspinus tenderness, tender over lateral ribcage at level of midthoracic spine Neuro: Alert and oriented, speech normal      Chemistry      Component Value Date/Time   NA 136 08/02/2014 1148   K 4.2 08/02/2014 1148   CL 103 08/02/2014 1148   CO2 23 08/02/2014 1148   BUN 11 08/02/2014 1148   CREATININE 1.00 08/02/2014 1148   CREATININE 0.86 05/13/2010 0510      Component Value Date/Time   CALCIUM 9.9 08/02/2014 1148   ALKPHOS 100 08/02/2014 1148   AST 21 08/02/2014 1148   ALT 13 08/02/2014 1148   BILITOT 0.5 08/02/2014 1148      Lab Results  Component Value Date   WBC 6.8 08/02/2014   HGB 10.3* 08/02/2014   HCT 32.7* 08/02/2014   MCV 68.8* 08/02/2014   PLT 330 08/02/2014   Lab Results  Component Value Date   TSH 3.611 08/02/2014   Lab Results  Component Value Date   HGBA1C 6.2 11/07/2014   Assessment & Plan:     TIMEKA ROSLER is a 82 y.o. female here for back pain  Compression fracture of T7 thoracic vertebra Mid thoracic back pain radiates left to right, no midline tenderness, no trauma, no fevers or weakness, numbness - xray shows T7 fx, >50% height loss - tramadol plus aleve - rtc if not improving, could consider adding calcitonin - f/u in 2 weeks with PCP to discuss starting osteoporosis treatment to prevent more fractures - likely could benefit from PT once pain improved      Beverlyn Roux, MD, MPH [facility] PGY-3 03/08/2015 9:02 PM

## 2015-03-08 NOTE — Assessment & Plan Note (Addendum)
Mid thoracic back pain radiates left to right, no midline tenderness, no trauma, no fevers or weakness, numbness - xray shows T7 fx, >50% height loss - tramadol plus aleve - rtc if not improving, could consider adding calcitonin - f/u in 2 weeks with PCP to discuss starting osteoporosis treatment to prevent more fractures - likely could benefit from PT once pain improved

## 2015-03-12 ENCOUNTER — Telehealth: Payer: Self-pay | Admitting: *Deleted

## 2015-03-12 NOTE — Telephone Encounter (Signed)
Called patient to offer flu vaccine. Scheduled patient to receive flu vaccine on 03/21/2015 at 2 PM. Patient also requested spinal x-ray results taken last week.  Notes Recorded by Frazier Richards, MD on 03/08/2015 at 7:02 PM Advised patient of xray result. T7 compression fracture. Acute management with aleve and tramadol as already discussed. Advised patient to return if pain not improving and could consider adding calcitonin. Will need long term treatment for osteoporosis to prevent recurrence.     Patient given above information. Patient states that pain has lessened and she does have "pain pills" left. States she will call back to make appt with PCP if pain worsens.  Velora Heckler, RN

## 2015-03-21 ENCOUNTER — Ambulatory Visit: Payer: Commercial Managed Care - HMO

## 2015-04-04 ENCOUNTER — Ambulatory Visit (INDEPENDENT_AMBULATORY_CARE_PROVIDER_SITE_OTHER): Payer: Commercial Managed Care - HMO | Admitting: Family Medicine

## 2015-04-04 ENCOUNTER — Encounter: Payer: Self-pay | Admitting: Family Medicine

## 2015-04-04 VITALS — BP 123/64 | HR 107 | Temp 98.5°F | Wt 187.0 lb

## 2015-04-04 DIAGNOSIS — R05 Cough: Secondary | ICD-10-CM

## 2015-04-04 DIAGNOSIS — R059 Cough, unspecified: Secondary | ICD-10-CM

## 2015-04-04 DIAGNOSIS — Z23 Encounter for immunization: Secondary | ICD-10-CM

## 2015-04-04 MED ORDER — OMEPRAZOLE 20 MG PO CPDR: DELAYED_RELEASE_CAPSULE | ORAL | Status: DC

## 2015-04-04 NOTE — Patient Instructions (Signed)
Dextromethorphan 30mg  - Guafenesin 600mg   Generally called "Mucinex DM" or the store brand, as your pharmacist

## 2015-04-04 NOTE — Progress Notes (Signed)
   Subjective:    Patient ID: Kristen Macias, female    DOB: [birth year], 82 y.o.   MRN: [id number]  HPI  Patient presents for Same Day Appointment  CC: cough  # Cough:  Last Monday, about 10 days ago  Productive since the beginning of green sputum/mucous  Only short of breath when coughing  No runny nose, no sore throat, no ear pain  Appetite is good still  No diarrhea, no fevers  Hasn't been around any sick contacts  Staying the same. Cough is worse at night  Tried some dayquil/nyquil with only minimal relief  Social Hx: former smoker, quit 2012  Review of Systems   See HPI for ROS.   Past medical history, surgical, family, and social history reviewed and updated in the EMR as appropriate.  Objective:  BP 123/64 mmHg  Pulse 107  Temp(Src) 98.5 F (36.9 C) (Oral)  Wt 187 lb (84.823 kg) Vitals and nursing note reviewed  General: no apparent distress  HEENT: moist mucous membranes. No posterior pharyngeal erythema. CV: tachycardic, regular rhythm. No murmur appreciated. Resp: clear bilaterally, no wheezes, rhonchi or crackles appreciated, normal effort   Assessment & Plan:  1. Cough Well appearing on clinical exam, no concerning signs or symptoms for pneumonia. Likely viral bronchitis. Recommended stronger dose mucinex DM, hydration, hand hygiene. If not improving, worsens with shortness of breath, return to clinic. Discussed cough can possibly last multiple weeks, but if not getting better over next 2-3 to come back to get looked at again.  2. Encounter for immunization - Flu Vaccine QUAD 36+ mos IM

## 2015-05-07 ENCOUNTER — Other Ambulatory Visit: Payer: Self-pay | Admitting: Internal Medicine

## 2015-11-28 ENCOUNTER — Other Ambulatory Visit: Payer: Self-pay | Admitting: Internal Medicine

## 2015-11-28 NOTE — Telephone Encounter (Signed)
Needs refill on acid reflux medicine.  Kristen Macias

## 2015-11-29 MED ORDER — OMEPRAZOLE 20 MG PO CPDR: DELAYED_RELEASE_CAPSULE | ORAL | 2 refills | Status: DC

## 2016-01-30 ENCOUNTER — Ambulatory Visit: Payer: Commercial Managed Care - HMO | Admitting: *Deleted

## 2016-04-28 ENCOUNTER — Ambulatory Visit (HOSPITAL_COMMUNITY)
Admission: RE | Admit: 2016-04-28 | Discharge: 2016-04-28 | Disposition: A | Discharge status: Home / Self Care | Payer: Medicare HMO | Source: Ambulatory Visit | Attending: Family Medicine | Admitting: Family Medicine

## 2016-04-28 ENCOUNTER — Other Ambulatory Visit: Payer: Self-pay | Admitting: Internal Medicine

## 2016-04-28 ENCOUNTER — Encounter: Payer: Self-pay | Admitting: Family Medicine

## 2016-04-28 ENCOUNTER — Ambulatory Visit (INDEPENDENT_AMBULATORY_CARE_PROVIDER_SITE_OTHER): Payer: Medicare HMO | Admitting: Family Medicine

## 2016-04-28 VITALS — BP 120/70 | HR 68 | Temp 98.3°F | Ht 63.0 in | Wt 192.0 lb

## 2016-04-28 DIAGNOSIS — I499 Cardiac arrhythmia, unspecified: Secondary | ICD-10-CM | POA: Diagnosis not present

## 2016-04-28 DIAGNOSIS — R7303 Prediabetes: Secondary | ICD-10-CM | POA: Diagnosis not present

## 2016-04-28 DIAGNOSIS — J449 Chronic obstructive pulmonary disease, unspecified: Secondary | ICD-10-CM

## 2016-04-28 DIAGNOSIS — G2581 Restless legs syndrome: Secondary | ICD-10-CM

## 2016-04-28 DIAGNOSIS — I482 Chronic atrial fibrillation, unspecified: Secondary | ICD-10-CM

## 2016-04-28 DIAGNOSIS — Z029 Encounter for administrative examinations, unspecified: Secondary | ICD-10-CM | POA: Diagnosis not present

## 2016-04-28 LAB — POCT GLYCOSYLATED HEMOGLOBIN (HGB A1C): Hemoglobin A1C: 6.1

## 2016-04-28 MED ORDER — TIOTROPIUM BROMIDE MONOHYDRATE 18 MCG IN CAPS: 18.0000 ug | ORAL_CAPSULE | Freq: Every day | RESPIRATORY_TRACT | 1 refills | Status: DC

## 2016-04-28 MED ORDER — APIXABAN 5 MG PO TABS: 5.0000 mg | ORAL_TABLET | Freq: Two times a day (BID) | ORAL | 1 refills | Status: DC

## 2016-04-28 MED ORDER — METOPROLOL TARTRATE 25 MG PO TABS: 50.0000 mg | ORAL_TABLET | Freq: Every day | ORAL | 1 refills | Status: DC

## 2016-04-28 NOTE — Telephone Encounter (Signed)
Pt needs her spriva now.  It was called into [facility] this morning. Please call it in to [place]

## 2016-04-28 NOTE — Patient Instructions (Addendum)
It was great seeing you today! We have addressed the following issues today  1. I started you on Spiriva for your shortness of breath. Use it everyday as prescribed. 2. You were found to have atrial fibrillation ( irregular heart beat) I will start you on two medications to decrease your risk of stroke and improve cardiac function 3. For your restless leg syndrome continue taking aleve pm 4. Your A1c for your diabetes was on target. 5. Please come back for a lab visit I will see you in two weeks.  If we did any lab work today, and the results require attention, either me or my nurse will get in touch with you. If everything is normal, you will get a letter in mail and a message via . If you don't hear from [place] in two weeks, please give [place] a call. Otherwise, we look forward to seeing you again at your next visit. If you have any questions or concerns before then, please call the clinic at [phone].  Please bring all your medications to every doctors visit  Sign up for My Chart to have easy access to your labs results, and communication with your Primary care physician.    Please check-out at the front desk before leaving the clinic.    Take Care,   Dr. Andy Gauss   Atrial Fibrillation Atrial fibrillation is a type of heartbeat that is irregular or fast (rapid). If you have this condition, your heart keeps quivering in a weird (chaotic) way. This condition can make it so your heart cannot pump blood normally. Having this condition gives a person more risk for stroke, heart failure, and other heart problems. There are different types of atrial fibrillation. Talk with your doctor to learn about the type that you have. Follow these instructions at home: Take over-the-counter and prescription medicines only as told by your doctor. If your doctor prescribed a blood-thinning medicine, take it exactly as told. Taking too much of it can cause bleeding. If you do not take enough of it, you will not  have the protection that you need against stroke and other problems. Do not use any tobacco products. These include cigarettes, chewing tobacco, and e-cigarettes. If you need help quitting, ask your doctor. If you have apnea (obstructive sleep apnea), manage it as told by your doctor. Do not drink alcohol. Do not drink beverages that have caffeine. These include coffee, soda, and tea. Maintain a healthy weight. Do not use diet pills unless your doctor says they are safe for you. Diet pills may make heart problems worse. Follow diet instructions as told by your doctor. Exercise regularly as told by your doctor. Keep all follow-up visits as told by your doctor. This is important. Contact a doctor if: You notice a change in the speed, rhythm, or strength of your heartbeat. You are taking a blood-thinning medicine and you notice more bruising. You get tired more easily when you move or exercise. Get help right away if: You have pain in your chest or your belly (abdomen). You have sweating or weakness. You feel sick to your stomach (nauseous). You notice blood in your throw up (vomit), poop (stool), or pee (urine). You are short of breath. You suddenly have swollen feet and ankles. You feel dizzy. Your suddenly get weak or numb in your face, arms, or legs, especially if it happens on one side of your body. You have trouble talking, trouble understanding, or both. Your face or your eyelid droops on one  side. These symptoms may be an emergency. Do not wait to see if the symptoms will go away. Get medical help right away. Call your local emergency services (911 in the [place] Do not drive yourself to the hospital. This information is not intended to replace advice given to you by your health care provider. Make sure you discuss any questions you have with your health care provider. Document Released: 11/19/2007 Document Revised: 07/18/2015 Document Reviewed: 06/06/2014 Elsevier Interactive Patient  Education  2017 [employer].

## 2016-04-28 NOTE — Telephone Encounter (Signed)
Medication sent to local pharmacy for patient. Clayton Bosserman,CMA

## 2016-04-28 NOTE — Progress Notes (Signed)
Subjective:    Patient ID: Kristen Macias, female    DOB: 1933/03/28, 83 y.o.   MRN: [id number]   CC: Shortness of breath  HPI: Kristen Macias is 83 y.o. Female with past medical history significant for COPD, GERD, atrial fibrillation, DM2, HLD, osteoporosis and restless leg syndrome who presented today complaining of shortness of breath. Patient reports symptoms started three weeks ago when she started feeling short of breath while walking to her mailbox. Patient was using her Ventolin inhaler in the past about once a week for SOB but ran out about a month ago. Patient also reports some palpitations for the past three weeks that she attributed to taking aleve pm for her restless leg syndrome. Patient denies any chest pain, nausea, vomiting and abdominal pain.  Smoking status reviewed   ROS: all other systems were reviewed and are negative other than in the HPI   Past Medical History:  Diagnosis Date  . GERD (gastroesophageal reflux disease)   . Pneumonia April 2012   Past Surgical History:  Procedure Laterality Date  . APPENDECTOMY    . Brother cut off fingers with axe as child     Accidental  . Rotator cuff  2001  . TONSILLECTOMY    . TOTAL ABDOMINAL HYSTERECTOMY W/ BILATERAL SALPINGOOPHORECTOMY  Long time ago     Objective:  BP 120/70   Pulse 68   Temp 98.3 F (36.8 C) (Oral)   Ht 5\' 3"  (1.6 m)   Wt 192 lb (87.1 kg)   SpO2 98%   BMI 34.01 kg/m   Vitals and nursing note reviewed  General: NAD, pleasant, able to participate in exam Cardiac: irregular heart rhythm, normal heart sounds, no murmurs. 2+ radial and PT pulses bilaterally Respiratory: CTAB, normal effort, No wheezes, rales or rhonchi Abdomen: soft, nontender, nondistended, no hepatic or splenomegaly, +BS Extremities: no edema or cyanosis. WWP. Skin: warm and dry, no rashes noted Neuro: alert and oriented x4, no focal deficits Psych: Normal affect and mood   Assessment & Plan:   #Dyspnea, chronic    Patient has a history of COPD, former long time smoker was previously on Serevent diskus but appear to not have taken in a while. Last seen in clinic about a year ago. Patient has been using her ventolin intermittently. On exam patient is clear bilaterally upper and lower lung field no wheezes or crackles appreciated. Dyspnea most likely secondary to inadequate treatment in the setting of mild COPD exacerbation. O2 sat in clinic today was 98%. Patient will need daily controller and some education. --Will start patient on Spiriva Handihaler   #Atrial Fibrillation, chronic  Patient reported palpitations, on exam patient had irregular heart rhythm with normal rate. Given age and symptoms of SOB and prior DVT treated with xarelto in the past, felt that EKG was warranted to rule out afib. !2 lead EKG showed atrial fibrillation with normal rate. --Patient started on metoprolol 50 mg daily --Patient started on Eliquis 5 mg bid --Order CBC  #DM2, chronic  A1c today was 6.1 stable from 6.2 on 10/2014. Patient currently on no medications for her prediabetes/diabetes diagnosis. Will continue to follow. --Order CMP  #Restless leg syndrome, chronic  Patient with long history of restless leg syndrome, started on requip and gabapentin but did not tolerate and reported nausea and syncopal episode upon started medications. Currently taking aleve pm with some relief. Given patient age will not approach at the moment. Patient has been on omeprazole chronically possible B12 deficiency and  Iron deficiency cpould be a contributing factor in symptoms. --Order Iron, ferritin, B12 level  #Hyperlipidemia, chronic Patient was previously on atorvastatin but stopped at her own wish. Most likely will need to be restarted, will reorder lipid panel to assess current situation. --Order Lipid panel  #Obesity, chronic  A1c was 6.1 down from 6.2. Patient is overweight but has been unable to lose weight despite effort to  walk. --Will check TSH --Patient will see me in clinic in two weeks to review results and adjust treatment plan accordingly.   Marjie Skiff, MD Family Medicine Resident PGY-1

## 2016-04-30 ENCOUNTER — Other Ambulatory Visit: Payer: Medicare HMO

## 2016-04-30 DIAGNOSIS — G2581 Restless legs syndrome: Secondary | ICD-10-CM | POA: Diagnosis not present

## 2016-04-30 DIAGNOSIS — R7303 Prediabetes: Secondary | ICD-10-CM | POA: Diagnosis not present

## 2016-04-30 LAB — COMPLETE METABOLIC PANEL WITH GFR
ALT: 11 U/L (ref 6–29)
AST: 17 U/L (ref 10–35)
Albumin: 4.1 g/dL (ref 3.6–5.1)
Alkaline Phosphatase: 131 U/L — ABNORMAL HIGH (ref 33–130)
BUN: 18 mg/dL (ref 7–25)
CALCIUM: 9.2 mg/dL (ref 8.6–10.4)
CHLORIDE: 102 mmol/L (ref 98–110)
CO2: 21 mmol/L (ref 20–31)
CREATININE: 1.15 mg/dL — AB (ref 0.60–0.88)
GFR, Est African American: 52 mL/min — ABNORMAL LOW (ref >=60)
GFR, Est Non African American: 45 mL/min — ABNORMAL LOW (ref >=60)
Glucose, Bld: 97 mg/dL (ref 65–99)
Potassium: 4.7 mmol/L (ref 3.5–5.3)
Sodium: 138 mmol/L (ref 135–146)
Total Bilirubin: 0.3 mg/dL (ref 0.2–1.2)
Total Protein: 7.5 g/dL (ref 6.1–8.1)

## 2016-04-30 LAB — CBC
HCT: 31.3 % — ABNORMAL LOW (ref 35.0–45.0)
Hemoglobin: 9.5 g/dL — ABNORMAL LOW (ref 11.7–15.5)
MCH: 20.6 pg — ABNORMAL LOW (ref 27.0–33.0)
MCHC: 30.4 g/dL — AB (ref 32.0–36.0)
MCV: 67.9 fL — AB (ref 80.0–100.0)
MPV: 9.8 fL (ref 7.5–12.5)
Platelets: 391 10*3/uL (ref 140–400)
RBC: 4.61 MIL/uL (ref 3.80–5.10)
RDW: 16.6 % — ABNORMAL HIGH (ref 11.0–15.0)
WBC: 7.1 10*3/uL (ref 3.8–10.8)

## 2016-04-30 LAB — LIPID PANEL
Cholesterol: 209 mg/dL — ABNORMAL HIGH (ref <200)
HDL: 46 mg/dL — ABNORMAL LOW (ref >50)
LDL Cholesterol: 118 mg/dL — ABNORMAL HIGH (ref <100)
TRIGLYCERIDES: 224 mg/dL — AB (ref <150)
Total CHOL/HDL Ratio: 4.5 Ratio (ref <5.0)
VLDL: 45 mg/dL — ABNORMAL HIGH (ref <30)

## 2016-04-30 LAB — IRON: IRON: 20 ug/dL — AB (ref 45–160)

## 2016-04-30 LAB — TSH: TSH: 6.1 m[IU]/L — AB

## 2016-04-30 LAB — FERRITIN: FERRITIN: 3 ng/mL — AB (ref 20–288)

## 2016-04-30 LAB — VITAMIN B12: VITAMIN B 12: 470 pg/mL (ref 200–1100)

## 2016-05-18 ENCOUNTER — Ambulatory Visit: Payer: Medicare HMO | Admitting: Family Medicine

## 2016-05-19 ENCOUNTER — Ambulatory Visit (INDEPENDENT_AMBULATORY_CARE_PROVIDER_SITE_OTHER): Payer: Medicare HMO | Admitting: Family Medicine

## 2016-05-19 ENCOUNTER — Encounter: Payer: Self-pay | Admitting: Family Medicine

## 2016-05-19 VITALS — BP 120/68 | HR 86 | Temp 98.5°F | Wt 194.0 lb

## 2016-05-19 DIAGNOSIS — G2581 Restless legs syndrome: Secondary | ICD-10-CM

## 2016-05-19 DIAGNOSIS — R0602 Shortness of breath: Secondary | ICD-10-CM | POA: Diagnosis not present

## 2016-05-19 DIAGNOSIS — E785 Hyperlipidemia, unspecified: Secondary | ICD-10-CM

## 2016-05-19 DIAGNOSIS — D509 Iron deficiency anemia, unspecified: Secondary | ICD-10-CM | POA: Diagnosis not present

## 2016-05-19 DIAGNOSIS — I4891 Unspecified atrial fibrillation: Secondary | ICD-10-CM | POA: Diagnosis not present

## 2016-05-19 DIAGNOSIS — E039 Hypothyroidism, unspecified: Secondary | ICD-10-CM | POA: Diagnosis not present

## 2016-05-19 HISTORY — DX: Unspecified atrial fibrillation: I48.91

## 2016-05-19 MED ORDER — ATORVASTATIN CALCIUM 40 MG PO TABS: 40.0000 mg | ORAL_TABLET | Freq: Every day | ORAL | 3 refills | Status: DC

## 2016-05-19 MED ORDER — FERROUS SULFATE 325 (65 FE) MG PO TABS: 325.0000 mg | ORAL_TABLET | Freq: Two times a day (BID) | ORAL | 3 refills | Status: DC

## 2016-05-19 NOTE — Patient Instructions (Signed)
It was great seeing you today! We have addressed the following issues today  1. We will start you on iron pill since your iron is very low and you are anemic as well. This treatment should make your breathing better. 2. I will refer you to a heart doctor to further evaluate your afib 3. We will order Free T4 and T3 to check your thyroid  If we did any lab work today, and the results require attention, either me or my nurse will get in touch with you. If everything is normal, you will get a letter in mail and a message via . If you don't hear from [place] in two weeks, please give [place] a call. Otherwise, we look forward to seeing you again at your next visit. If you have any questions or concerns before then, please call the clinic at [phone].  Please bring all your medications to every doctors visit  Sign up for My Chart to have easy access to your labs results, and communication with your Primary care physician.    Please check-out at the front desk before leaving the clinic.    Take Care,   Dr. Andy Gauss

## 2016-05-19 NOTE — Progress Notes (Signed)
Subjective:    Patient ID: [name], female    DOB: 12-13-1933, 83 y.o.   MRN: [id number]   CC: Follow up for Dyspnea   HPI: Kristen Macias is 83 y.o. female with past medical history significant for COPD, GERD, atrial fibrillation, DM2, HLD, osteoporosis and restless leg syndrome who presents today for follow up for shortness of breath. Patient endorses worsening shortness of breath since last visit. Patient was started on Spiriva at last visit and she has been using it with minimal change in her symptoms. Patient report that even making her bed she has to stop to catch her breath. Patient reports improvement in regards to palpitations she was experiencing last time she was seen in clinic. Patient continue to endorse difficulty sleeping due to her restless leg syndrome. Patient denies any chest pain, dizziness, fall, cough, lower extremity swelling or abdominal pain.   Smoking status reviewed   ROS: all other systems were reviewed and are negative other than in the HPI   Past Medical History:  Diagnosis Date  . GERD (gastroesophageal reflux disease)   . Pneumonia April 2012     Past Surgical History:  Procedure Laterality Date  . APPENDECTOMY    . Brother cut off fingers with axe as child     Accidental  . Rotator cuff  2001  . TONSILLECTOMY    . TOTAL ABDOMINAL HYSTERECTOMY W/ BILATERAL SALPINGOOPHORECTOMY  Long time ago   Objective:  BP 120/68   Pulse 86   Temp 98.5 F (36.9 C) (Oral)   Wt 194 lb (88 kg)   SpO2 99%   BMI 34.37 kg/m   Vitals and nursing note reviewed  General: NAD, pleasant, able to participate in exam Cardiac:irregular heart rhythm, normal heart sounds, no murmurs. 2+ radial and PT pulses bilaterally  Respiratory: , normal effort, No wheezes, rales or rhonchi Abdomen: soft, nontender, nondistended, no hepatic or splenomegaly, +BS Extremities: no edema or cyanosis. WWP. Skin: warm and dry, no rashes noted Neuro: alert and oriented x4, no focal  deficits Psych: Normal affect and mood   Assessment & Plan:   #Dyspnea, chronic  Patient CBC showed Hemoglobin of 9.5 and iron panel was consistent with iron deficiency with a iron of 20 and a ferritin 3. Though patient has a history of COPD and was started on Spiriva at last visit symptoms have worsened. Dyspnea most likely secondary to anemia in the setting of severe iron deficiency. Patient was also diagnosed with atrial fibrillation at last visit and started on metoprolol and eliquis. Patient does not have exam finding concerning for CHF, no crackles and no lower extremity edema. Oxygen saturation is 99% at today's visit. Last echo was in 2015 with a EF of 55%-60%. Patient could benefit from a Cardiology consult with repeat echo. --Will start patient on 325 mg Ferrous sulfate BID, hx of constipation, PCP will titrate --Referral to Cardiology for Echo and new afib  #Atrial Fibrillation, chronic  Appear to be well controlled from a rate standpoint, Patient currently on metoprolol and Eliquis. Patient seemed to be tolerating medications and reports that palpitations have greatly improved. Rate is within normal limit but rhythm is still irregular. Given new diagnosis of Afib patient will benefit from a cardiology consult to assess need for cardioversion though with her age and comorbidities do not anticipate aggressive treatment plan.  --Continue Metoprolol 50 mg daily --Continue Eliquis 5 mg bid --Will refer to Cardiology  #Restless leg syndrome, chronic  Patient continue to complain  of severe leg pain preventing her from sleeping. Iron panel finding most consistent with severity of symptoms. B12 was also checked and was 470, within normal limit. Patient has been on PPI and metformin for many years. Expect improvement in symptoms with current iron repletion plan. --Will follow up on symptoms --Continue Aleve as needed  #Hyperlipidemia Panel showed a cholesterol 209, HDL 46, LDL 118,  Triglycerides 224. Given age and co morbidities will continue current treatment plan.  --Continue atorvastatin 40 mg daily  #Elevated TSH TSH was 6.10. Patient report weight gain, hair loss and cold sensitivity. Most likely new diagnosis of hypothyroidism. TSH could be also elevated for other reasons. Will hold off on Levothyroxine until T4 results. --Will order free T4 to confirm diagnosis. --Will follow up with PCP  Marjie Skiff, MD Family Medicine Resident PGY-1

## 2016-05-20 LAB — T4, FREE: FREE T4: 1.13 ng/dL (ref 0.82–1.77)

## 2016-06-11 ENCOUNTER — Other Ambulatory Visit: Payer: Self-pay | Admitting: Family Medicine

## 2016-06-11 DIAGNOSIS — J449 Chronic obstructive pulmonary disease, unspecified: Secondary | ICD-10-CM

## 2016-06-12 ENCOUNTER — Other Ambulatory Visit: Payer: Self-pay | Admitting: Family Medicine

## 2016-06-12 DIAGNOSIS — I482 Chronic atrial fibrillation, unspecified: Secondary | ICD-10-CM

## 2016-06-14 NOTE — Progress Notes (Signed)
Cardiology Office Note   Date:  06/15/2016   ID:  ROYALTY FAKHOURI, DOB 1934/01/04, MRN [id number]  PCP:  Evette Doffing, MD    No chief complaint on file. atrial fibrillation   Wt Readings from Last 3 Encounters:  06/15/16 193 lb 3.2 oz (87.6 kg)  05/19/16 194 lb (88 kg)  04/28/16 192 lb (87.1 kg)       History of Present Illness:  Kristen Macias is a 83 y.o. female who is being seen today for the evaluation of atrial fibrillation at the request of Marjie Skiff, MD.  She was having palpitations in early March.  ECG showed AFib.  SHe was started on metoprolol and Eliquis.    Palpitations have decreased since starting the medicines.  No bleeding problems.    She no longer drives.     Past Medical History:  Diagnosis Date  . Atrial fibrillation ([place]) 05/19/2016  . GERD (gastroesophageal reflux disease)   . Pneumonia April 2012    Past Surgical History:  Procedure Laterality Date  . APPENDECTOMY    . Brother cut off fingers with axe as child     Accidental  . Rotator cuff  2001  . TONSILLECTOMY    . TOTAL ABDOMINAL HYSTERECTOMY W/ BILATERAL SALPINGOOPHORECTOMY  Long time ago     Current Outpatient Prescriptions  Medication Sig Dispense Refill  . ELIQUIS 5 MG TABS tablet TAKE 1 TABLET TWICE DAILY 120 tablet 1  . ferrous sulfate (FERROUSUL) 325 (65 FE) MG tablet Take 1 tablet (325 mg total) by mouth 2 (two) times daily with a meal. 60 tablet 3  . omeprazole (PRILOSEC) 20 MG capsule TAKE 1 CAPSULE TWICE DAILY  BEFORE  A  MEAL 180 capsule 2  . polyethylene glycol powder (GLYCOLAX/MIRALAX) powder Take 17 g by mouth daily. 3350 g 1  . SPIRIVA HANDIHALER 18 MCG inhalation capsule INHALE THE CONTENTS OF 1 CAPSULE EVERY DAY 60 capsule 1   No current facility-administered medications for this visit.     Allergies:   Patient has no known allergies.    Social History:  The patient  reports that she quit smoking about 6 years ago. Her smoking use included Cigarettes.  She started smoking about 64 years ago. She smoked 0.30 packs per day. She has never used smokeless tobacco. She reports that she does not drink alcohol or use drugs.   Family History:  The patient's family history includes Alzheimer's disease in her mother; Brain cancer in her brother; Diabetes in her brother; Heart disease in her brother, brother, father, and mother; Lung cancer in her brother.    ROS:  Please see the history of present illness.   Otherwise, review of systems are positive for reduced palpitations.   All other systems are reviewed and negative.    PHYSICAL EXAM: VS:  BP 110/80   Pulse 81   Ht 5\' 3"  (1.6 m)   Wt 193 lb 3.2 oz (87.6 kg)   SpO2 98%   BMI 34.22 kg/m  , BMI Body mass index is 34.22 kg/m. GEN: Well nourished, well developed, in no acute distress  HEENT: normal  Neck: no JVD, carotid bruits, or masses Cardiac: irregularly irregular; no murmurs, rubs, or gallops,no edema  Respiratory:  clear to auscultation bilaterally, normal work of breathing GI: soft, nontender, nondistended, + BS MS: no deformity or atrophy  Skin: warm and dry, no rash Neuro:  Strength and sensation are intact Psych: euthymic mood, full affect  EKG:   The ekg ordered 3/6 demonstrates AFib with controlled ventricular response   Recent Labs: 04/30/2016: ALT 11; BUN 18; Creat 1.15; Hemoglobin 9.5; Platelets 391; Potassium 4.7; Sodium 138; TSH 6.10   Lipid Panel    Component Value Date/Time   CHOL 209 (H) 04/30/2016 1023   TRIG 224 (H) 04/30/2016 1023   HDL 46 (L) 04/30/2016 1023   CHOLHDL 4.5 04/30/2016 1023   VLDL 45 (H) 04/30/2016 1023   LDLCALC 118 (H) 04/30/2016 1023     Other studies Reviewed: Additional studies/ records that were reviewed today with results demonstrating: prior ECG.   ASSESSMENT AND PLAN:  1. AFib: Rate controlled. We discussed cardioversion. Given that she does not have symptoms, she is not interested in pursuing cardioversion at this time. This  is reasonable. Continue Eliquis for stroke prevention. We'll check echocardiogram to evaluate for any structural heart disease. Continue metoprolol for rate control. 2. I encouraged her to try to get more exercise and to continue to eat healthy. She does admit that she eats a fair amount of sweets.  If symptoms come back, would pursue cardioversion. She has been anticoagulated for a long enough time. This patients CHA2DS2-VASc Score and unadjusted Ischemic Stroke Rate (% per year) is equal to 4.8 % stroke rate/year from a score of 4  Above score calculated as 1 point each if present [CHF, HTN, DM, Vascular=MI/PAD/Aortic Plaque, Age if 65-74, or Female] Above score calculated as 2 points each if present [Age > 75, or Stroke/TIA/TE]  3. Prior DVT- was on Xarelto. Left femoral area per her report. 4.    Current medicines are reviewed at length with the patient today.  The patient concerns regarding her medicines were addressed.  The following changes have been made:  No change  Labs/ tests ordered today include:  No orders of the defined types were placed in this encounter.   Recommend 150 minutes/week of aerobic exercise Low fat, low carb, high fiber diet recommended  Disposition:   FU in 4 months   Signed, Larae Grooms, MD  06/15/2016 10:06 AM    [facility] [place], [place], [place]  [address] Phone: [phone]; Fax: [phone]

## 2016-06-15 ENCOUNTER — Encounter: Payer: Self-pay | Admitting: Interventional Cardiology

## 2016-06-15 ENCOUNTER — Ambulatory Visit (INDEPENDENT_AMBULATORY_CARE_PROVIDER_SITE_OTHER): Payer: Medicare HMO | Admitting: Interventional Cardiology

## 2016-06-15 VITALS — BP 110/80 | HR 81 | Ht 63.0 in | Wt 193.2 lb

## 2016-06-15 DIAGNOSIS — I10 Essential (primary) hypertension: Secondary | ICD-10-CM

## 2016-06-15 DIAGNOSIS — I481 Persistent atrial fibrillation: Secondary | ICD-10-CM | POA: Diagnosis not present

## 2016-06-15 DIAGNOSIS — I82412 Acute embolism and thrombosis of left femoral vein: Secondary | ICD-10-CM | POA: Diagnosis not present

## 2016-06-15 DIAGNOSIS — I4819 Other persistent atrial fibrillation: Secondary | ICD-10-CM

## 2016-06-15 NOTE — Patient Instructions (Signed)
Your physician recommends that you continue on your current medications as directed. Please refer to the Current Medication list given to you today.  Your physician has requested that you have an echocardiogram. Echocardiography is a painless test that uses sound waves to create images of your heart. It provides your doctor with information about the size and shape of your heart and how well your heart's chambers and valves are working. This procedure takes approximately one hour. There are no restrictions for this procedure.   Your physician recommends that you schedule a follow-up appointment in:  [place]

## 2016-06-26 ENCOUNTER — Other Ambulatory Visit (HOSPITAL_COMMUNITY): Payer: Medicare HMO

## 2016-07-30 ENCOUNTER — Other Ambulatory Visit: Payer: Self-pay | Admitting: Internal Medicine

## 2016-08-05 ENCOUNTER — Other Ambulatory Visit (HOSPITAL_COMMUNITY): Payer: Medicare HMO

## 2016-08-06 ENCOUNTER — Telehealth (HOSPITAL_COMMUNITY): Payer: Self-pay | Admitting: Interventional Cardiology

## 2016-08-06 NOTE — Telephone Encounter (Signed)
Patient cancelled appt on 06/26/16 and 07/31/16.

## 2016-08-10 ENCOUNTER — Telehealth (HOSPITAL_COMMUNITY): Payer: Self-pay | Admitting: Interventional Cardiology

## 2016-08-10 NOTE — Telephone Encounter (Signed)
User: Cherie Dark A Date/time: 08/10/2016 2:45 PM  Comment: Called pt and spoke with her to see if she wanted to r/s appt and she voiced that she did not have transportation.   Context: [place] Schedule Orders/Appt Requests Outcome: Completed  Phone number: [phone] Phone Type: Home Phone  Comm. type: Telephone Call type: Outgoing  Contact: [place] E Relation to patient: Self  Letter:       Pt cancelled appts on 5/4 and 6/13

## 2016-08-17 ENCOUNTER — Ambulatory Visit: Payer: Medicare HMO | Admitting: Family Medicine

## 2016-08-17 ENCOUNTER — Other Ambulatory Visit: Payer: Self-pay | Admitting: Internal Medicine

## 2016-08-17 DIAGNOSIS — J449 Chronic obstructive pulmonary disease, unspecified: Secondary | ICD-10-CM

## 2016-08-17 DIAGNOSIS — I482 Chronic atrial fibrillation, unspecified: Secondary | ICD-10-CM

## 2016-08-20 ENCOUNTER — Encounter: Payer: Self-pay | Admitting: Family Medicine

## 2016-08-20 ENCOUNTER — Ambulatory Visit (INDEPENDENT_AMBULATORY_CARE_PROVIDER_SITE_OTHER): Payer: Medicare HMO | Admitting: Family Medicine

## 2016-08-20 VITALS — BP 102/72 | HR 79 | Temp 98.1°F | Ht 63.0 in | Wt 187.6 lb

## 2016-08-20 DIAGNOSIS — J449 Chronic obstructive pulmonary disease, unspecified: Secondary | ICD-10-CM

## 2016-08-20 DIAGNOSIS — R609 Edema, unspecified: Secondary | ICD-10-CM | POA: Diagnosis not present

## 2016-08-20 DIAGNOSIS — R0609 Other forms of dyspnea: Secondary | ICD-10-CM | POA: Diagnosis not present

## 2016-08-20 DIAGNOSIS — G2581 Restless legs syndrome: Secondary | ICD-10-CM | POA: Diagnosis not present

## 2016-08-20 MED ORDER — TIOTROPIUM BROMIDE MONOHYDRATE 18 MCG IN CAPS: ORAL_CAPSULE | RESPIRATORY_TRACT | 1 refills | Status: DC

## 2016-08-20 NOTE — Patient Instructions (Signed)
Thank you so much for coming to visit today! We will check some labs to evaluate your swelling. Please follow up with your Cardiologist as scheduled. You may try Tonic Water, Mustard, or Pickle Juice for your swelling. If these do not work, we will consider additional medications.  Dr. Gerlean Ren  Restless Legs Syndrome Restless legs syndrome is a condition that causes uncomfortable feelings or sensations in the legs, especially while sitting or lying down. The sensations usually cause an overwhelming urge to move the legs. The arms can also sometimes be affected. The condition can range from mild to severe. The symptoms often interfere with a person's ability to sleep. What are the causes? The cause of this condition is not known. What increases the risk? This condition is more likely to develop in:  People who are older than age 19.  Pregnant women. In general, restless legs syndrome is more common in women than in men.  People who have a family history of the condition.  People who have certain medical conditions, such as iron deficiency, kidney disease, Parkinson disease, or nerve damage.  People who take certain medicines, such as medicines for high blood pressure, nausea, colds, allergies, depression, and some heart conditions.  What are the signs or symptoms? The main symptom of this condition is uncomfortable sensations in the legs. These sensations may be:  Described as pulling, tingling, prickling, throbbing, crawling, or burning.  Worse while you are sitting or lying down.  Worse during periods of rest or inactivity.  Worse at night, often interfering with your sleep.  Accompanied by a very strong urge to move your legs.  Temporarily relieved by movement of your legs.  The sensations usually affect both sides of the body. The arms can also be affected, but this is rare. People who have this condition often have tiredness during the day because of their lack of sleep at  night. How is this diagnosed? This condition may be diagnosed based on your description of the symptoms. You may also have tests, including blood tests, to check for other conditions that may lead to your symptoms. In some cases, you may be asked to spend some time in a sleep lab so your sleeping can be monitored. How is this treated? Treatment for this condition is focused on managing the symptoms. Treatment may include:  Self-help and lifestyle changes.  Medicines.  Follow these instructions at home:  Take medicines only as directed by your health care provider.  Try these methods to get temporary relief from the uncomfortable sensations: ? Massage your legs. ? Walk or stretch. ? Take a cold or hot bath.  Practice good sleep habits. For example, go to bed and get up at the same time every day.  Exercise regularly.  Practice ways of relaxing, such as yoga or meditation.  Avoid caffeine and alcohol.  Do not use any tobacco products, including cigarettes, chewing tobacco, or electronic cigarettes. If you need help quitting, ask your health care provider.  Keep all follow-up visits as directed by your health care provider. This is important. Contact a health care provider if: Your symptoms do not improve with treatment, or they get worse. This information is not intended to replace advice given to you by your health care provider. Make sure you discuss any questions you have with your health care provider. Document Released: 01/30/2002 Document Revised: 07/18/2015 Document Reviewed: 02/05/2014 Elsevier Interactive Patient Education  [employer].

## 2016-08-20 NOTE — Progress Notes (Signed)
Subjective:     Patient ID: Kristen Macias, female   DOB: 31-May-1933, 83 y.o.   MRN: [id number]  HPI Mrs. Fariss is a 83 year old female presenting today for leg Swelling. Long History of Leg Cramps at Night. Previously Diagnosed with Restless Leg Syndrome. Unable to Tolerate Gabapentin in the past due to Syncope. Also notes new onset bilateral leg edema for the last month. Notes swelling up to the knee. Reports swelling is worse and that the day and worse if she is on her feet a lot during the day. Denies shortness of breath or chest pain. Denies orthopnea or paroxysmal nocturnal dyspnea. History of atrial fibrillation noted. Scheduled for echocardiogram on July 3. Follows with cardiology. History of DVT in left groin several years ago, currently on Eliquis. History of COPD are noted, currently prescribed Spirva--requests refill. Former smoker.  Review of Systems Per HPI    Objective:   Physical Exam  Constitutional: She appears well-developed and well-nourished. No distress.  Cardiovascular: Normal rate and regular rhythm.   Murmur heard. Pulmonary/Chest: Effort normal. No respiratory distress. She has no wheezes.  Musculoskeletal:  2+ pitting edema to knee bilaterally. Lower extremities equal in size.  Skin: No rash noted.  Psychiatric: She has a normal mood and affect. Her behavior is normal.      Assessment and Plan:     1. Edema, unspecified type Given new onset edema, will check CMP, CBC, TSH, and BNP. Echocardiogram arteries scheduled for July 3 per cardiology. Recommend elevation and compression stockings. Follow up pending labs.  2. Restless leg syndrome Has not tolerated gabapentin in the past. Does not wish to try Lyrica at this time. Recommend home remedies, such as tonic water, mustard, pickle juice. If no improvement with home remedies may try Lyrica. Follow-up if no improvement.  3. Chronic obstructive pulmonary disease, unspecified COPD type ([place]) Stable. Spiriva  refilled.

## 2016-08-21 ENCOUNTER — Telehealth: Payer: Self-pay | Admitting: Family Medicine

## 2016-08-21 LAB — CBC
HEMATOCRIT: 26.8 % — AB (ref 34.0–46.6)
HEMOGLOBIN: 8.5 g/dL — AB (ref 11.1–15.9)
MCH: 20.6 pg — ABNORMAL LOW (ref 26.6–33.0)
MCHC: 31.7 g/dL (ref 31.5–35.7)
MCV: 65 fL — AB (ref 79–97)
Platelets: 382 10*3/uL — ABNORMAL HIGH (ref 150–379)
RBC: 4.12 x10E6/uL (ref 3.77–5.28)
RDW: 18.5 % — AB (ref 12.3–15.4)
WBC: 8.2 10*3/uL (ref 3.4–10.8)

## 2016-08-21 LAB — CMP14+EGFR
ALBUMIN: 4.1 g/dL (ref 3.5–4.7)
ALK PHOS: 105 IU/L (ref 39–117)
ALT: 12 IU/L (ref 0–32)
AST: 16 IU/L (ref 0–40)
Albumin/Globulin Ratio: 1.3 (ref 1.2–2.2)
BUN / CREAT RATIO: 17 (ref 12–28)
BUN: 18 mg/dL (ref 8–27)
Bilirubin Total: 0.2 mg/dL (ref 0.0–1.2)
CO2: 20 mmol/L (ref 20–29)
CREATININE: 1.09 mg/dL — AB (ref 0.57–1.00)
Calcium: 9.1 mg/dL (ref 8.7–10.3)
Chloride: 105 mmol/L (ref 96–106)
GFR calc Af Amer: 55 mL/min/{1.73_m2} — ABNORMAL LOW (ref >59)
GFR calc non Af Amer: 48 mL/min/{1.73_m2} — ABNORMAL LOW (ref >59)
GLUCOSE: 121 mg/dL — AB (ref 65–99)
Globulin, Total: 3.2 g/dL (ref 1.5–4.5)
Potassium: 4.9 mmol/L (ref 3.5–5.2)
Sodium: 140 mmol/L (ref 134–144)
Total Protein: 7.3 g/dL (ref 6.0–8.5)

## 2016-08-21 LAB — TSH: TSH: 4.46 u[IU]/mL (ref 0.450–4.500)

## 2016-08-21 LAB — BRAIN NATRIURETIC PEPTIDE: BNP: 170.7 pg/mL — AB (ref 0.0–100.0)

## 2016-08-21 NOTE — Telephone Encounter (Signed)
Attempted to contact to discuss lab results without response. Please let her know that her labs show worsening anemia. Recommend she schedule an appointment with her PCP to discuss this. She should take her Iron as prescribed at her office visit in March. One lab is still pending.  Dr. Gerlean Ren

## 2016-08-25 ENCOUNTER — Other Ambulatory Visit: Payer: Self-pay | Admitting: Interventional Cardiology

## 2016-08-25 ENCOUNTER — Ambulatory Visit (HOSPITAL_COMMUNITY): Payer: Medicare HMO | Attending: Cardiovascular Disease

## 2016-08-25 ENCOUNTER — Other Ambulatory Visit: Payer: Self-pay

## 2016-08-25 DIAGNOSIS — I4819 Other persistent atrial fibrillation: Secondary | ICD-10-CM

## 2016-08-25 DIAGNOSIS — I481 Persistent atrial fibrillation: Secondary | ICD-10-CM

## 2016-08-25 DIAGNOSIS — I517 Cardiomegaly: Secondary | ICD-10-CM

## 2016-08-25 DIAGNOSIS — I420 Dilated cardiomyopathy: Secondary | ICD-10-CM | POA: Diagnosis not present

## 2016-08-25 DIAGNOSIS — I071 Rheumatic tricuspid insufficiency: Secondary | ICD-10-CM

## 2016-08-25 DIAGNOSIS — I051 Rheumatic mitral insufficiency: Secondary | ICD-10-CM | POA: Insufficient documentation

## 2016-08-25 DIAGNOSIS — I4891 Unspecified atrial fibrillation: Secondary | ICD-10-CM | POA: Diagnosis present

## 2016-08-25 DIAGNOSIS — I340 Nonrheumatic mitral (valve) insufficiency: Secondary | ICD-10-CM

## 2016-08-25 HISTORY — DX: Cardiomegaly: I51.7

## 2016-08-25 HISTORY — DX: Rheumatic tricuspid insufficiency: I07.1

## 2016-08-25 HISTORY — DX: Nonrheumatic mitral (valve) insufficiency: I34.0

## 2016-08-25 NOTE — Telephone Encounter (Signed)
LVM for pt to call the office. If she calls, please inform her of the below information and schedule her an appt with Dr. Brett Albino. Ottis Stain, CMA

## 2016-08-31 NOTE — Telephone Encounter (Signed)
Pt informed and appt made for 09/09/16. Kalaya Infantino, Salome Spotted, CMA

## 2016-09-09 ENCOUNTER — Encounter: Payer: Self-pay | Admitting: Internal Medicine

## 2016-09-09 ENCOUNTER — Ambulatory Visit (INDEPENDENT_AMBULATORY_CARE_PROVIDER_SITE_OTHER): Payer: Medicare HMO | Admitting: Internal Medicine

## 2016-09-09 ENCOUNTER — Ambulatory Visit: Payer: Medicare HMO | Admitting: Family Medicine

## 2016-09-09 DIAGNOSIS — D649 Anemia, unspecified: Secondary | ICD-10-CM | POA: Diagnosis not present

## 2016-09-09 DIAGNOSIS — R600 Localized edema: Secondary | ICD-10-CM

## 2016-09-09 DIAGNOSIS — J449 Chronic obstructive pulmonary disease, unspecified: Secondary | ICD-10-CM | POA: Diagnosis not present

## 2016-09-09 MED ORDER — DOCUSATE SODIUM 100 MG PO CAPS: 100.0000 mg | ORAL_CAPSULE | Freq: Two times a day (BID) | ORAL | 0 refills | Status: DC

## 2016-09-09 MED ORDER — ALBUTEROL SULFATE HFA 108 (90 BASE) MCG/ACT IN AERS: 1.0000 | INHALATION_SPRAY | Freq: Four times a day (QID) | RESPIRATORY_TRACT | 2 refills | Status: DC | PRN

## 2016-09-09 NOTE — Patient Instructions (Signed)
It was so nice to see you!  For your COPD: Please continue taking the Spiriva once daily. I have prescribed an Albuterol inhaler. Please use 1-2 puffs every 6 hours as needed for shortness of breath.  For your anemia: Please restart the iron pills. I have prescribed a stool softener called Colace. You can use the Colace up to three times per day to keep your stools soft.  For your leg swelling: Continue wearing compression stockings and keeping your legs elevated as much as possible during the day. If this continues to be a problem, we can talk about starting a medication to help with the fluid.  We will see you back in 3 months!  -Dr. Brett Albino

## 2016-09-10 DIAGNOSIS — R600 Localized edema: Secondary | ICD-10-CM | POA: Insufficient documentation

## 2016-09-10 NOTE — Progress Notes (Signed)
[facility] Phone: [phone]  Subjective:  Kaye is an 83 year old female presenting to clinic for follow-up of her chronic medical conditions.  LE edema: Seen in clinic for this on 6/28. Had labs (CMP, CBC, TSH, BNP) and ECHO done at that visit. She was advised to use compression stockings and keep her legs elevated. Labs were remarkable for a BNP of 170 (no previous BNPs for comparison) and Hgb of 8.5 (MCV 65). ECHO showed an EF of 50-55%, mild MR, biatrial enlargement, and mild TR. She has been keeping her legs elevated as much as possible and has been using compression stockings. She feels like her leg swelling has improved slightly. She endorses some mild bilateral leg pain "only when her legs get really tight".   COPD: Taking Spiriva twice a day. Feels like it is not working because she becomes short of breath while walking to the mailbox every day. She often has to stop to catch her breath. No wheezing. Had taken Albuterol for a short period of time in the past, but ran out of the prescription and never called for a new prescription. No chest pain. No fevers, no chills. No orthopnea. No cough. No sputum production.  Microcytic anemia: Was supposed to be taking iron, but she stopped taking this because it was making her constipated. No palpitations. Endorses mild fatigue.  ROS: See HPI for pertinent positives and negatives  Past Medical History- Atrial fibrillation on chronic anticoagulation with Eliquis, hx DVT, COPD, osteoporosis, prediabetes, HLD  Family history reviewed for today's visit. No changes.  Social history- patient is a former smoker  Objective: BP 134/70   Pulse 78   Temp 98.5 F (36.9 C) (Oral)   Ht 5\' 3"  (1.6 m)   Wt 188 lb (85.3 kg)   SpO2 98%   BMI 33.30 kg/m  Gen: NAD, alert, cooperative with exam HEENT: NCAT, EOMI, MMM, pale conjunctiva Neck: FROM, supple CV: Irregularly irregular rhythm, regular rate, no murmurs Resp:  CTABL, no wheezes, normal work of breathing, good air movement throughout all lung fields Msk: Very mild pitting edema to the mid-shin bilaterally, warm and well-perfused. Neuro: Alert and oriented, no gross deficits Skin: No rashes, no lesions Psych: Appropriate behavior  Assessment/Plan: Lower extremity edema: Recent labs and ECHO unremarkable except for mildly elevated BNP to 170 and microcytic anemia with a Hgb of 8.5. Patient does not feel that edema has improved, but per the last clinic note, her edema was extending all the way up to her knees. On exam today, she has very mild edema extending to the mid shin bilaterally.  - Continue conservative management with compression stockings and elevating the legs - Encouraged low salt diet - Can consider adding Lasix in the future if needed  COPD: Overall pretty well-controlled but patient does note shortness of breath while walking to the mailbox. No signs of COPD exacerbation today. - Patient using Spiriva twice a day- advised her to use this once a day - Prescribed Albuterol prn for shortness of breath - Follow-up in 3 months  Microcytic Anemia: Recent labs with Hgb 8.5 and MCV 65. Likely iron deficiency anemia. Has been prescribed iron pills in the past, but is not taking them because they cause constipation. Last colonoscopy was in 2012 and showed left-sided diverticulosis.  - Restart ferrous sulfate 325mg  bid - Prescribed Colace 2-3 times per day prn for constipation - Follow-up in 3 months for repeat CBC.  - Will discuss possible colonoscopy at  that time   Hyman Bible, MD PGY-3

## 2016-09-10 NOTE — Assessment & Plan Note (Signed)
Overall pretty well-controlled but patient does note shortness of breath while walking to the mailbox. No signs of COPD exacerbation today. - Patient using Spiriva twice a day- advised her to use this once a day - Prescribed Albuterol prn for shortness of breath - Follow-up in 3 months

## 2016-09-10 NOTE — Assessment & Plan Note (Signed)
Recent labs and ECHO unremarkable except for mildly elevated BNP to 170 and microcytic anemia with a Hgb of 8.5. Patient does not feel that edema has improved, but per the last clinic note, her edema was extending all the way up to her knees. On exam today, she has very mild edema extending to the mid shin bilaterally.  - Continue conservative management with compression stockings and elevating the legs - Encouraged low salt diet - Can consider adding Lasix in the future if needed

## 2016-09-10 NOTE — Assessment & Plan Note (Addendum)
Recent labs with Hgb 8.5 and MCV 65. Likely iron deficiency anemia. Has been prescribed iron pills in the past, but is not taking them because they cause constipation. Last colonoscopy was in 2012 and showed left-sided diverticulosis.  - Restart ferrous sulfate 325mg  bid - Prescribed Colace 2-3 times per day prn for constipation - Follow-up in 3 months for repeat CBC.  - Will discuss possible colonoscopy at that time

## 2016-09-15 ENCOUNTER — Other Ambulatory Visit: Payer: Self-pay | Admitting: Family Medicine

## 2016-09-15 ENCOUNTER — Telehealth: Payer: Self-pay | Admitting: Internal Medicine

## 2016-09-15 NOTE — Telephone Encounter (Signed)
Pt would like [place] to call [employer] about the prescriptions we sent in last week 09/09/16, She said that she has not received them and wants to make sure that the pharmacy dis receive them Please call patient back and let her know. Please call her at [phone]. [email or web address]

## 2016-09-15 NOTE — Telephone Encounter (Signed)
Will resent scripts to pharmacy and patient notified. Isaul Landi,CMA

## 2016-10-16 ENCOUNTER — Ambulatory Visit: Payer: Medicare HMO | Admitting: Interventional Cardiology

## 2016-10-28 ENCOUNTER — Other Ambulatory Visit: Payer: Self-pay | Admitting: Internal Medicine

## 2016-11-02 ENCOUNTER — Encounter: Payer: Self-pay | Admitting: Internal Medicine

## 2016-11-02 ENCOUNTER — Ambulatory Visit (INDEPENDENT_AMBULATORY_CARE_PROVIDER_SITE_OTHER): Payer: Medicare HMO | Admitting: Internal Medicine

## 2016-11-02 VITALS — BP 116/72 | HR 87 | Temp 98.0°F | Wt 188.0 lb

## 2016-11-02 DIAGNOSIS — J441 Chronic obstructive pulmonary disease with (acute) exacerbation: Secondary | ICD-10-CM

## 2016-11-02 DIAGNOSIS — Z23 Encounter for immunization: Secondary | ICD-10-CM

## 2016-11-02 MED ORDER — PREDNISONE 20 MG PO TABS: 40.0000 mg | ORAL_TABLET | Freq: Every day | ORAL | 0 refills | Status: DC

## 2016-11-02 NOTE — Patient Instructions (Signed)
It was wonderful to see you!  I think you are having a mild COPD exacerbation, or a mild worsening of your COPD. This may have been caused by a change in the weather.  I have prescribed you some Prednisone. Please take 2 tablets (40mg  total) each morning for 5 days.  -Dr. Brett Albino

## 2016-11-02 NOTE — Progress Notes (Signed)
   [facility] Phone: [phone]  Subjective:  Kristen Macias is an 83 year old female presenting to clinic with productive cough for the last week. Cough is productive of green sputum. The cough is worse in the morning, but occurs throughout the whole day. The cough is often worse after using her Albuterol. She has noticed mild shortness of breath with exertion. She denies any fevers, sore throat, runny nose, congestion, chest pain, or lower extremity edema.  ROS: See HPI for pertinent positives and negatives  Past Medical History- A-fib, hx DVT, COPD, GERD, osteoporosis w/ hx of compression fracture, anemia, HLD, prediabetes.  Family history reviewed for today's visit. No changes.  Social history- patient is a former smoker, quit in 2012.  Objective: BP 116/72   Pulse 87   Temp 98 F (36.7 C) (Oral)   Wt 188 lb (85.3 kg)   SpO2 98%   BMI 33.30 kg/m  Gen: NAD, alert, cooperative with exam HEENT: NCAT, EOMI, MMM Neck: FROM, supple CV: RRR, no murmur Resp: Diffuse expiratory wheezing throughout all lung fields, good air movement throughout, normal work of breathing Msk: No edema, warm, normal tone, moves UE/LE spontaneously Neuro: Alert and oriented, no gross deficits Skin: No rashes, no lesions Psych: Appropriate behavior  Assessment/Plan: COPD exacerbation, mild: Patient with mildly worsened SOB and cough productive of green sputum, consistent with a mild COPD exacerbation. She has normal WOB on exam and O2 is 98%. No fevers or focal lung findings to suggest pneumonia. No crackles in the bases or lower extremity edema to suggest another cause such as undiagnosed CHF. - Treat with Prednisone x 5 days - Continue Spiriva daily and Albuterol prn - Flu shot given today - Follow-up if no improvement   Hyman Bible, MD PGY-3

## 2016-11-04 NOTE — Assessment & Plan Note (Addendum)
Patient with mildly worsened SOB and cough productive of green sputum, consistent with a mild COPD exacerbation. She has normal WOB on exam and O2 is 98%. No fevers or focal lung findings to suggest pneumonia. No crackles in the bases or lower extremity edema to suggest another cause such as undiagnosed CHF. - Treat with Prednisone x 5 days - Continue Spiriva daily and Albuterol prn - Flu shot given today - Follow-up if no improvement

## 2016-11-09 ENCOUNTER — Other Ambulatory Visit: Payer: Self-pay | Admitting: Internal Medicine

## 2016-11-10 ENCOUNTER — Encounter: Payer: Self-pay | Admitting: Internal Medicine

## 2016-11-10 ENCOUNTER — Ambulatory Visit (INDEPENDENT_AMBULATORY_CARE_PROVIDER_SITE_OTHER): Payer: Medicare HMO | Admitting: Internal Medicine

## 2016-11-10 DIAGNOSIS — M810 Age-related osteoporosis without current pathological fracture: Secondary | ICD-10-CM

## 2016-11-10 MED ORDER — CALCIUM-VITAMIN D 600-400 MG-UNIT PO TABS: 2.0000 | ORAL_TABLET | Freq: Every day | ORAL | 0 refills | Status: DC

## 2016-11-10 MED ORDER — ALENDRONATE SODIUM 70 MG PO TABS: 70.0000 mg | ORAL_TABLET | ORAL | 0 refills | Status: DC

## 2016-11-10 NOTE — Patient Instructions (Signed)
It was so nice to see you!  I have started you on a medication to help build up your bones. It is called Alendronate (Fosamax). Please take 1 tablet on an empty stomach each week. You should also take 2 tablets daily of the calcium-vitamin D.  -Dr. Brett Albino

## 2016-11-11 MED ORDER — RANITIDINE HCL 150 MG PO CAPS: 150.0000 mg | ORAL_CAPSULE | Freq: Two times a day (BID) | ORAL | 0 refills | Status: DC

## 2016-11-11 NOTE — Assessment & Plan Note (Addendum)
Last DEXA in 2003 showed T score of -2.7. T7 compression fracture occurred in 02/2015, treated with NSAIDs and Tramadol. - Start Alendronate 70mg  weekly - Start Calcium-Vitamin D 1200-800mg  daily - Will not repeat DEXA at this time, as it will not change management - Prilosec discontinued and switched to Zantac - Discussed that we may need to obtain another x-ray of her lumbar spine to make sure she doesn't have another compression fracture. Patient states she would like to hold of this for right now, because her back doesn't hurt that bad. - Follow-up in 3 months

## 2016-11-11 NOTE — Progress Notes (Addendum)
   [facility] Phone: [phone]  Subjective:  Kristen Macias is a 83 year old female presenting to clinic for follow-up of her osteoporosis. Per chart review, her last DEXA was in 2003 and showed a T score of -2.7. She is unsure if she was prescribed any medications at this time. In 02/2015, she was seen in clinic for back pain and was diagnosed with a T7 compression fracture. She was treated with Tramadol and Aleve. She returns today to discuss treatment for osteoporosis. She is not taking any calcium or Vitamin D supplementation at home. She has not had any recent falls. She has never had a hip fracture. She has taken a short course of Prednisone just a few times in her life. She states that her mid back occasionally hurts, but she has not had any new back pain since last year.   ROS: See HPI for pertinent positives and negatives  Past Medical History- A-fib on Eliquis, hx DVT, COPD, GERD, hx T7 compression fracture, osteoporosis, bilateral cataracts, HLD, prediabetes  Family history reviewed for today's visit. No changes.  Social history- patient is a former smoker  Objective: BP 106/64   Pulse 87   Temp 98.8 F (37.1 C) (Oral)   Wt 187 lb (84.8 kg)   SpO2 97%   BMI 33.13 kg/m  Gen: NAD, alert, cooperative with exam Back: +mild tenderness to palpation of the spine in the region of T7-T8.  Assessment/Plan: Osteoporosis: Last DEXA in 2003 showed T score of -2.7. T7 compression fracture occurred in 02/2015, treated with NSAIDs and Tramadol. - Start Alendronate 70mg  weekly - Start Calcium-Vitamin D 1200-800mg  daily - Will not repeat DEXA at this time, as it will not change management - Prilosec discontinued and switched to Zantac - Discussed that we may need to obtain another x-ray of her lumbar spine to make sure she doesn't have another compression fracture. Patient states she would like to hold of this for right now, because her back doesn't hurt that bad. -  Follow-up in 3 months   Hyman Bible, MD PGY-3

## 2016-11-13 ENCOUNTER — Telehealth: Payer: Self-pay | Admitting: *Deleted

## 2016-11-13 NOTE — Telephone Encounter (Signed)
Patient left voice message on nurse line requesting a call from her PCP. Derl Barrow, RN

## 2016-11-14 NOTE — Telephone Encounter (Signed)
Returned patients call. All questions answered.

## 2016-12-09 ENCOUNTER — Encounter: Payer: Self-pay | Admitting: Interventional Cardiology

## 2016-12-09 ENCOUNTER — Ambulatory Visit (INDEPENDENT_AMBULATORY_CARE_PROVIDER_SITE_OTHER): Payer: Medicare HMO | Admitting: Interventional Cardiology

## 2016-12-09 VITALS — BP 110/70 | HR 100 | Ht 63.0 in | Wt 188.8 lb

## 2016-12-09 DIAGNOSIS — I481 Persistent atrial fibrillation: Secondary | ICD-10-CM | POA: Diagnosis not present

## 2016-12-09 DIAGNOSIS — Z7901 Long term (current) use of anticoagulants: Secondary | ICD-10-CM

## 2016-12-09 DIAGNOSIS — E782 Mixed hyperlipidemia: Secondary | ICD-10-CM

## 2016-12-09 DIAGNOSIS — I4819 Other persistent atrial fibrillation: Secondary | ICD-10-CM
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
## 2016-12-09 NOTE — Patient Instructions (Addendum)
Medication Instructions:  Your physician recommends that you continue on your current medications as directed. Please refer to the Current Medication list given to you today.   Labwork: None ordered  Testing/Procedures: None ordered  Follow-Up: Your physician wants you to follow-up in: 9 months with Dr. Irish Lack. You will receive a reminder letter in the mail two months in advance. If you don't receive a letter, please call our office to schedule the follow-up appointment.   Any Other Special Instructions Will Be Listed Below (If Applicable).     If you need a refill on your cardiac medications before your next appointment, please call your pharmacy.   Carbohydrate counting is a method for keeping track of how many carbohydrates you eat. Eating carbohydrates naturally increases the amount of sugar (glucose) in the blood. Counting how many carbohydrates you eat helps keep your blood glucose within normal limits. It is important to know how many carbohydrates you can safely have in each meal. This is different for every person. A diet and nutrition specialist (registered dietitian) can help you make a meal plan and calculate how many carbohydrates you should have at each meal and snack. Carbohydrates are found in the following foods:  Grains, such as breads and cereals.  Dried beans and soy products.  Starchy vegetables, such as potatoes, peas, and corn.  Fruit and fruit juices.  Milk and yogurt.  Sweets and snack foods, such as cake, cookies, candy, chips, and soft drinks.  How do I count carbohydrates? There are two ways to count carbohydrates in food. You can use either of the methods or a combination of both. Reading "Nutrition Facts" on packaged food The "Nutrition Facts" list is included on the labels of almost all packaged foods and beverages in the [place] It includes:  The serving size.  Information about nutrients in each serving, including the grams (g) of  carbohydrate per serving.  To use the "Nutrition Facts":  Decide how many servings you will have.  Multiply the number of servings by the number of carbohydrates per serving.  The resulting number is the total amount of carbohydrates that you will be having.  Learning standard serving sizes of other foods When you eat foods containing carbohydrates that are not packaged or do not include "Nutrition Facts" on the label, you need to measure the servings in order to count the amount of carbohydrates:  Measure the foods that you will eat with a food scale or measuring cup, if needed.  Decide how many standard-size servings you will eat.  Multiply the number of servings by 15. Most carbohydrate-rich foods have about 15 g of carbohydrates per serving. ? For example, if you eat 8 oz (170 g) of strawberries, you will have eaten 2 servings and 30 g of carbohydrates (2 servings x 15 g = 30 g).  For foods that have more than one food mixed, such as soups and casseroles, you must count the carbohydrates in each food that is included.  The following list contains standard serving sizes of common carbohydrate-rich foods. Each of these servings has about 15 g of carbohydrates:   hamburger bun or  English muffin.   oz (15 mL) syrup.   oz (14 g) jelly.  1 slice of bread.  1 six-inch tortilla.  3 oz (85 g) cooked rice or pasta.  4 oz (113 g) cooked dried beans.  4 oz (113 g) starchy vegetable, such as peas, corn, or potatoes.  4 oz (113 g) hot cereal.  4 oz (113 g) mashed potatoes or  of a large baked potato.  4 oz (113 g) canned or frozen fruit.  4 oz (120 mL) fruit juice.  4-6 crackers.  6 chicken nuggets.  6 oz (170 g) unsweetened dry cereal.  6 oz (170 g) plain fat-free yogurt or yogurt sweetened with artificial sweeteners.  8 oz (240 mL) milk.  8 oz (170 g) fresh fruit or one small piece of fruit.  24 oz (680 g) popped popcorn.  Example of carbohydrate  counting Sample meal  3 oz (85 g) chicken breast.  6 oz (170 g) brown rice.  4 oz (113 g) corn.  8 oz (240 mL) milk.  8 oz (170 g) strawberries with sugar-free whipped topping. Carbohydrate calculation 1. Identify the foods that contain carbohydrates: ? Rice. ? Corn. ? Milk. ? Strawberries. 2. Calculate how many servings you have of each food: ? 2 servings rice. ? 1 serving corn. ? 1 serving milk. ? 1 serving strawberries. 3. Multiply each number of servings by 15 g: ? 2 servings rice x 15 g = 30 g. ? 1 serving corn x 15 g = 15 g. ? 1 serving milk x 15 g = 15 g. ? 1 serving strawberries x 15 g = 15 g. 4. Add together all of the amounts to find the total grams of carbohydrates eaten: ? 30 g + 15 g + 15 g + 15 g = 75 g of carbohydrates total. This information is not intended to replace advice given to you by your health care provider. Make sure you discuss any questions you have with your health care provider. Document Released: 02/09/2005 Document Revised: 08/30/2015 Document Reviewed: 07/24/2015 Elsevier Interactive Patient Education  [employer].

## 2016-12-11 ENCOUNTER — Telehealth: Payer: Self-pay | Admitting: *Deleted

## 2016-12-11 DIAGNOSIS — Z7901 Long term (current) use of anticoagulants: Secondary | ICD-10-CM | POA: Insufficient documentation

## 2016-12-11 NOTE — Telephone Encounter (Signed)
Pt notified. She will stop taking ASA.

## 2016-12-11 NOTE — Telephone Encounter (Signed)
-----   Message from Jettie Booze, MD sent at 12/11/2016 10:12 AM EDT ----- [place], please make sure she is off of aspirin, since she takes Eliquis.

## 2016-12-11 NOTE — Telephone Encounter (Signed)
I placed call to pt and left message to call back.

## 2016-12-25 ENCOUNTER — Encounter: Payer: Self-pay | Admitting: Family Medicine

## 2016-12-25 ENCOUNTER — Ambulatory Visit (INDEPENDENT_AMBULATORY_CARE_PROVIDER_SITE_OTHER): Payer: Medicare HMO | Admitting: Family Medicine

## 2016-12-25 VITALS — BP 102/60 | HR 102 | Temp 98.3°F | Wt 185.0 lb

## 2016-12-25 DIAGNOSIS — N3001 Acute cystitis with hematuria: Secondary | ICD-10-CM

## 2016-12-25 LAB — POCT URINALYSIS DIP (MANUAL ENTRY)
BILIRUBIN UA: NEGATIVE
BILIRUBIN UA: NEGATIVE mg/dL
Glucose, UA: NEGATIVE mg/dL
NITRITE UA: NEGATIVE
Spec Grav, UA: 1.015 (ref 1.010–1.025)
Urobilinogen, UA: 0.2 E.U./dL
pH, UA: 7 (ref 5.0–8.0)

## 2016-12-25 LAB — POCT UA - MICROSCOPIC ONLY

## 2016-12-25 MED ORDER — CEPHALEXIN 500 MG PO CAPS: 500.0000 mg | ORAL_CAPSULE | Freq: Two times a day (BID) | ORAL | 0 refills | Status: AC

## 2016-12-25 NOTE — Patient Instructions (Addendum)
Take antibiotic keflex 500mg  twice a day for 7 days Stay well hydrated with water   Urinary Tract Infection, Adult A urinary tract infection (UTI) is an infection of any part of the urinary tract. The urinary tract includes the:  Kidneys.  Ureters.  Bladder.  Urethra.  These organs make, store, and get rid of pee (urine) in the body. Follow these instructions at home:  Take over-the-counter and prescription medicines only as told by your doctor.  If you were prescribed an antibiotic medicine, take it as told by your doctor. Do not stop taking the antibiotic even if you start to feel better.  Avoid the following drinks: ? Alcohol. ? Caffeine. ? Tea. ? Carbonated drinks.  Drink enough fluid to keep your pee clear or pale yellow.  Keep all follow-up visits as told by your doctor. This is important.  Make sure to: ? Empty your bladder often and completely. Do not to hold pee for long periods of time. ? Empty your bladder before and after sex. ? Wipe from front to back after a bowel movement if you are female. Use each tissue one time when you wipe. Contact a doctor if:  You have back pain.  You have a fever.  You feel sick to your stomach (nauseous).  You throw up (vomit).  Your symptoms do not get better after 3 days.  Your symptoms go away and then come back. Get help right away if:  You have very bad back pain.  You have very bad lower belly (abdominal) pain.  You are throwing up and cannot keep down any medicines or water. This information is not intended to replace advice given to you by your health care provider. Make sure you discuss any questions you have with your health care provider. Document Released: 07/29/2007 Document Revised: 07/18/2015 Document Reviewed: 12/31/2014 Elsevier Interactive Patient Education  [employer].

## 2016-12-25 NOTE — Assessment & Plan Note (Signed)
History and exam c/w UTI. No systemic symptoms or red flags to suggest pyelonephritis. Treat with keflex for 7 days. Discussed return precautions including fever/chills/rigors, inadequate response to ABX.

## 2016-12-25 NOTE — Progress Notes (Signed)
    Subjective:  Kristen Macias is a 83 y.o. female who presents to the [facility] today with a chief complaint of dysuria.   HPI:  Has had urinary frequency and urgency since last Friday Now having some dysuria and noticing a little bit of blood in urine Has some suprapubic abdominal pain and lower back pain associated with it that happens hafter urination No fever/chills, no n/v  ROS: Per HPI  Objective:  Physical Exam: BP 102/60   Pulse (!) 102   Temp 98.3 F (36.8 C) (Oral)   Wt 185 lb (83.9 kg)   SpO2 98%   BMI 32.77 kg/m   Gen: NAD, resting comfortably CV: RRR with no murmurs appreciated Pulm: NWOB, CTAB with no crackles, wheezes, or rhonchi GI: Normal bowel sounds present. Soft, Nontender, Nondistended.no CVA tenderness MSK: no edema, cyanosis, or clubbing noted Skin: warm, dry Neuro: grossly normal, moves all extremities Psych: Normal affect and thought content  Results for orders placed or performed in visit on 12/25/16 (from the past 72 hour(s))  POCT urinalysis dipstick     Status: Abnormal   Collection Time: 12/25/16 11:20 AM  Result Value Ref Range   Color, UA other (A) yellow   Clarity, UA cloudy (A) clear   Glucose, UA negative negative mg/dL   Bilirubin, UA negative negative   Ketones, POC UA negative negative mg/dL   Spec Grav, UA 1.015 1.010 - 1.025   Blood, UA large (A) negative   pH, UA 7.0 5.0 - 8.0   Protein Ur, POC =100 (A) negative mg/dL   Urobilinogen, UA 0.2 0.2 or 1.0 E.U./dL   Nitrite, UA Negative Negative   Leukocytes, UA Small (1+) (A) Negative     Assessment/Plan:  Acute cystitis with hematuria History and exam c/w UTI. No systemic symptoms or red flags to suggest pyelonephritis. Treat with keflex for 7 days. Discussed return precautions including fever/chills/rigors, inadequate response to ABX.   Bufford Lope, DO PGY-2, [facility] 12/25/2016 11:25 AM

## 2016-12-25 NOTE — Addendum Note (Signed)
Addended by: [place] Pink on: 12/25/2016 12:01 PM   Modules accepted: Orders

## 2017-01-04 ENCOUNTER — Other Ambulatory Visit: Payer: Self-pay | Admitting: *Deleted

## 2017-01-04 ENCOUNTER — Other Ambulatory Visit: Payer: Self-pay | Admitting: Internal Medicine

## 2017-01-04 DIAGNOSIS — J449 Chronic obstructive pulmonary disease, unspecified: Secondary | ICD-10-CM

## 2017-01-04 DIAGNOSIS — I482 Chronic atrial fibrillation, unspecified: Secondary | ICD-10-CM

## 2017-01-04 NOTE — Telephone Encounter (Signed)
Message left on clinic nurse voice mail - Patient requesting refill of Abx for UTI.  Saw Dr. Shawna Orleans on 12/29/16 and has completed course of med.  States she is still having some  "bleeding, back is hurting"; denies fever or dysuria.  Will route request to PCP and Dr. Shawna Orleans for advice, refill of med, or need for OV for reassessment.  Burna Forts, BSN, RN-BC

## 2017-01-05 NOTE — Telephone Encounter (Signed)
Patient needs to come in for another urinalysis and urine culture. If the antibiotics didn't help her symptoms, then it is possible that her UTI is caused by a bacteria that is resistant to the antibiotic that was prescribed. We need to do further testing so that we can make sure that we are prescribing the right medication. Another course of the Keflex will likely not help. Thanks!

## 2017-01-05 NOTE — Telephone Encounter (Signed)
Pt contacted and offered same day apt for today, pt declined and stated she needed three days notice for transportation. Pt scheduled for Friday.

## 2017-01-08 ENCOUNTER — Ambulatory Visit: Payer: Medicare HMO | Admitting: Family Medicine

## 2017-01-17 ENCOUNTER — Other Ambulatory Visit: Payer: Self-pay | Admitting: Internal Medicine

## 2017-01-19 ENCOUNTER — Other Ambulatory Visit: Payer: Self-pay | Admitting: Internal Medicine

## 2017-03-03 ENCOUNTER — Other Ambulatory Visit: Payer: Self-pay | Admitting: Internal Medicine

## 2017-03-15 ENCOUNTER — Encounter: Payer: Self-pay | Admitting: Family Medicine

## 2017-03-15 ENCOUNTER — Inpatient Hospital Stay (HOSPITAL_COMMUNITY)
Admission: AD | Admit: 2017-03-15 | Discharge: 2017-03-17 | DRG: 688 | Disposition: A | Discharge status: Home / Self Care | Payer: Medicare HMO | Source: Ambulatory Visit | Attending: Family Medicine | Admitting: Family Medicine

## 2017-03-15 ENCOUNTER — Ambulatory Visit (HOSPITAL_COMMUNITY)
Admission: RE | Admit: 2017-03-15 | Discharge: 2017-03-15 | Disposition: A | Discharge status: Home / Self Care | Payer: Medicare HMO | Source: Ambulatory Visit | Attending: Family Medicine | Admitting: Family Medicine

## 2017-03-15 ENCOUNTER — Ambulatory Visit: Payer: Medicare HMO | Admitting: Internal Medicine

## 2017-03-15 ENCOUNTER — Encounter (HOSPITAL_COMMUNITY): Payer: Self-pay | Admitting: Radiology

## 2017-03-15 ENCOUNTER — Observation Stay (HOSPITAL_COMMUNITY): Payer: Medicare HMO

## 2017-03-15 ENCOUNTER — Other Ambulatory Visit: Payer: Self-pay

## 2017-03-15 ENCOUNTER — Ambulatory Visit (INDEPENDENT_AMBULATORY_CARE_PROVIDER_SITE_OTHER): Payer: Medicare HMO | Admitting: Family Medicine

## 2017-03-15 VITALS — BP 92/58 | HR 112 | Temp 97.6°F | Ht 63.0 in | Wt 186.4 lb

## 2017-03-15 DIAGNOSIS — I700 Atherosclerosis of aorta: Secondary | ICD-10-CM

## 2017-03-15 DIAGNOSIS — J449 Chronic obstructive pulmonary disease, unspecified: Secondary | ICD-10-CM | POA: Diagnosis present

## 2017-03-15 DIAGNOSIS — N3289 Other specified disorders of bladder: Secondary | ICD-10-CM | POA: Diagnosis not present

## 2017-03-15 DIAGNOSIS — M810 Age-related osteoporosis without current pathological fracture: Secondary | ICD-10-CM | POA: Diagnosis present

## 2017-03-15 DIAGNOSIS — Z66 Do not resuscitate: Secondary | ICD-10-CM | POA: Diagnosis present

## 2017-03-15 DIAGNOSIS — D649 Anemia, unspecified: Secondary | ICD-10-CM | POA: Diagnosis present

## 2017-03-15 DIAGNOSIS — D509 Iron deficiency anemia, unspecified: Secondary | ICD-10-CM | POA: Diagnosis present

## 2017-03-15 DIAGNOSIS — I482 Chronic atrial fibrillation: Secondary | ICD-10-CM | POA: Diagnosis present

## 2017-03-15 DIAGNOSIS — K219 Gastro-esophageal reflux disease without esophagitis: Secondary | ICD-10-CM | POA: Diagnosis present

## 2017-03-15 DIAGNOSIS — R000 Tachycardia, unspecified: Secondary | ICD-10-CM | POA: Diagnosis not present

## 2017-03-15 DIAGNOSIS — R310 Gross hematuria: Secondary | ICD-10-CM | POA: Diagnosis present

## 2017-03-15 DIAGNOSIS — Z7901 Long term (current) use of anticoagulants: Secondary | ICD-10-CM

## 2017-03-15 DIAGNOSIS — Z87891 Personal history of nicotine dependence: Secondary | ICD-10-CM | POA: Diagnosis not present

## 2017-03-15 DIAGNOSIS — N329 Bladder disorder, unspecified: Secondary | ICD-10-CM | POA: Diagnosis present

## 2017-03-15 DIAGNOSIS — K5790 Diverticulosis of intestine, part unspecified, without perforation or abscess without bleeding: Secondary | ICD-10-CM

## 2017-03-15 DIAGNOSIS — Z79899 Other long term (current) drug therapy: Secondary | ICD-10-CM

## 2017-03-15 DIAGNOSIS — R109 Unspecified abdominal pain: Secondary | ICD-10-CM | POA: Diagnosis not present

## 2017-03-15 DIAGNOSIS — E785 Hyperlipidemia, unspecified: Secondary | ICD-10-CM | POA: Diagnosis present

## 2017-03-15 DIAGNOSIS — C679 Malignant neoplasm of bladder, unspecified: Principal | ICD-10-CM

## 2017-03-15 HISTORY — DX: Chronic obstructive pulmonary disease, unspecified: J44.9

## 2017-03-15 HISTORY — DX: Atherosclerosis of aorta: I70.0

## 2017-03-15 HISTORY — DX: Diverticulosis of intestine, part unspecified, without perforation or abscess without bleeding: K57.90

## 2017-03-15 LAB — IRON AND TIBC
Iron: 12 ug/dL — ABNORMAL LOW (ref 28–170)
Saturation Ratios: 2 % — ABNORMAL LOW (ref 10.4–31.8)
TIBC: 557 ug/dL — ABNORMAL HIGH (ref 250–450)
UIBC: 545 ug/dL

## 2017-03-15 LAB — VITAMIN B12: Vitamin B-12: 355 pg/mL (ref 180–914)

## 2017-03-15 LAB — CBC WITH DIFFERENTIAL/PLATELET
Basophils Absolute: 0.1 10*3/uL (ref 0.0–0.1)
Basophils Relative: 1 %
EOS PCT: 1 %
Eosinophils Absolute: 0.1 10*3/uL (ref 0.0–0.7)
HEMATOCRIT: 20.8 % — AB (ref 36.0–46.0)
Hemoglobin: 5.7 g/dL — CL (ref 12.0–15.0)
Lymphocytes Relative: 16 %
Lymphs Abs: 1.5 10*3/uL (ref 0.7–4.0)
MCH: 17.2 pg — ABNORMAL LOW (ref 26.0–34.0)
MCHC: 27.4 g/dL — ABNORMAL LOW (ref 30.0–36.0)
MCV: 62.7 fL — ABNORMAL LOW (ref 78.0–100.0)
Monocytes Absolute: 0.5 10*3/uL (ref 0.1–1.0)
Monocytes Relative: 5 %
NEUTROS PCT: 77 %
Neutro Abs: 7.1 10*3/uL (ref 1.7–7.7)
PLATELETS: 404 10*3/uL — AB (ref 150–400)
RBC: 3.32 MIL/uL — AB (ref 3.87–5.11)
RDW: 19.9 % — ABNORMAL HIGH (ref 11.5–15.5)
WBC: 9.3 10*3/uL (ref 4.0–10.5)

## 2017-03-15 LAB — PROTIME-INR
INR: 1.31
PROTHROMBIN TIME: 16.2 s — AB (ref 11.4–15.2)

## 2017-03-15 LAB — COMPREHENSIVE METABOLIC PANEL
ALBUMIN: 3.4 g/dL — AB (ref 3.5–5.0)
ALT: 13 U/L — ABNORMAL LOW (ref 14–54)
AST: 20 U/L (ref 15–41)
Alkaline Phosphatase: 111 U/L (ref 38–126)
Anion gap: 12 (ref 5–15)
BILIRUBIN TOTAL: 0.7 mg/dL (ref 0.3–1.2)
BUN: 16 mg/dL (ref 6–20)
CHLORIDE: 106 mmol/L (ref 101–111)
CO2: 20 mmol/L — AB (ref 22–32)
Calcium: 9 mg/dL (ref 8.9–10.3)
Creatinine, Ser: 0.93 mg/dL (ref 0.44–1.00)
GFR calc Af Amer: >60 mL/min (ref >60)
GFR calc non Af Amer: 56 mL/min — ABNORMAL LOW (ref >60)
GLUCOSE: 122 mg/dL — AB (ref 65–99)
POTASSIUM: 4.3 mmol/L (ref 3.5–5.1)
SODIUM: 138 mmol/L (ref 135–145)
Total Protein: 6.8 g/dL (ref 6.5–8.1)

## 2017-03-15 LAB — RETICULOCYTES
RBC.: 3.16 MIL/uL — AB (ref 3.87–5.11)
RETIC COUNT ABSOLUTE: 72.7 10*3/uL (ref 19.0–186.0)
Retic Ct Pct: 2.3 % (ref 0.4–3.1)

## 2017-03-15 LAB — APTT: APTT: 34 s (ref 24–36)

## 2017-03-15 LAB — FOLATE: FOLATE: 19.9 ng/mL (ref >5.9)

## 2017-03-15 LAB — POCT HEMOGLOBIN: Hemoglobin: 5.4 g/dL — AB (ref 12.2–16.2)

## 2017-03-15 LAB — PREPARE RBC (CROSSMATCH)

## 2017-03-15 LAB — FERRITIN: Ferritin: 2 ng/mL — ABNORMAL LOW (ref 11–307)

## 2017-03-15 MED ORDER — ACETAMINOPHEN 500 MG PO TABS
500.0000 mg | ORAL_TABLET | ORAL | Status: DC | PRN
  Administered 2017-03-15 – 2017-03-17 (×3): 500 mg via ORAL
  Filled 2017-03-15 (×3): qty 1

## 2017-03-15 MED ORDER — POLYETHYLENE GLYCOL 3350 17 G PO PACK
17.0000 g | PACK | Freq: Every day | ORAL | Status: DC
  Administered 2017-03-15 – 2017-03-17 (×3): 17 g via ORAL
  Filled 2017-03-15 (×3): qty 1

## 2017-03-15 MED ORDER — CALCIUM CARBONATE-VITAMIN D 500-200 MG-UNIT PO TABS
1.0000 | ORAL_TABLET | Freq: Every day | ORAL | Status: DC
  Administered 2017-03-16 – 2017-03-17 (×2): 1 via ORAL
  Filled 2017-03-15 (×4): qty 1

## 2017-03-15 MED ORDER — METOPROLOL TARTRATE 50 MG PO TABS
50.0000 mg | ORAL_TABLET | Freq: Every day | ORAL | Status: DC
  Filled 2017-03-15: qty 1

## 2017-03-15 MED ORDER — SODIUM CHLORIDE 0.9 % IV SOLN: INTRAVENOUS | Status: DC

## 2017-03-15 MED ORDER — ATORVASTATIN CALCIUM 40 MG PO TABS
40.0000 mg | ORAL_TABLET | Freq: Every day | ORAL | Status: DC
  Administered 2017-03-15 – 2017-03-17 (×3): 40 mg via ORAL
  Filled 2017-03-15 (×3): qty 1

## 2017-03-15 MED ORDER — METOPROLOL TARTRATE 25 MG PO TABS
25.0000 mg | ORAL_TABLET | Freq: Two times a day (BID) | ORAL | Status: DC
  Administered 2017-03-16 – 2017-03-17 (×4): 25 mg via ORAL
  Filled 2017-03-15 (×4): qty 1

## 2017-03-15 MED ORDER — ALBUTEROL SULFATE (2.5 MG/3ML) 0.083% IN NEBU: 3.0000 mL | INHALATION_SOLUTION | Freq: Four times a day (QID) | RESPIRATORY_TRACT | Status: DC | PRN

## 2017-03-15 MED ORDER — SODIUM CHLORIDE 0.9 % IV SOLN
Freq: Once | INTRAVENOUS | Status: AC
  Administered 2017-03-15: 19:00:00 via INTRAVENOUS

## 2017-03-15 MED ORDER — PANTOPRAZOLE SODIUM 40 MG PO TBEC
40.0000 mg | DELAYED_RELEASE_TABLET | Freq: Every day | ORAL | Status: DC
  Administered 2017-03-15 – 2017-03-17 (×3): 40 mg via ORAL
  Filled 2017-03-15 (×3): qty 1

## 2017-03-15 MED ORDER — IOPAMIDOL (ISOVUE-300) INJECTION 61%
100.0000 mL | Freq: Once | INTRAVENOUS | Status: AC | PRN
  Administered 2017-03-15: 100 mL via INTRAVENOUS

## 2017-03-15 MED ORDER — FERROUS SULFATE 325 (65 FE) MG PO TABS
325.0000 mg | ORAL_TABLET | Freq: Two times a day (BID) | ORAL | Status: DC
  Administered 2017-03-15 – 2017-03-17 (×4): 325 mg via ORAL
  Filled 2017-03-15 (×4): qty 1

## 2017-03-15 MED ORDER — TIOTROPIUM BROMIDE MONOHYDRATE 18 MCG IN CAPS
18.0000 ug | ORAL_CAPSULE | Freq: Every day | RESPIRATORY_TRACT | Status: DC
  Administered 2017-03-16 – 2017-03-17 (×2): 18 ug via RESPIRATORY_TRACT
  Filled 2017-03-15: qty 5

## 2017-03-15 MED ORDER — FAMOTIDINE 20 MG PO TABS
10.0000 mg | ORAL_TABLET | Freq: Every day | ORAL | Status: DC | PRN
  Administered 2017-03-16 – 2017-03-17 (×2): 10 mg via ORAL
  Filled 2017-03-15 (×2): qty 1

## 2017-03-15 NOTE — Progress Notes (Signed)
ekg  Subjective:    Patient ID: Kristen Macias , female   DOB: January 27, 1934 , 84 y.o..   MRN: [id number]  HPI  Kristen Macias is a 84 yo F with PMH of COPD, A fibrillation, anemia, prediabetes, here for  Chief Complaint  Patient presents with  . Shortness of Breath  . Tachycardia    1. Heart racing and dyspnea: Patient notes that for about 1 month she has been feeling lightheaded, heart racing, and short of breath with ambulation. She notes that she was walking 1 month ago at [employer] and felt like she was going to pass out.  It's happened a total of 4 times, the last time was the 13th of January. When these episodes happened she felt nauseas. One time she threw up. The last time she was walking around grocery shopping. She denies any syncope or blacking out. She notes that she has to sit down until she feels better. Admits to some vertigo. Denies any chest pain . No heart racing before lightheadedness.   Admits to some blood in her urine 2 months ago in her urine. No darker than normal stools or hematochezia. She has been taking Miralax. She had a colonoscopy "years ago" and it was normal. Admits to keeping well hydrated. Good PO intake.   She is taking Eliquis daily for a fib. Not taking iron pills. Her last hemoglobin was 8.5 in June 2018.   Review of Systems: Per HPI.    Past Medical History: Patient Active Problem List   Diagnosis Date Noted  . Acute cystitis with hematuria 12/25/2016  . Anticoagulated 12/11/2016  . Lower extremity edema 09/10/2016  . Atrial fibrillation ([place]) 05/19/2016  . Osteoporosis 03/08/2015  . Compression fracture of T7 thoracic vertebra 03/07/2015  . Hyperlipidemia 08/14/2014  . Cataracts, bilateral 08/02/2014  . Prediabetes 05/27/2013  . Anemia 05/27/2013  . DVT (deep venous thrombosis) ([place]) 05/18/2013  . Dysphagia 01/15/2012  . History of tobacco use 08/03/2011  . GERD (gastroesophageal reflux disease) 08/03/2011  . Preventative health care  08/03/2011  . COPD exacerbation ([place]) 08/03/2011    Medications: reviewed and updated No current outpatient medications on file.   No current facility-administered medications for this visit.     Social Hx:  reports that she quit smoking about 6 years ago. Her smoking use included cigarettes. She started smoking about 65 years ago. She smoked 0.30 packs per day. she has never used smokeless tobacco.   Objective:   BP (!) 92/58   Pulse (!) 112   Temp 97.6 F (36.4 C) (Oral)   Ht 5\' 3"  (1.6 m)   Wt 186 lb 6.4 oz (84.6 kg)   SpO2 99%   BMI 33.02 kg/m  Physical Exam  Gen: NAD, alert, cooperative with exam, pale appearing  HEENT: NCAT, PERRL, clear conjunctiva, oropharynx clear, supple neck Cardiac: Tachycardic rate and regular rhythm, normal S1/S2, capillary refill slow Respiratory: Clear to auscultation bilaterally, no wheezes, non-labored breathing Gastrointestinal: soft, non tender, non distended, bowel sounds present Skin: no rashes, normal turgor  Neurological: no gross deficits.  Psych: good insight, normal mood and affect  Results for orders placed or performed in visit on 03/15/17  POCT hemoglobin  Result Value Ref Range   Hemoglobin 5.4 (A) 12.2 - 16.2 g/dL   EKG: Atrial fibrillation, no obvious ST changes  Assessment & Plan:  Anemia Pleasant 84 year old female with history of anemia and A. fib on chronic anticoagulation coming in for 1 month of "heart racing",  shortness of breath, and lightheadedness with ambulation.  On exam she is pale appearing.  She is tachycardic to 112 and blood pressure 92/58.  Her oxygen is 99%.  EKG showing atrial fibrillation. POC hemoglobin checked and was very low at 5.4.  Attempted to do FOBT but patient declined rectal exam.  Her symptoms are likely due to symptomatic anemia vs symptomatic atrial fibrillation.  She has not been taking her iron pills. Concerned that she is on Eliquis daily, ?GI bleeding although no obvious dark tarry  stools or hematochezia.  Discussed the need for admission with patient for blood transfusion and workup, she was at first hesitant but then became agreeable.  Direct admission orders placed. Signed out to Dr. Mingo Amber and inpatient team.  Orders Placed This Encounter  Procedures  . POCT hemoglobin  . EKG 12-Lead    Smitty Cords, MD [place], PGY-3
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
## 2017-03-15 NOTE — Assessment & Plan Note (Addendum)
Pleasant 84 year old female with history of anemia and A. fib on chronic anticoagulation coming in for 1 month of "heart racing", shortness of breath, and lightheadedness with ambulation.  On exam she is pale appearing.  She is tachycardic to 112 and blood pressure 92/58.  Her oxygen is 99%.  EKG showing atrial fibrillation. POC hemoglobin checked and was very low at 5.4.  Attempted to do FOBT but patient declined rectal exam.  Her symptoms are likely due to symptomatic anemia vs symptomatic atrial fibrillation.  She has not been taking her iron pills. Concerned that she is on Eliquis daily, ?GI bleeding although no obvious dark tarry stools or hematochezia.  Discussed the need for admission with patient for blood transfusion and workup, she was at first hesitant but then became agreeable.  Direct admission orders placed. Signed out to Dr. Mingo Amber and inpatient team.

## 2017-03-15 NOTE — Patient Instructions (Signed)
Thank you for coming in today, it was so nice to see you! Today we talked about:    You have very severe anemia, you will need to go to the hospital for a blood transfusion and to make sure your heart is ok. There are no available beds in the hospital right now so someone will call you when they have an available bed. Please make sure you stand by your phone.   Please call [phone] if   Sincerely,  Smitty Cords, MD

## 2017-03-16 ENCOUNTER — Encounter (HOSPITAL_COMMUNITY): Payer: Self-pay | Admitting: *Deleted

## 2017-03-16 ENCOUNTER — Telehealth: Payer: Self-pay | Admitting: Licensed Clinical Social Worker

## 2017-03-16 ENCOUNTER — Other Ambulatory Visit: Payer: Self-pay

## 2017-03-16 DIAGNOSIS — D649 Anemia, unspecified: Secondary | ICD-10-CM

## 2017-03-16 DIAGNOSIS — C679 Malignant neoplasm of bladder, unspecified: Secondary | ICD-10-CM | POA: Diagnosis not present

## 2017-03-16 DIAGNOSIS — Z66 Do not resuscitate: Secondary | ICD-10-CM | POA: Diagnosis not present

## 2017-03-16 DIAGNOSIS — N3289 Other specified disorders of bladder: Secondary | ICD-10-CM

## 2017-03-16 DIAGNOSIS — J449 Chronic obstructive pulmonary disease, unspecified: Secondary | ICD-10-CM | POA: Diagnosis not present

## 2017-03-16 DIAGNOSIS — E785 Hyperlipidemia, unspecified: Secondary | ICD-10-CM | POA: Diagnosis present

## 2017-03-16 DIAGNOSIS — Z79899 Other long term (current) drug therapy: Secondary | ICD-10-CM | POA: Diagnosis not present

## 2017-03-16 DIAGNOSIS — Z7901 Long term (current) use of anticoagulants: Secondary | ICD-10-CM | POA: Diagnosis not present

## 2017-03-16 DIAGNOSIS — R310 Gross hematuria: Secondary | ICD-10-CM

## 2017-03-16 DIAGNOSIS — Z9289 Personal history of other medical treatment: Secondary | ICD-10-CM

## 2017-03-16 DIAGNOSIS — Z87891 Personal history of nicotine dependence: Secondary | ICD-10-CM | POA: Diagnosis not present

## 2017-03-16 DIAGNOSIS — K219 Gastro-esophageal reflux disease without esophagitis: Secondary | ICD-10-CM | POA: Diagnosis not present

## 2017-03-16 DIAGNOSIS — M810 Age-related osteoporosis without current pathological fracture: Secondary | ICD-10-CM | POA: Diagnosis present

## 2017-03-16 DIAGNOSIS — I482 Chronic atrial fibrillation: Secondary | ICD-10-CM | POA: Diagnosis not present

## 2017-03-16 DIAGNOSIS — N329 Bladder disorder, unspecified: Secondary | ICD-10-CM | POA: Diagnosis not present

## 2017-03-16 DIAGNOSIS — D509 Iron deficiency anemia, unspecified: Secondary | ICD-10-CM | POA: Diagnosis not present

## 2017-03-16 HISTORY — DX: Personal history of other medical treatment: Z92.89

## 2017-03-16 LAB — URINALYSIS, COMPLETE (UACMP) WITH MICROSCOPIC
Bilirubin Urine: NEGATIVE
GLUCOSE, UA: NEGATIVE mg/dL
Ketones, ur: NEGATIVE mg/dL
Leukocytes, UA: NEGATIVE
NITRITE: NEGATIVE
PH: 5 (ref 5.0–8.0)
Protein, ur: NEGATIVE mg/dL
SPECIFIC GRAVITY, URINE: 1.043 — AB (ref 1.005–1.030)

## 2017-03-16 LAB — TYPE AND SCREEN
ABO/RH(D): A NEG
Antibody Screen: POSITIVE
DONOR AG TYPE: NEGATIVE
Donor AG Type: NEGATIVE
PT AG TYPE: NEGATIVE
UNIT DIVISION: 0
UNIT DIVISION: 0

## 2017-03-16 LAB — BPAM RBC
Blood Product Expiration Date: 201901302359
Blood Product Expiration Date: 201901302359
ISSUE DATE / TIME: 201901211816
ISSUE DATE / TIME: 201901212358
UNIT TYPE AND RH: 600
Unit Type and Rh: 600

## 2017-03-16 LAB — CBC
HCT: 24 % — ABNORMAL LOW (ref 36.0–46.0)
Hemoglobin: 7.1 g/dL — ABNORMAL LOW (ref 12.0–15.0)
MCH: 19.8 pg — AB (ref 26.0–34.0)
MCHC: 29.6 g/dL — ABNORMAL LOW (ref 30.0–36.0)
MCV: 67 fL — AB (ref 78.0–100.0)
PLATELETS: 322 10*3/uL (ref 150–400)
RBC: 3.58 MIL/uL — ABNORMAL LOW (ref 3.87–5.11)
RDW: 22.7 % — ABNORMAL HIGH (ref 11.5–15.5)
WBC: 8.7 10*3/uL (ref 4.0–10.5)

## 2017-03-16 LAB — BASIC METABOLIC PANEL
Anion gap: 13 (ref 5–15)
BUN: 11 mg/dL (ref 6–20)
CALCIUM: 8.5 mg/dL — AB (ref 8.9–10.3)
CHLORIDE: 106 mmol/L (ref 101–111)
CO2: 19 mmol/L — AB (ref 22–32)
CREATININE: 0.92 mg/dL (ref 0.44–1.00)
GFR calc Af Amer: >60 mL/min (ref >60)
GFR calc non Af Amer: 57 mL/min — ABNORMAL LOW (ref >60)
Glucose, Bld: 94 mg/dL (ref 65–99)
Potassium: 4 mmol/L (ref 3.5–5.1)
SODIUM: 138 mmol/L (ref 135–145)

## 2017-03-16 LAB — GLUCOSE, CAPILLARY: GLUCOSE-CAPILLARY: 108 mg/dL — AB (ref 65–99)

## 2017-03-16 MED ORDER — APIXABAN 5 MG PO TABS
5.0000 mg | ORAL_TABLET | Freq: Two times a day (BID) | ORAL | Status: DC
  Administered 2017-03-16 – 2017-03-17 (×2): 5 mg via ORAL
  Filled 2017-03-16 (×2): qty 1
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
## 2017-03-16 NOTE — Progress Notes (Signed)
   03/16/17 1000  Clinical Encounter Type  Visited With Patient and family together  Visit Type Initial  Referral From Nurse  Consult/Referral To Chaplain  Spiritual Encounters  Spiritual Needs Brochure  Stress Factors  Patient Stress Factors Health changes  Family Stress Factors Exhausted   Patient was in her bed awake and alert this morning when I visited. Family made up of one son and wife on-site. Patient said she did not want to discuss a bout power of Attorney matters at the time. She requested to be given time and will call through her charge nurse whenever she is ready to discuss .  Rozell Kettlewell a Medical sales representative, [employer]

## 2017-03-16 NOTE — Progress Notes (Signed)
Family Medicine Teaching Service Daily Progress Note Intern Pager: [phone]  Patient name: Kristen Macias Medical record number: [id number] Date of birth: Jun 24, 1933 Age: 84 y.o. Gender: female  Primary Care Provider: Mayo, Pete Pelt, MD Consultants: GI, urology Code Status: DNR  Pt Overview and Major Events to Date:  1/21 Admitted  Assessment and Plan:  Kristen Macias is a 84 y.o. female presenting with symptomatic anemia. PMH is significant for COPD, atrial fibrillation on Eliquis, HLD, and chronic anemia.    Symptomatic anemia  Chronic anemia  Bladder mass: Acute on chronic. Patient c/o fatigue, lightheadedness, dyspnea, heart palpitations found to have Hgb 5.4.  Baseline appears to be 10.5 several years ago with a drop most notably over last 2 years. Patient also reports episode of gross hematuria about two months ago which has since resolved.  She endorsing mild abdominal tenderness on exam.  Atrial fibrillation noted on EKG, with pulse rate of 89 bmp.  Patient declined rectal exam for FOBT in the office.  Patient has iron tablets on med list though she doe not endorse taking them.  Last Fe study in 2018 with ferritin of 4, iron of 20, and elevated folate, likely representing iron deficiency anemia. CT abdomen/pelvis significant for bladder mass, likely neoplasm, so this is the likely source of her anemia, although concurrent GI source is still possible.   Hgb after transfusions 7.1. - repeat FOBT if patient allows - repeat CBC on 1/23 - urine culture per urology recs - will use NQIS ACS risk calculator to assess patient's surgical candidacy - consider when to restart Eliquis and how long patient should be off of this prior to transurethral resection of tumor - restart home ferrous sulfate 325 mg BIDWC  COPD: Stable. Patient with dyspnea during ambulation present before acute episode but has not been hospitalized for an exacerbation, so likely Gold criteria B.  Takes albuterol and  spiriva PRN at home. Does not appear to have signs of exacerbation of hypoxia. - continue home medications - will need to educate patient on daily Spiriva use or discontinue Spiriva  Atrial fibrillation: Chronic. Rate controlled. Patient with history of atrial fibrillation also present today.  Has been taking Eliquis 5 mg BID and Lopressor 25 mg BID for this.  Atrial fibrillation could also be contributing to patient's weakness and palpitations. - continue home Lopressor - telemetry - will need to assess plan for anticoagulation in the setting of severe anemia prior to discharge  HLD: Chronic. Last lipid panel: total cholesterol 208, TG 224, LEL 118, HDL 46.  Takes Lipitor 40 mg daily. - continue Lipitor  FEN/GI: NPO due to abdominal pain, will advance once CT results Prophylaxis: SCDs  Disposition: likely home for further urological outpatient workup  Subjective:  Patient has no complaints and asked appropriate questions in response to results of CT scan.  She expressed understanding and is looking forward to going home.  Objective: Temp:  [97.5 F (36.4 C)-99.1 F (37.3 C)] 99.1 F (37.3 C) (01/22 0305) Pulse Rate:  [88-112] 95 (01/22 0305) Resp:  [13-21] 20 (01/22 0305) BP: (92-130)/(58-79) 109/70 (01/22 0305) SpO2:  [93 %-99 %] 93 % (01/22 0305) Weight:  [172 lb 9.6 oz (78.3 kg)-186 lb 6.4 oz (84.6 kg)] 182 lb 3.2 oz (82.6 kg) (01/22 7564) Physical Exam  Constitutional: She is oriented to person, place, and time. She appears well-developed and well-nourished.  Patient appears pale  HENT:  Head: Normocephalic and atraumatic.  Eyes: EOM are normal. No scleral icterus.  Neck: Normal range of motion.  Cardiovascular: Normal rate.  Irregular rhythm  Pulmonary/Chest: Effort normal and breath sounds normal.  Abdominal: Soft. Bowel sounds are normal.  Musculoskeletal: Normal range of motion.  Neurological: She is alert and oriented to person, place, and time.  Skin: Skin is  warm and dry.  Psychiatric: She has a normal mood and affect. Her behavior is normal. Judgment and thought content normal.     Laboratory: Recent Labs  Lab 03/15/17 1150 03/15/17 1400 03/16/17 0417  WBC  --  9.3 8.7  HGB 5.4* 5.7* 7.1*  HCT  --  20.8* 24.0*  PLT  --  404* 322   Recent Labs  Lab 03/15/17 1400 03/16/17 0417  NA 138 138  K 4.3 4.0  CL 106 106  CO2 20* 19*  BUN 16 11  CREATININE 0.93 0.92  CALCIUM 9.0 8.5*  PROT 6.8  --   BILITOT 0.7  --   ALKPHOS 111  --   ALT 13*  --   AST 20  --   GLUCOSE 122* 94    Imaging/Diagnostic Tests: Ct Abdomen Pelvis W Contrast  Result Date: 03/15/2017 CLINICAL DATA:  Abdominal pain, weakness, lack of energy, anemia, history GERD, atrial fibrillation EXAM: CT ABDOMEN AND PELVIS WITH CONTRAST TECHNIQUE: Multidetector CT imaging of the abdomen and pelvis was performed using the standard protocol following bolus administration of intravenous contrast. Sagittal and coronal MPR images reconstructed from axial data set. CONTRAST:  172mL ISOVUE-300 IOPAMIDOL (ISOVUE-300) INJECTION 61% IV. No oral contrast administered. COMPARISON:  None FINDINGS: Lower chest: Minimal pleural effusions bilaterally greater on RIGHT. Hepatobiliary: Dependent calculi in gallbladder. No gross gallbladder wall thickening or pericholecystic infiltration. Liver unremarkable. No biliary dilatation. Pancreas: Normal appearance Spleen: Normal appearance Adrenals/Urinary Tract: Adrenal glands, kidneys, and ureters normal appearance. Enhancing soft tissue mass at the anterior LEFT aspect of the urinary bladder, 2.6 x 2.1 x 1.8 cm highly suspicious for a bladder neoplasm. Remainder of bladder unremarkable. Stomach/Bowel: Appendix surgically absent by history. Extensive diverticulosis of descending and sigmoid colon without definite colonic wall thickening or pericolic infiltrative changes to suggest acute diverticulitis. Stomach decompressed with moderate-sized hiatal hernia  noted. Small bowel loops and remainder of colon normal appearance. Vascular/Lymphatic: Atherosclerotic calcifications aorta and iliac arteries. No adenopathy. Reproductive: Uterus surgically absent. Nonvisualization of ovaries. Other: No free air or free fluid. No definite inflammatory process. Tiny umbilical hernia containing fat. Musculoskeletal: Mild scattered degenerative changes of the lumbar spine. Chronic appearing superior endplate deformity of L1 vertebral body noted. No acute osseous findings. IMPRESSION: Distal colonic diverticulosis without evidence of diverticulitis. 2.6 x 2.1 x 1.8 cm diameter LEFT anterior bladder mass highly suspicious for a bladder neoplasm; further assessment by cystoscopy recommended. Cholelithiasis. Hiatal hernia. Tiny pleural effusions greater on RIGHT. Aortic Atherosclerosis (ICD10-I70.0). Electronically Signed   By: Lavonia Dana M.D.   On: 03/15/2017 16:22     Kathrene Alu, MD 03/16/2017, 7:37 AM PGY-1, [place] Intern pager: [phone], text pages welcome

## 2017-03-16 NOTE — Progress Notes (Signed)
Type of Service: Clinical Social Work  Education officer, museum received voice message from [facility] front office staff, reference  Patient in office on Monday for appointment with PCP and reported she has no heat in her home.  States only heat is a small space heater which is not heating her home.   LCSW unable to assess needs at patient was admitted to the hospital.   Plan: LCSW contacted Manuela Schwartz, Social Worker on [place] as an Pharmacist, hospital as well as provider [employer] via in-basket.   Casimer Lanius, LCSW Licensed Clinical Social Worker [place]   [phone] 12:14 PM

## 2017-03-17 LAB — CBC
HCT: 27.9 % — ABNORMAL LOW (ref 36.0–46.0)
Hemoglobin: 7.9 g/dL — ABNORMAL LOW (ref 12.0–15.0)
MCH: 19.3 pg — ABNORMAL LOW (ref 26.0–34.0)
MCHC: 28.3 g/dL — ABNORMAL LOW (ref 30.0–36.0)
MCV: 68.2 fL — AB (ref 78.0–100.0)
Platelets: 364 10*3/uL (ref 150–400)
RBC: 4.09 MIL/uL (ref 3.87–5.11)
RDW: 23.4 % — AB (ref 11.5–15.5)
WBC: 9.5 10*3/uL (ref 4.0–10.5)

## 2017-03-17 LAB — URINE CULTURE
Culture: <10000 — AB
Special Requests: NORMAL

## 2017-03-17 LAB — OCCULT BLOOD X 1 CARD TO LAB, STOOL: Fecal Occult Bld: NEGATIVE

## 2017-03-17 NOTE — Clinical Social Work Note (Signed)
Clinical Social Work Assessment  Patient Details  Name: Kristen Macias MRN: [id number] Date of Birth: 12/10/1933  Date of referral:  03/17/17               Reason for consult:  [employer]                Permission sought to share information with:  Other Permission granted to share information::  Yes, Verbal Permission Granted  Name::     Kristen Macias  Agency::  [employer]   Relationship::     Contact Information:  [phone]  Housing/Transportation Living arrangements for the past 2 months:  Mobile Home Source of Information:  Patient Patient Interpreter Needed:  None Criminal Activity/Legal Involvement Pertinent to Current Situation/Hospitalization:  No - Comment as needed Significant Relationships:  Adult Children, Other Family Members Lives with:  Self Do you feel safe going back to the place where you live?  Yes Need for family participation in patient care:  No (Coment)  Care giving concerns: Patient from home alone. Received call from family medicine clinic social worker indicating patient does not have working heat at home.   Social Worker assessment / plan: CSW met with patient at bedside. Patient alert and oriented and with pleasant affect. CSW assessed patient's living situation. Patient reported she lives in a mobile home independently and her heating system does not work. She stated she had a repairman come to the home and assess and he said the repairs would be $5000. Patient cannot afford the repair and has been using a space heater this winter. The space heater is running up her electric bill and she also does not use it at night for fear of fire hazard.   CSW referred patient to [employer] [facility]) for possible assistance with the repair. Kristen Macias at [facility] is following up and will contact CSW and/or patient directly, as patient may discharge today. Patient also aware of [employer] resources for [employer].  CSW  informed patient of the referral and gave [facility] contact information. CSW signing off. Please re-consult if additional needs identified during patient's admission.  Employment status:  Retired Forensic scientist:  Managed Medicare([employer]) PT Recommendations:  Not assessed at this time Information / Referral to community resources:  Other (Comment Required)([place] [employer], [employer])  Patient/Family's Response to care: Patient appreciative of referral and resources.  Patient/Family's Understanding of and Emotional Response to Diagnosis, Current Treatment, and Prognosis: Patient with good understanding of her medical condition, hopeful for discharge home today.  Emotional Assessment Appearance:  Appears stated age Attitude/Demeanor/Rapport:  Engaged Affect (typically observed):  Calm, Pleasant Orientation:  Oriented to Self, Oriented to Place, Oriented to  Time, Oriented to Situation Alcohol / Substance use:  Not Applicable Psych involvement (Current and /or in the community):  No (Comment)  Discharge Needs  Concerns to be addressed:  Care Coordination, Other (Comment Required(heating) Readmission within the last 30 days:  No Current discharge risk:  Other(no heat at home) Barriers to Discharge:  Continued Medical Work up   Estanislado Emms, LCSW 03/17/2017, 11:14 AM

## 2017-03-17 NOTE — Progress Notes (Signed)
Patient received discharge information and acknowledged understanding of it. Patient IV was removed.  

## 2017-03-17 NOTE — Progress Notes (Signed)
Family Medicine Teaching Service Daily Progress Note Intern Pager: [phone]  Patient name: Kristen Macias Medical record number: [id number] Date of birth: 11-08-1933 Age: 84 y.o. Gender: female  Primary Care Provider: Mayo, Pete Pelt, MD Consultants: GI, urology Code Status: DNR  Pt Overview and Major Events to Date:  1/21 Admitted  Assessment and Plan:  Kristen Macias is a 84 y.o. female presenting with symptomatic anemia. PMH is significant for COPD, atrial fibrillation on Eliquis, HLD, and chronic anemia.    Symptomatic anemia  Chronic anemia  Hgb 5.4 at admission. Baseline appears to be 10.5. S/P 2U of prbc. Felt to be secondary to her bladder mass. Low Iron and ferritin likely being iron deficiency anemia which could 2/2 chronic blood loss. HGB 7.1 on 1/22. Will recheck in am of 1/23 if stable can likely dc home. If <7 will likely give 1uPRB prior to dc. - f/u CBC on 1/23 - urine culture per urology recs - will use NQIS ACS risk calculator to assess patient's surgical candidacy ** - consider when to restart Eliquis and how long patient should be off of this prior to transurethral resection of tumor  - restart home ferrous sulfate 325 mg BIDWC  COPD: Stable. Patient with dyspnea during ambulation present before acute episode but has not been hospitalized for an exacerbation, so likely Gold criteria B.  Takes albuterol and spiriva PRN at home. Does not appear to have signs of exacerbation of hypoxia. - continue home medications - will need to educate patient on daily Spiriva use or discontinue Spiriva  Atrial fibrillation: Chronic. Rate controlled. Patient with history of atrial fibrillation also present today.  Has been taking Eliquis 5 mg BID and Lopressor 25 mg BID for this.  Atrial fibrillation could also be contributing to patient's weakness and palpitations. - continue home Lopressor - telemetry - will need to assess plan for anticoagulation in the setting of severe anemia  prior to discharge  HLD: Chronic. Last lipid panel: total cholesterol 208, TG 224, LEL 118, HDL 46.  Takes Lipitor 40 mg daily. - continue Lipitor  FEN/GI: NPO due to abdominal pain, will advance once CT results Prophylaxis: SCDs  Disposition: likely home for further urological outpatient workup  Subjective:  Patient has no complaints and asked appropriate questions in response to results of CT scan.  She expressed understanding and is looking forward to going home.  Objective: Temp:  [97.7 F (36.5 C)-98.4 F (36.9 C)] 97.9 F (36.6 C) (01/23 0536) Pulse Rate:  [82-98] 98 (01/23 0903) Resp:  [19-25] 22 (01/23 0536) BP: (100-123)/(51-83) 113/70 (01/23 0903) SpO2:  [93 %-97 %] 93 % (01/23 0536) Weight:  [185 lb 14.4 oz (84.3 kg)] 185 lb 14.4 oz (84.3 kg) (01/23 0536) Physical Exam  Constitutional: She is oriented to person, place, and time. She appears well-developed and well-nourished.  Patient appears pale  HENT:  Head: Normocephalic and atraumatic.  Eyes: EOM are normal. No scleral icterus.  Neck: Normal range of motion.  Cardiovascular: Normal rate.  Irregular rhythm  Pulmonary/Chest: Effort normal and breath sounds normal.  Abdominal: Soft. Bowel sounds are normal.  Musculoskeletal: Normal range of motion.  Neurological: She is alert and oriented to person, place, and time.  Skin: Skin is warm and dry.  Psychiatric: She has a normal mood and affect. Her behavior is normal. Judgment and thought content normal.   Laboratory: Recent Labs  Lab 03/15/17 1150 03/15/17 1400 03/16/17 0417  WBC  --  9.3 8.7  HGB 5.4*  5.7* 7.1*  HCT  --  20.8* 24.0*  PLT  --  404* 322   Recent Labs  Lab 03/15/17 1400 03/16/17 0417  NA 138 138  K 4.3 4.0  CL 106 106  CO2 20* 19*  BUN 16 11  CREATININE 0.93 0.92  CALCIUM 9.0 8.5*  PROT 6.8  --   BILITOT 0.7  --   ALKPHOS 111  --   ALT 13*  --   AST 20  --   GLUCOSE 122* 94    Imaging/Diagnostic Tests: Ct Abdomen Pelvis  W Contrast  Result Date: 03/15/2017 CLINICAL DATA:  Abdominal pain, weakness, lack of energy, anemia, history GERD, atrial fibrillation EXAM: CT ABDOMEN AND PELVIS WITH CONTRAST TECHNIQUE: Multidetector CT imaging of the abdomen and pelvis was performed using the standard protocol following bolus administration of intravenous contrast. Sagittal and coronal MPR images reconstructed from axial data set. CONTRAST:  142mL ISOVUE-300 IOPAMIDOL (ISOVUE-300) INJECTION 61% IV. No oral contrast administered. COMPARISON:  None FINDINGS: Lower chest: Minimal pleural effusions bilaterally greater on RIGHT. Hepatobiliary: Dependent calculi in gallbladder. No gross gallbladder wall thickening or pericholecystic infiltration. Liver unremarkable. No biliary dilatation. Pancreas: Normal appearance Spleen: Normal appearance Adrenals/Urinary Tract: Adrenal glands, kidneys, and ureters normal appearance. Enhancing soft tissue mass at the anterior LEFT aspect of the urinary bladder, 2.6 x 2.1 x 1.8 cm highly suspicious for a bladder neoplasm. Remainder of bladder unremarkable. Stomach/Bowel: Appendix surgically absent by history. Extensive diverticulosis of descending and sigmoid colon without definite colonic wall thickening or pericolic infiltrative changes to suggest acute diverticulitis. Stomach decompressed with moderate-sized hiatal hernia noted. Small bowel loops and remainder of colon normal appearance. Vascular/Lymphatic: Atherosclerotic calcifications aorta and iliac arteries. No adenopathy. Reproductive: Uterus surgically absent. Nonvisualization of ovaries. Other: No free air or free fluid. No definite inflammatory process. Tiny umbilical hernia containing fat. Musculoskeletal: Mild scattered degenerative changes of the lumbar spine. Chronic appearing superior endplate deformity of L1 vertebral body noted. No acute osseous findings. IMPRESSION: Distal colonic diverticulosis without evidence of diverticulitis. 2.6 x 2.1 x  1.8 cm diameter LEFT anterior bladder mass highly suspicious for a bladder neoplasm; further assessment by cystoscopy recommended. Cholelithiasis. Hiatal hernia. Tiny pleural effusions greater on RIGHT. Aortic Atherosclerosis (ICD10-I70.0). Electronically Signed   By: Lavonia Dana M.D.   On: 03/15/2017 16:22     Guadalupe Dawn, MD 03/17/2017, 9:47 AM PGY-1, [place] Intern pager: [phone], text pages welcome

## 2017-03-17 NOTE — Discharge Instructions (Addendum)
You were admitted for low blood counts likely secondary to a bladder mass. You were given blood which resolved your symptoms. You have a follow up appointment with Urology to evaluate removal of that bladder mass. They will call you to schedule this so please be on the lookout for that call. You also have a follow up appointment scheduled with Dr. Criss Rosales over at the family medicine clinic for hospital follow up on 2/1 at 2:25PM. Please arrive 15 minutes early. No changes were made to your medications.    Information on my medicine - ELIQUIS (apixaban)  Why was Eliquis prescribed for you? Eliquis was prescribed for you to reduce the risk of a blood clot forming that can cause a stroke if you have a medical condition called atrial fibrillation (a type of irregular heartbeat).  What do You need to know about Eliquis ? Take your Eliquis TWICE DAILY - one tablet in the morning and one tablet in the evening with or without food. If you have difficulty swallowing the tablet whole please discuss with your pharmacist how to take the medication safely.  Take Eliquis exactly as prescribed by your doctor and DO NOT stop taking Eliquis without talking to the doctor who prescribed the medication.  Stopping may increase your risk of developing a stroke.  Refill your prescription before you run out.  After discharge, you should have regular check-up appointments with your healthcare provider that is prescribing your Eliquis.  In the future your dose may need to be changed if your kidney function or weight changes by a significant amount or as you get older.  What do you do if you miss a dose? If you miss a dose, take it as soon as you remember on the same day and resume taking twice daily.  Do not take more than one dose of ELIQUIS at the same time to make up a missed dose.  Important Safety Information A possible side effect of Eliquis is bleeding. You should call your healthcare provider right away if  you experience any of the following: ? Bleeding from an injury or your nose that does not stop. ? Unusual colored urine (red or dark brown) or unusual colored stools (red or black). ? Unusual bruising for unknown reasons. ? A serious fall or if you hit your head (even if there is no bleeding).  Some medicines may interact with Eliquis and might increase your risk of bleeding or clotting while on Eliquis. To help avoid this, consult your healthcare provider or pharmacist prior to using any new prescription or non-prescription medications, including herbals, vitamins, non-steroidal anti-inflammatory drugs (NSAIDs) and supplements.  This website has more information on Eliquis (apixaban): http://www.eliquis.com/eliquis/home

## 2017-03-17 NOTE — Discharge Summary (Signed)
[facility] Discharge Summary  Patient name: Kristen Macias Medical record number: [id number] Date of birth: 1933/07/06 Age: 84 y.o. Gender: female Date of Admission: 03/15/2017  Date of Discharge: 03/17/17 Admitting Physician: Dickie La, MD  Primary Care Provider: Mayo, Pete Pelt, MD Consultants: Urology, GI  Indication for Hospitalization: symptomatic anemia  Discharge Diagnoses/Problem List:  Symptomatic anemia COPD Atrial fibrillation HLD  Disposition: Home  Discharge Condition: stable, Hgb 7.9  Discharge Exam:  General: NAD, A&Ox3, resting, pleasant HEENT: [place]/AT, EOMI, MMM Pulmonary: Normal work of breathing on room air Cardiovascular: Irregular rhythm, no murmurs, palpable pulses Abdomen: soft, nondistended, some tenderness in suprapubic region GU: no CVA tenderness Extremities: warm and well perfused, no edema Neuro: no focal neurological deficits   Brief Hospital Course:  Ms. Manske was admitted from [facility] clinic on 1/21 with symptoms of symptomatic anemia and a hemoglobin of 5.4.  She declined an FOBT in the office.  Patient's Eliquis was also stopped given her severe anemia.  On exam at admission, patient appeared pale and had some tenderness to abdominal palpation, so a CT abdomen/pelvis with contrast was ordered.  This study showed a 2 cm mass in the anterior bladder.  Given patient's anemia, report of hematuria in the past few months, age, and smoking history, a bladder neoplasm was considered likely, so urology was consulted.  GI was also consulted before the CT result was known, and they signed off once the bladder mass was found.  Patient was transfused 2 units of blood, and her Hgb increased to 7.1 the following morning.  Urology saw and examined the patient on 1/22 and recommended a urine culture and said that patient was okay to restart Eliquis from their perspective.  Urine culture was negative.  They discussed with the patient the need  for outpatient cystoscopy and likely transurethral tumor removal in the future.  Patient was restarted on Eliquis at discharge and will follow up with urology outpatient.  Issues for Follow Up:  1. Comment on surgical risk and length of time to hold Eliquis prior to transurethral resection of tumor.  ACS NSQIP indicates that patient will have about average risk for this surgery. 2. Patient's CBC should be checked at her next visit due to likely ongoing anemia and concurrent Eliquis use. 3. Patient only takes Spiriva as needed, so she should either be counseled on taking this medication daily or have this medication discontinued.  Significant Procedures: blood transfusion  Significant Labs and Imaging:  Recent Labs  Lab 03/15/17 1400 03/16/17 0417 03/17/17 0923  WBC 9.3 8.7 9.5  HGB 5.7* 7.1* 7.9*  HCT 20.8* 24.0* 27.9*  PLT 404* 322 364   Recent Labs  Lab 03/15/17 1400 03/16/17 0417  NA 138 138  K 4.3 4.0  CL 106 106  CO2 20* 19*  GLUCOSE 122* 94  BUN 16 11  CREATININE 0.93 0.92  CALCIUM 9.0 8.5*  ALKPHOS 111  --   AST 20  --   ALT 13*  --   ALBUMIN 3.4*  --      Results/Tests Pending at Time of Discharge: none  Discharge Medications:  Allergies as of 03/17/2017   No Known Allergies     Medication List    TAKE these medications   alendronate 70 MG tablet Commonly known as:  FOSAMAX Take 1 tablet (70 mg total) by mouth once a week. Take with a full glass of water on an empty stomach.   atorvastatin 40 MG tablet  Commonly known as:  LIPITOR Take 40 mg by mouth daily.   Calcium-Vitamin D 600-400 MG-UNIT Tabs Take 2 tablets by mouth daily. What changed:    how much to take  when to take this   docusate sodium 100 MG capsule Commonly known as:  COLACE Take 1 capsule (100 mg total) by mouth 2 (two) times daily.   ELIQUIS 5 MG Tabs tablet Generic drug:  apixaban TAKE 1 TABLET TWICE DAILY What changed:    how much to take  how to take this  when to  take this   ferrous sulfate 325 (65 FE) MG tablet Commonly known as:  FERROUSUL Take 1 tablet (325 mg total) by mouth 2 (two) times daily with a meal.   metoprolol tartrate 25 MG tablet Commonly known as:  LOPRESSOR TAKE 2 TABLETS EVERY DAY What changed:    how much to take  how to take this  when to take this   multivitamin with minerals Tabs tablet Take 1 tablet by mouth daily. Centrum Silver   omeprazole 20 MG capsule Commonly known as:  PRILOSEC TAKE 1 CAPSULE TWICE DAILY  BEFORE  A  MEAL What changed:  See the new instructions.   polyethylene glycol powder powder Commonly known as:  GLYCOLAX/MIRALAX Take 17 g by mouth daily. What changed:  additional instructions   ranitidine 150 MG capsule Commonly known as:  ZANTAC TAKE 1 CAPSULE TWICE DAILY FOR ACID RELUX   tiotropium 18 MCG inhalation capsule Commonly known as:  SPIRIVA HANDIHALER INHALE THE CONTENTS OF 1 CAPSULE EVERY DAY What changed:    how much to take  how to take this  when to take this  reasons to take this  additional instructions   VENTOLIN HFA 108 (90 Base) MCG/ACT inhaler Generic drug:  albuterol INHALE 1 TO 2 PUFFS INTO THE LUNGS EVERY 6 (SIX) HOURS AS NEEDED FOR WHEEZING OR SHORTNESS OF BREATH.       Discharge Instructions: Please refer to Patient Instructions section of EMR for full details.  Patient was counseled important signs and symptoms that should prompt return to medical care, changes in medications, dietary instructions, activity restrictions, and follow up appointments.   Follow-Up Appointments:   Kathrene Alu, MD 03/18/2017, 12:04 AM PGY-1, [place]

## 2017-03-18 ENCOUNTER — Telehealth: Payer: Self-pay | Admitting: *Deleted

## 2017-03-18 NOTE — Consult Note (Signed)
           [facility] CM Primary Care Navigator  03/18/2017  Kristen Macias 11-Oct-1933 [id number]   Went to seepatient at the bedsideto identify possible discharge needs but she was alreadydischargedper staff report.  Patient wasadmitted forsymptomatic anemiaand had blood transfusion.  Primary care provider's officeis listed asprovidingtransition of care (TOC). Primary care provider's office called(Lauren)to notify of patient's health issues needing follow-up (Anemia/ COPD). Made aware to refer patient to [facility] care management if deemed necessary and appropriate for services.  Patient noted with a follow-up schedule with primary care provider on 03/26/17 at 2:25 pm.   For questions, please contact:  Dannielle Huh, BSN, RN- [facility] Primary Care Navigator  Telephone: [phone] [place]

## 2017-03-18 NOTE — Telephone Encounter (Signed)
Received message on nurse line from [place] with [facility] stating she feels pt would be good candidate for [facility] referral for disease management. Also reports pt taking Spiriva prn instead of daily as directed. Edwena Felty may be reached at [phone] for any questions. Hubbard Hartshorn, RN, BSN

## 2017-03-19 ENCOUNTER — Other Ambulatory Visit: Payer: Self-pay | Admitting: Internal Medicine

## 2017-03-19 DIAGNOSIS — J441 Chronic obstructive pulmonary disease with (acute) exacerbation: Secondary | ICD-10-CM

## 2017-03-19 NOTE — Telephone Encounter (Signed)
Referral placed to [facility]. Thanks!

## 2017-03-23 ENCOUNTER — Other Ambulatory Visit: Payer: Self-pay | Admitting: Internal Medicine

## 2017-03-26 ENCOUNTER — Inpatient Hospital Stay: Payer: Medicare HMO | Admitting: Family Medicine

## 2017-03-26 DIAGNOSIS — C679 Malignant neoplasm of bladder, unspecified: Secondary | ICD-10-CM

## 2017-03-26 HISTORY — DX: Malignant neoplasm of bladder, unspecified: C67.9

## 2017-03-29 DIAGNOSIS — C671 Malignant neoplasm of dome of bladder: Secondary | ICD-10-CM | POA: Diagnosis not present

## 2017-03-31 ENCOUNTER — Telehealth: Payer: Self-pay

## 2017-03-31 ENCOUNTER — Other Ambulatory Visit: Payer: Self-pay | Admitting: Urology

## 2017-03-31 NOTE — Telephone Encounter (Signed)
Pam from [facility] left message on nurse line that patient is scheduled 04/21/17 for transurethral resection of bladder mass. Requests surgical clearance and permission to hold Eliquis. Phone number is [phone] ext [phone] and fax number is [phone] if you want to send a note giving clearance and permission to hold Eliquis. Danley Danker, RN [facility] RN)

## 2017-04-01 NOTE — Telephone Encounter (Signed)
Returned Pam's call and left a voicemail for her. Would prefer to defer surgical clearance and permission to hold Eliquis to patient's cardiologist, Dr. Holland Commons, as he is the person that prescribes the Eliquis and sees her for her atrial fibrillation. Pam can call our office back if there are any issues with this. Thanks!

## 2017-04-02 ENCOUNTER — Telehealth: Payer: Self-pay | Admitting: Interventional Cardiology

## 2017-04-02 NOTE — Telephone Encounter (Signed)
° °   [facility] Pre-operative Risk Assessment    Request for surgical clearance:  1. What type of surgery is being performed?Resect Bladder Tumor   2. When is this surgery scheduled? 04-22-17   3. What type of clearance is required (medical clearance vs. Pharmacy clearance to hold med vs. Both)? General Cardiac 4.  5. Are there any medications that need to be held prior to surgery and how long?Can pt hold Eliquis for 2 days prior   6. Practice name and name of physician performing surgery? *Dr Justine Null   7. What is your office phone and fax number? [phone] and fax is [phone]   8. Anesthesia type (None, local, MAC, general) ?General   Glyn Ade 04/02/2017, 9:44 AM  _________________________________________________________________   (provider comments below)

## 2017-04-02 NOTE — Telephone Encounter (Signed)
Pt takes Eliquis for afib with CHADS2VASc score of 3 (age x2, gender). She also has hx of unprovoked DVT in 2015 tx for 6 months. Renal function is normal. Ok to hold Eliquis for 2 days prior as requested.

## 2017-04-02 NOTE — Telephone Encounter (Signed)
   Primary Cardiologist: Larae Grooms, MD  Chart reviewed as part of pre-operative protocol coverage. Given past medical history and time since last visit, based on ACC/AHA guidelines, Kristen Macias would be at acceptable risk for the planned procedure without further cardiovascular testing.  She does her own housework and has no hx of CAD.  Will check with Pharmacy on Eliquis.  I will route this recommendation to the requesting party via Epic fax function and remove from pre-op pool.  Please call with questions.  Cecilie Kicks, NP 04/02/2017, 3:47 PM

## 2017-04-06 ENCOUNTER — Other Ambulatory Visit: Payer: Self-pay | Admitting: *Deleted

## 2017-04-07 NOTE — Patient Outreach (Signed)
[facility]) Care Management  04/06/17  Kristen Macias 04/22/1933 [id number]   Telephone Screen  Referral Date: 03/22/17 Referral Source: MD referral Referral Reason: COPD/Pneumona (PNA) Insurance: Josephine Igo, Medicaid  Outreach attempt # 1 to patient and HIPAA identifiers verified. Patient acknowledged being in the hospital from 03/15/17 to 03/17/17 for Anemia and COPD She stated that she had to have 2 blood transfusions during her hospital stay. Patient has a follow-up lab draw on 04/19/17. Patient discussed being diagnosed with COPD "for a long period of time". Patient discussed using her inhaler when she experiences complications with COPD, "which usually helps". She stated, she tries to walk around outside when it is pretty. She stated, in the morning she feeds her cats. She stated, on a day like yesterday, she stays in the house due to the weather.   Patient stated, she has a scheduled procedure on 04/22/17. Per patient, the MD will be removing a mass on her bladder. Patient stated, the procedure will be an in and out procedure. She stated, her daughter lives "in the same [place] and can assist her after her procedure, if needed". Patient reported, she will be discharged with a catheter. She doesn't know which kind of catheter will be placed. RN CM discussed with patient to contact the nurse at the MD's office to discuss the kind of catheter she will be discharged home with. Patient plans to contact the nurse after she receives her pre-procedural instructions in the mail. Patient stated, she is hopeful about the procedure due to having to urinate every 1 to 2 hours during the night. Patient verbalized having transportation to her procedure and medical appointments. Per EMR, patient will be having a transurethral resection of bladder tumor (TURBT). [facility] services and benefits explained to patient. Patient agreed to services. She stated, if she qualifies, maybe she could use some  assistance after the procedure.    Plan: RM CM will send successful outreach letter to patient. RN CM will send [facility] SW referral for possible personal care assistance. RN CM patient EMMI education material on TURBT and COPD. RN CM advised patient to contact RNCM for any needs or concerns.    Lake Bells, RN, BSN, MHA/MSL, [place] Telephonic Care Manager Coordinator [facility] Direct Phone: [phone] Toll Free: [phone] Fax: [phone]

## 2017-04-08 ENCOUNTER — Encounter: Payer: Self-pay | Admitting: *Deleted

## 2017-04-08 ENCOUNTER — Other Ambulatory Visit: Payer: Self-pay | Admitting: *Deleted

## 2017-04-08 NOTE — Patient Outreach (Signed)
[facility]) Care Management  04/08/2017  SHATIA SINDONI 1933-05-05 [id number]  CSW was able to make initial contact with patient today to perform phone assessment, as well as assess and assist with social work needs and services.  CSW obtained two HIPAA compliant identifiers from patient, which included patient's name and date of birth. CSW introduced self, explained role and types of services provided through [place] [facility] ([place] [facility]).  CSW then explained the reason for the call, indicating that patient's [facility] RNCM thought that patient would benefit from social work services and resources to assist with some personal care in the home.   CSW has planned an in home visit for next week .    Eduard Clos, MSW, [place] Worker  [place] [phone]

## 2017-04-12 ENCOUNTER — Encounter: Payer: Self-pay | Admitting: *Deleted

## 2017-04-12 ENCOUNTER — Other Ambulatory Visit: Payer: Self-pay | Admitting: *Deleted

## 2017-04-12 DIAGNOSIS — N159 Renal tubulo-interstitial disease, unspecified: Secondary | ICD-10-CM

## 2017-04-12 HISTORY — DX: Renal tubulo-interstitial disease, unspecified: N15.9

## 2017-04-12 NOTE — Patient Outreach (Addendum)
[facility]) Care Management  [facility] Social Work  04/12/2017  Kristen Macias April 06, 1933 [id number]  Subjective:  "I am going to have a tumor removed later this month"  Objective:  [facility] CSW to assist patient and family with community based resources to aide in their well-being, quality of life and overall safety and needs.    Encounter Medications:  Outpatient Encounter Medications as of 04/12/2017  Medication Sig  . alendronate (FOSAMAX) 70 MG tablet Take 1 tablet (70 mg total) by mouth once a week. Take with a full glass of water on an empty stomach. (Patient taking differently: Take 70 mg by mouth every Sunday. Take with a full glass of water on an empty stomach.)  . atorvastatin (LIPITOR) 40 MG tablet Take 40 mg by mouth daily.  . Calcium Carb-Cholecalciferol (CALCIUM-VITAMIN D) 600-400 MG-UNIT TABS Take 2 tablets by mouth daily. (Patient taking differently: Take 0.5 tablets by mouth 2 (two) times daily. )  . ELIQUIS 5 MG TABS tablet TAKE 1 TABLET TWICE DAILY (Patient taking differently: TAKE 1 TABLET (5 MG) TWICE DAILY)  . metoprolol tartrate (LOPRESSOR) 25 MG tablet TAKE 2 TABLETS EVERY DAY (Patient taking differently: TAKE ONE TABLET (25 MG) BY MOUTH TWICE DAILY)  . omeprazole (PRILOSEC) 20 MG capsule TAKE 1 CAPSULE TWICE DAILY  BEFORE  A  MEAL (Patient taking differently: TAKE 1 CAPSULE (20 MG) DAILY BEFORE BREAKFAST, MAY TAKE AN ADDITIONAL DOSE AS NEEDED FOR HEARTBURN OR INDIGESTION.)  . polyethylene glycol powder (GLYCOLAX/MIRALAX) powder Take 17 g by mouth daily. (Patient taking differently: Take 17 g by mouth daily. Mix in coffee and drink)  . tiotropium (SPIRIVA HANDIHALER) 18 MCG inhalation capsule INHALE THE CONTENTS OF 1 CAPSULE EVERY DAY (Patient taking differently: Place 18 mcg into inhaler and inhale daily. INHALE THE CONTENTS OF 1 CAPSULE EVERY DAY)  . VENTOLIN HFA 108 (90 Base) MCG/ACT inhaler INHALE 1 TO 2 PUFFS INTO THE LUNGS EVERY 6 (SIX) HOURS AS NEEDED  FOR WHEEZING OR SHORTNESS OF BREATH.  Marland Kitchen docusate sodium (COLACE) 100 MG capsule Take 1 capsule (100 mg total) by mouth 2 (two) times daily. (Patient not taking: Reported on 03/15/2017)  . ferrous sulfate (FERROUSUL) 325 (65 FE) MG tablet Take 1 tablet (325 mg total) by mouth 2 (two) times daily with a meal. (Patient not taking: Reported on 03/15/2017)  . Multiple Vitamin (MULTIVITAMIN WITH MINERALS) TABS tablet Take 1 tablet by mouth daily. Centrum Silver  . ranitidine (ZANTAC) 150 MG capsule TAKE 1 CAPSULE TWICE DAILY FOR ACID REFLUX (Patient not taking: Reported on 04/08/2017)   No facility-administered encounter medications on file as of 04/12/2017.     Functional Status:  In your present state of health, do you have any difficulty performing the following activities: 04/12/2017 03/16/2017  Hearing? N N  Vision? Y N  Difficulty concentrating or making decisions? N N  Walking or climbing stairs? N Y  Dressing or bathing? N N  Doing errands, shopping? N Y  Conservation officer, nature and eating ? N -  Using the Toilet? N -  In the past six months, have you accidently leaked urine? N -  Do you have problems with loss of bowel control? N -  Managing your Medications? N -  Managing your Finances? N -  Housekeeping or managing your Housekeeping? N -  Some recent data might be hidden    Fall/Depression Screening:  PHQ 2/9 Scores 04/12/2017 04/06/2017 03/15/2017 12/25/2016 11/10/2016 11/02/2016 09/09/2016  PHQ - 2 Score 1 0 0  0 0 0 0    Assessment:  CSW met with patient in her home today to complete assessment.  CSW provided patient with [facility] Welcome Packet and have consent signed.  Per patient, she will be having a tumor on her bladder removed later this month.  She reports being quite independent and only needing help with her catheter which she has been told will be placed at time of her surgery.  CSW completed assessment and based on findings, patient is well linked with medicaid services as well as [employer]  benefits and services offered.   CSW discussed possible personal care services/support; again to which she adamantly denies need for help with bathing, dressing, housekeeping, etc . She is quite proud of her ability and independence and thus she unsure and uncertain she will need anything  post surgery-  CSW has offered to call her after her surgery for update/assessment of further needs.  CSW also observed an old roof leak (per patient) that was concerning for mold/mildew,etc.  Patient thinks her family can repair this but is also open to CSW seeking some support for getting this repaired if not done by end of the month.    Plan:  [facility] CM Care Plan Problem One     Most Recent Value  Care Plan Problem One  Patient having surgery and wants help at home.  Role Documenting the Problem One  Clinical Social Worker  Care Plan for Problem One  Active  Interventions for Problem One Long Term Goal     [facility] CM Short Term Goal #1    Patient will report adequate support at home post surgery in the next 30 days.   [facility] CM Short Term Goal #1 Start Date  04/12/17  Interventions for Short Term Goal #1  CSW coordinating with PCP and Urologist to have supportive services aranged  post surgery.       CSW will call PCP, Urologist  Update patient on plans to coordinate personal care.    Janet Caldwell, MSW, LCSW Clinical Social Worker  [facility] [phone]  

## 2017-04-14 ENCOUNTER — Encounter (HOSPITAL_COMMUNITY): Payer: Self-pay

## 2017-04-14 NOTE — Pre-Procedure Instructions (Signed)
The following are in epic: Cardiac Clearance L. Ingold, NP 04/02/2017 EKG 03/15/2017 Last office visit note Dr. Mingo Amber 03/15/2017 ECHO 08/25/2016

## 2017-04-14 NOTE — Patient Instructions (Addendum)
Your procedure is scheduled on: Thursday, Feb. 28, 2019   Surgery Time:  11:45AM-1:15PM   Report to [facility] Main  Entrance    Report to admitting at 9:45 AM   Call this number if you have problems the morning of surgery [phone]   Do not eat food or drink liquids :After Midnight.   Do NOT smoke after Midnight   Take these medicines the morning of surgery with A SIP OF WATER: Atorvastatin,Omeprazole, Metoprolol   Use Inhaler per normal routine and bring rescue inhaler day of surgery                               You may not have any metal on your body including hair pins, jewelry, and body piercings             Do not wear make-up, lotions, powders, perfumes/cologne, or deodorant             Do not wear nail polish.  Do not shave  48 hours prior to surgery.              Do not bring valuables to the hospital. [place].   Contacts, dentures or bridgework may not be worn into surgery.   Patients discharged the day of surgery will not be allowed to drive home.   Name and phone number of your driver:   Special Instructions: Bring a copy of your healthcare power of attorney and living will documents         the day of surgery if you haven't scanned them in before.              Please read over the following fact sheets you were given:  [facility] - Preparing for Surgery Before surgery, you can play an important role.  Because skin is not sterile, your skin needs to be as free of germs as possible.  You can reduce the number of germs on your skin by washing with CHG (chlorahexidine gluconate) soap before surgery.  CHG is an antiseptic cleaner which kills germs and bonds with the skin to continue killing germs even after washing. Please DO NOT use if you have an allergy to CHG or antibacterial soaps.  If your skin becomes reddened/irritated stop using the CHG and inform your nurse when you arrive at Short Stay. Do  not shave (including legs and underarms) for at least 48 hours prior to the first CHG shower.  You may shave your face/neck.  Please follow these instructions carefully:  1.  Shower with CHG Soap the night before surgery and the  morning of surgery.  2.  If you choose to wash your hair, wash your hair first as usual with your normal  shampoo.  3.  After you shampoo, rinse your hair and body thoroughly to remove the shampoo.                             4.  Use CHG as you would any other liquid soap.  You can apply chg directly to the skin and wash.  Gently with a scrungie or clean washcloth.  5.  Apply the CHG Soap to your body ONLY FROM THE NECK DOWN.   Do   not use  on face/ open                           Wound or open sores. Avoid contact with eyes, ears mouth and   genitals (private parts).                       Wash face,  Genitals (private parts) with your normal soap.             6.  Wash thoroughly, paying special attention to the area where your    surgery  will be performed.  7.  Thoroughly rinse your body with warm water from the neck down.  8.  DO NOT shower/wash with your normal soap after using and rinsing off the CHG Soap.                9.  Pat yourself dry with a clean towel.            10.  Wear clean pajamas.            11.  Place clean sheets on your bed the night of your first shower and do not  sleep with pets. Day of Surgery : Do not apply any lotions/deodorants the morning of surgery.  Please wear clean clothes to the hospital/surgery center.  FAILURE TO FOLLOW THESE INSTRUCTIONS MAY RESULT IN THE CANCELLATION OF YOUR SURGERY  PATIENT SIGNATURE_________________________________  NURSE SIGNATURE__________________________________  ________________________________________________________________________

## 2017-04-19 ENCOUNTER — Ambulatory Visit: Payer: Medicare HMO | Admitting: *Deleted

## 2017-04-19 ENCOUNTER — Other Ambulatory Visit: Payer: Self-pay | Admitting: *Deleted

## 2017-04-19 ENCOUNTER — Other Ambulatory Visit: Payer: Self-pay

## 2017-04-19 ENCOUNTER — Ambulatory Visit (HOSPITAL_COMMUNITY)
Admission: RE | Admit: 2017-04-19 | Discharge: 2017-04-19 | Disposition: A | Discharge status: Home / Self Care | Payer: Medicare HMO | Source: Ambulatory Visit | Attending: Anesthesiology | Admitting: Anesthesiology

## 2017-04-19 ENCOUNTER — Encounter (HOSPITAL_COMMUNITY)
Admission: RE | Admit: 2017-04-19 | Discharge: 2017-04-19 | Disposition: A | Discharge status: Home / Self Care | Payer: Medicare HMO | Source: Ambulatory Visit | Attending: Urology | Admitting: Urology

## 2017-04-19 ENCOUNTER — Encounter (HOSPITAL_COMMUNITY): Payer: Self-pay

## 2017-04-19 DIAGNOSIS — Z01818 Encounter for other preprocedural examination: Secondary | ICD-10-CM | POA: Diagnosis not present

## 2017-04-19 DIAGNOSIS — N329 Bladder disorder, unspecified: Secondary | ICD-10-CM | POA: Diagnosis not present

## 2017-04-19 DIAGNOSIS — Z01812 Encounter for preprocedural laboratory examination: Secondary | ICD-10-CM | POA: Insufficient documentation

## 2017-04-19 DIAGNOSIS — I517 Cardiomegaly: Secondary | ICD-10-CM | POA: Diagnosis not present

## 2017-04-19 HISTORY — DX: Atherosclerosis of aorta: I70.0

## 2017-04-19 HISTORY — DX: Unspecified cataract: H26.9

## 2017-04-19 HISTORY — DX: Renal tubulo-interstitial disease, unspecified: N15.9

## 2017-04-19 HISTORY — DX: Cardiomegaly: I51.7

## 2017-04-19 HISTORY — DX: Wedge compression fracture of unspecified thoracic vertebra, initial encounter for closed fracture: S22.000A

## 2017-04-19 HISTORY — DX: Unspecified rotator cuff tear or rupture of left shoulder, not specified as traumatic: M75.102

## 2017-04-19 HISTORY — DX: Personal history of other venous thrombosis and embolism: Z86.718

## 2017-04-19 HISTORY — DX: Age-related osteoporosis without current pathological fracture: M81.0

## 2017-04-19 HISTORY — DX: Personal history of other medical treatment: Z92.89

## 2017-04-19 HISTORY — DX: Calculus of gallbladder without cholecystitis without obstruction: K80.20

## 2017-04-19 HISTORY — DX: Personal history of other diseases of the digestive system: Z87.19

## 2017-04-19 HISTORY — DX: Hyperlipidemia, unspecified: E78.5

## 2017-04-19 LAB — CBC
HCT: 30.5 % — ABNORMAL LOW (ref 36.0–46.0)
HEMOGLOBIN: 9 g/dL — AB (ref 12.0–15.0)
MCH: 21.4 pg — AB (ref 26.0–34.0)
MCHC: 29.5 g/dL — ABNORMAL LOW (ref 30.0–36.0)
MCV: 72.4 fL — AB (ref 78.0–100.0)
PLATELETS: 328 10*3/uL (ref 150–400)
RBC: 4.21 MIL/uL (ref 3.87–5.11)
RDW: 24.5 % — ABNORMAL HIGH (ref 11.5–15.5)
WBC: 10.2 10*3/uL (ref 4.0–10.5)

## 2017-04-19 LAB — BASIC METABOLIC PANEL
ANION GAP: 9 (ref 5–15)
BUN: 20 mg/dL (ref 6–20)
CHLORIDE: 107 mmol/L (ref 101–111)
CO2: 23 mmol/L (ref 22–32)
CREATININE: 0.92 mg/dL (ref 0.44–1.00)
Calcium: 9.3 mg/dL (ref 8.9–10.3)
GFR calc non Af Amer: 57 mL/min — ABNORMAL LOW (ref >60)
Glucose, Bld: 129 mg/dL — ABNORMAL HIGH (ref 65–99)
POTASSIUM: 5.1 mmol/L (ref 3.5–5.1)
Sodium: 139 mmol/L (ref 135–145)

## 2017-04-19 NOTE — Patient Outreach (Deleted)
[place] [facility]) Care Management  [place]  04/19/2017   Kristen Macias 1934-01-22 [id number]  Subjective: "heading to get my bloodwork done"  Objective: [facility] CSW to assist patient and family with community based resources to aide in their well-being, quality of life and overall safety and needs.    Encounter Medications:  Outpatient Encounter Medications as of 04/19/2017  Medication Sig  . alendronate (FOSAMAX) 70 MG tablet Take 1 tablet (70 mg total) by mouth once a week. Take with a full glass of water on an empty stomach. (Patient taking differently: Take 70 mg by mouth every Sunday. Take with a full glass of water on an empty stomach.)  . atorvastatin (LIPITOR) 40 MG tablet Take 40 mg by mouth daily.  . Calcium Carb-Cholecalciferol (CALCIUM-VITAMIN D) 600-400 MG-UNIT TABS Take 2 tablets by mouth daily. (Patient taking differently: Take 0.5 tablets by mouth 2 (two) times daily. )  . docusate sodium (COLACE) 100 MG capsule Take 1 capsule (100 mg total) by mouth 2 (two) times daily. (Patient not taking: Reported on 03/15/2017)  . ELIQUIS 5 MG TABS tablet TAKE 1 TABLET TWICE DAILY (Patient taking differently: TAKE 1 TABLET (5 MG) TWICE DAILY)  . ferrous sulfate (FERROUSUL) 325 (65 FE) MG tablet Take 1 tablet (325 mg total) by mouth 2 (two) times daily with a meal. (Patient not taking: Reported on 03/15/2017)  . metoprolol tartrate (LOPRESSOR) 25 MG tablet TAKE 2 TABLETS EVERY DAY (Patient taking differently: TAKE ONE TABLET (25 MG) BY MOUTH TWICE DAILY)  . Multiple Vitamin (MULTIVITAMIN WITH MINERALS) TABS tablet Take 1 tablet by mouth daily. Centrum Silver  . omeprazole (PRILOSEC) 20 MG capsule TAKE 1 CAPSULE TWICE DAILY  BEFORE  A  MEAL (Patient taking differently: TAKE 1 CAPSULE (20 MG) DAILY BEFORE BREAKFAST, MAY TAKE AN ADDITIONAL DOSE AS NEEDED FOR HEARTBURN OR INDIGESTION.)  . polyethylene glycol powder (GLYCOLAX/MIRALAX) powder Take 17 g by mouth daily. (Patient  taking differently: Take 17 g by mouth daily. Mix in coffee and drink)  . ranitidine (ZANTAC) 150 MG capsule TAKE 1 CAPSULE TWICE DAILY FOR ACID REFLUX (Patient not taking: Reported on 04/08/2017)  . tiotropium (SPIRIVA HANDIHALER) 18 MCG inhalation capsule INHALE THE CONTENTS OF 1 CAPSULE EVERY DAY (Patient taking differently: Place 18 mcg into inhaler and inhale daily. INHALE THE CONTENTS OF 1 CAPSULE EVERY DAY)  . VENTOLIN HFA 108 (90 Base) MCG/ACT inhaler INHALE 1 TO 2 PUFFS INTO THE LUNGS EVERY 6 (SIX) HOURS AS NEEDED FOR WHEEZING OR SHORTNESS OF BREATH.   No facility-administered encounter medications on file as of 04/19/2017.     Functional Status:  In your present state of health, do you have any difficulty performing the following activities: 04/12/2017 03/16/2017  Hearing? N N  Vision? Y N  Difficulty concentrating or making decisions? N N  Walking or climbing stairs? N Y  Dressing or bathing? N N  Doing errands, shopping? N Y  Conservation officer, nature and eating ? N -  Using the Toilet? N -  In the past six months, have you accidently leaked urine? N -  Do you have problems with loss of bowel control? N -  Managing your Medications? N -  Managing your Finances? N -  Housekeeping or managing your Housekeeping? N -  Some recent data might be hidden    Fall/Depression Screening: Fall Risk  04/12/2017 04/06/2017 03/15/2017  Falls in the past year? No No Yes  Number falls in past yr: - - -  Injury with Fall? - - No  Risk for fall due to : History of fall(s) - -   PHQ 2/9 Scores 04/12/2017 04/06/2017 03/15/2017 12/25/2016 11/10/2016 11/02/2016 09/09/2016  PHQ - 2 Score 1 0 0 0 0 0 0    Assessment: CSW spoke with patient by phone who reports she is doing well and heading to get her blood work drawn for pre-op. She continues to decline any needs for personal care/inhome assistance aside from the need for catheter care.  She anticipates this being arranged while at hospital for bladder surgery and is  unsure if she will be admitted.  CSW   Plan:   [facility] CM Care Plan Problem One     Most Recent Value  Care Plan Problem One  Patient having surgery and wants help at home.  Role Documenting the Problem One  Clinical Social Worker  Care Plan for Problem One  Active  [facility] Long Term Goal   Patient will report adequate in home support at home next 31 days.   [facility] Long Term Goal Start Date  04/19/17  Interventions for Problem One Long Term Goal  CSW assisting with potential home support options.  [facility]N CM Short Term Goal #1    Patient will report adequate support at home post surgery in the next 30 days.   [facility] CM Short Term Goal #1 Start Date  04/12/17  Interventions for Short Term Goal #1  CSW coordinating with PCP and Urologist to have supportive services aranged  post surgery.       CSW will contact patient post operatively on 04/23/17.     Eduard Clos, MSW, [place] Worker  [place] [phone]

## 2017-04-19 NOTE — Pre-Procedure Instructions (Signed)
Dr. Royetta Car made aware of Hgb 9.0 on 04/19/2017 he is ok with the patient proceeding with the procedure.  Results faxed to Dr. Louis Meckel via epic I also left a voicemail with Pam to make sure they were aware of the lab result.

## 2017-04-20 ENCOUNTER — Other Ambulatory Visit: Payer: Self-pay | Admitting: *Deleted

## 2017-04-20 ENCOUNTER — Encounter (HOSPITAL_COMMUNITY): Payer: Self-pay

## 2017-04-20 LAB — URINE CULTURE: Culture: NO GROWTH

## 2017-04-20 NOTE — Patient Outreach (Signed)
[facility]) Care Management  04/20/2017  Kristen Macias [birth year] [id number]   RN Health Coach telephone call to patient.  Hipaa compliance verified. Per patient she stated this was not a good time and if I could call back. Plan: RN will call patient again within 10 business days.   [place] Care Management [phone]

## 2017-04-20 NOTE — Patient Outreach (Signed)
[place] [facility]) Care Management  [address]  04/20/2017   Kristen Macias October 11, 1933 [id number]  Subjective: Patient reports heading to get blood work drawn.  Objective: [facility] CSW to assist patient and family with community based resources to aide in their well-being, quality of life and overall safety and needs.    Encounter Medications:  Outpatient Encounter Medications as of 04/19/2017  Medication Sig  . alendronate (FOSAMAX) 70 MG tablet Take 1 tablet (70 mg total) by mouth once a week. Take with a full glass of water on an empty stomach. (Patient taking differently: Take 70 mg by mouth every Sunday. Take with a full glass of water on an empty stomach.)  . atorvastatin (LIPITOR) 40 MG tablet Take 40 mg by mouth daily.  . Calcium Carb-Cholecalciferol (CALCIUM-VITAMIN D) 600-400 MG-UNIT TABS Take 2 tablets by mouth daily. (Patient taking differently: Take 0.5 tablets by mouth 2 (two) times daily. )  . ELIQUIS 5 MG TABS tablet TAKE 1 TABLET TWICE DAILY (Patient taking differently: TAKE 1 TABLET (5 MG) TWICE DAILY)  . metoprolol tartrate (LOPRESSOR) 25 MG tablet TAKE 2 TABLETS EVERY DAY (Patient taking differently: TAKE ONE TABLET (25 MG) BY MOUTH TWICE DAILY)  . Multiple Vitamin (MULTIVITAMIN WITH MINERALS) TABS tablet Take 1 tablet by mouth daily. Centrum Silver  . omeprazole (PRILOSEC) 20 MG capsule TAKE 1 CAPSULE TWICE DAILY  BEFORE  A  MEAL (Patient taking differently: TAKE 1 CAPSULE (20 MG) DAILY BEFORE BREAKFAST, MAY TAKE AN ADDITIONAL DOSE AS NEEDED FOR HEARTBURN OR INDIGESTION.)  . polyethylene glycol powder (GLYCOLAX/MIRALAX) powder Take 17 g by mouth daily. (Patient taking differently: Take 17 g by mouth daily. Mix in coffee and drink)  . tiotropium (SPIRIVA HANDIHALER) 18 MCG inhalation capsule INHALE THE CONTENTS OF 1 CAPSULE EVERY DAY (Patient taking differently: Place 18 mcg into inhaler and inhale daily. INHALE THE CONTENTS OF 1 CAPSULE EVERY DAY)  . VENTOLIN  HFA 108 (90 Base) MCG/ACT inhaler INHALE 1 TO 2 PUFFS INTO THE LUNGS EVERY 6 (SIX) HOURS AS NEEDED FOR WHEEZING OR SHORTNESS OF BREATH.  . [DISCONTINUED] docusate sodium (COLACE) 100 MG capsule Take 1 capsule (100 mg total) by mouth 2 (two) times daily. (Patient not taking: Reported on 03/15/2017)  . [DISCONTINUED] ferrous sulfate (FERROUSUL) 325 (65 FE) MG tablet Take 1 tablet (325 mg total) by mouth 2 (two) times daily with a meal. (Patient not taking: Reported on 03/15/2017)  . [DISCONTINUED] ranitidine (ZANTAC) 150 MG capsule TAKE 1 CAPSULE TWICE DAILY FOR ACID REFLUX (Patient not taking: Reported on 04/08/2017)   No facility-administered encounter medications on file as of 04/19/2017.     Functional Status:  In your present state of health, do you have any difficulty performing the following activities: 04/19/2017 04/12/2017  Hearing? N N  Vision? N Y  Difficulty concentrating or making decisions? N N  Walking or climbing stairs? N N  Dressing or bathing? N N  Doing errands, shopping? N N  Preparing Food and eating ? - N  Using the Toilet? - N  In the past six months, have you accidently leaked urine? - N  Do you have problems with loss of bowel control? - N  Managing your Medications? - N  Managing your Finances? - N  Housekeeping or managing your Housekeeping? - N  Some recent data might be hidden    Fall/Depression Screening: Fall Risk  04/12/2017 04/06/2017 03/15/2017  Falls in the past year? No No Yes  Number falls in past  yr: - - -  Injury with Fall? - - No  Risk for fall due to : History of fall(s) - -   PHQ 2/9 Scores 04/12/2017 04/06/2017 03/15/2017 12/25/2016 11/10/2016 11/02/2016 09/09/2016  PHQ - 2 Score 1 0 0 0 0 0 0    Assessment:  CSW spoke with patient by phone for follow up in regards to visit and possible need for support/services at home.  She continues to decline/deny need for in home personal care assistance aside from "catheter care".  CSW reminded her this will need  to be arranged by MD/hospital at time of procedure.  She does not feel she needs any further support.   Plan:  [facility] CM Care Plan Problem One     Most Recent Value  Care Plan Problem One  Patient having surgery and wants help at home.  Role Documenting the Problem One  Clinical Social Worker  Care Plan for Problem One  Active  [facility] Long Term Goal   Patient will report adequate in home support at home next 31 days.   [facility] Long Term Goal Start Date  04/19/17  Interventions for Problem One Long Term Goal  CSW assisting with potential home support options.  [facility]N CM Short Term Goal #1    Patient will report adequate support at home post surgery in the next 30 days.   [facility] CM Short Term Goal #1 Start Date  04/12/17  Interventions for Short Term Goal #1  CSW coordinating with PCP and Urologist to have supportive services aranged  post surgery.       CSW will plan f/u call post bladder surgery later this week.   Eduard Clos, MSW, [place] Worker  Kristen Macias [phone]

## 2017-04-21 ENCOUNTER — Other Ambulatory Visit: Payer: Self-pay | Admitting: *Deleted

## 2017-04-21 NOTE — Pre-Procedure Instructions (Signed)
Informed Ms. Cloninger of surgery time change she is aware to arrive at 10:15 AM No food after midnight, clears liquids until 6am, no gum, candy, or mints. She verbalized understanding.

## 2017-04-21 NOTE — Patient Outreach (Signed)
[facility]) Care Management  04/21/2017   Kristen Macias Mar 01, 1933 [id number]  RN Health Coach telephone call to patient.  Hipaa compliance verified. Per patient she went out today to do some grocery shopping. Patient used her rescue inhale because it was cold and damp and she didn't want to get sick. Patient is using her Spiriva as per ordered. Per patient she did not know what the zones are. Patient stated she had not heard of them. RN discussed what the zones are. Patient is in green zone not having any distress at this time. Per patient she is able to do all her own care and clean her house at this time. Per patient her daughter lives three houses down from her if she needs anything.  Patient stated she is stressed because she is to have a procedure tomorrow and they said the C word. Patient has agreed to further outreach calls and education.  Current Medications:  Current Outpatient Medications  Medication Sig Dispense Refill  . alendronate (FOSAMAX) 70 MG tablet Take 1 tablet (70 mg total) by mouth once a week. Take with a full glass of water on an empty stomach. (Patient taking differently: Take 70 mg by mouth every Sunday. Take with a full glass of water on an empty stomach.) 24 tablet 0  . atorvastatin (LIPITOR) 40 MG tablet Take 40 mg by mouth daily.    . Calcium Carb-Cholecalciferol (CALCIUM-VITAMIN D) 600-400 MG-UNIT TABS Take 2 tablets by mouth daily. (Patient taking differently: Take 0.5 tablets by mouth 2 (two) times daily. ) 180 tablet 0  . ELIQUIS 5 MG TABS tablet TAKE 1 TABLET TWICE DAILY (Patient taking differently: TAKE 1 TABLET (5 MG) TWICE DAILY) 180 tablet 1  . metoprolol tartrate (LOPRESSOR) 25 MG tablet TAKE 2 TABLETS EVERY DAY (Patient taking differently: TAKE ONE TABLET (25 MG) BY MOUTH TWICE DAILY) 180 tablet 1  . Multiple Vitamin (MULTIVITAMIN WITH MINERALS) TABS tablet Take 1 tablet by mouth daily. Centrum Silver    . omeprazole (PRILOSEC) 20 MG  capsule TAKE 1 CAPSULE TWICE DAILY  BEFORE  A  MEAL (Patient taking differently: TAKE 1 CAPSULE (20 MG) DAILY BEFORE BREAKFAST, MAY TAKE AN ADDITIONAL DOSE AS NEEDED FOR HEARTBURN OR INDIGESTION.) 180 capsule 2  . polyethylene glycol powder (GLYCOLAX/MIRALAX) powder Take 17 g by mouth daily. (Patient taking differently: Take 17 g by mouth daily. Mix in coffee and drink) 3350 g 1  . tiotropium (SPIRIVA HANDIHALER) 18 MCG inhalation capsule INHALE THE CONTENTS OF 1 CAPSULE EVERY DAY (Patient taking differently: Place 18 mcg into inhaler and inhale daily. INHALE THE CONTENTS OF 1 CAPSULE EVERY DAY) 90 capsule 1  . VENTOLIN HFA 108 (90 Base) MCG/ACT inhaler INHALE 1 TO 2 PUFFS INTO THE LUNGS EVERY 6 (SIX) HOURS AS NEEDED FOR WHEEZING OR SHORTNESS OF BREATH. 18 g 2   No current facility-administered medications for this visit.     Functional Status:  In your present state of health, do you have any difficulty performing the following activities: 04/21/2017 04/19/2017  Hearing? N N  Vision? N N  Difficulty concentrating or making decisions? N N  Walking or climbing stairs? N N  Dressing or bathing? N N  Doing errands, shopping? N N  Preparing Food and eating ? N -  Using the Toilet? N -  In the past six months, have you accidently leaked urine? N -  Do you have problems with loss of bowel control? N -  Managing your Medications?  N -  Managing your Finances? N -  Housekeeping or managing your Housekeeping? N -  Some recent data might be hidden    Fall/Depression Screening: Fall Risk  04/21/2017 04/12/2017 04/06/2017  Falls in the past year? No No No  Number falls in past yr: - - -  Injury with Fall? - - -  Risk for fall due to : - History of fall(s) -   PHQ 2/9 Scores 04/21/2017 04/12/2017 04/06/2017 03/15/2017 12/25/2016 11/10/2016 11/02/2016  PHQ - 2 Score 1 1 0 0 0 0 0   THN CM Care Plan Problem One     Most Recent Value  Care Plan Problem One  knowledge deficit in Self management of COPD   Role Documenting the Problem One  [facility] [place] for Problem One  Active  [facility] Long Term Goal   Patient will have have any admissions for COPD within the next 90 days  [facility]N Long Term Goal Start Date  04/21/17  Interventions for Problem One Long Term Goal  RN discussed the zones of COPD and action plan. RN verified inhalers to use.RN sent EMMI educational material on COPD exacerbation.  RN will follow up further teach back and discussions.  [facility]N CM Short Term Goal #1   Patient will be able to verbalize what the yellow zone symptoms are with COPD within the next 30 days  [facility] CM Short Term Goal #1 Start Date  04/21/17  Interventions for Short Term Goal #1  RN discussed the symptoms of the yellow zones of COPD. RN sent patient a Copd packet. RN sent patient a Copd magnet for refrigerator  [facility]N CM Short Term Goal #2   Patient will be able to verbalize what the action plan is for yellow zone within 30 days  [facility]N CM Short Term Goal #2 Start Date  04/21/17  Interventions for Short Term Goal #2  RN discussed what the zones are and the action plan. RN will further discuss the yellow zone action plan. RN will follow up with furthe discussion and teach back.       Assessment:  Patient does not know zones or action plan for COPD Patient is using inhalers as per ordered Patient will benefit from [place] telephonic outreach for education and support for COPD  self management.  Plan:  RN sent 2019 Calendar book RN sent COPD packet RN sent COPD Magnet RN sent EMMI on COPD Exacerbation RN will follow up within the month of March  Eytan Carrigan [place] Management [phone]

## 2017-04-22 ENCOUNTER — Ambulatory Visit (HOSPITAL_COMMUNITY): Payer: Medicare HMO | Admitting: Registered Nurse

## 2017-04-22 ENCOUNTER — Ambulatory Visit (HOSPITAL_COMMUNITY)
Admission: RE | Admit: 2017-04-22 | Discharge: 2017-04-22 | Disposition: A | Discharge status: Home / Self Care | Payer: Medicare HMO | Source: Ambulatory Visit | Attending: Urology | Admitting: Urology

## 2017-04-22 ENCOUNTER — Encounter (HOSPITAL_COMMUNITY)
Admission: RE | Disposition: A | Discharge status: Home / Self Care | Payer: Self-pay | Source: Ambulatory Visit | Attending: Urology

## 2017-04-22 ENCOUNTER — Encounter (HOSPITAL_COMMUNITY): Payer: Self-pay

## 2017-04-22 ENCOUNTER — Ambulatory Visit (HOSPITAL_COMMUNITY): Payer: Medicare HMO

## 2017-04-22 DIAGNOSIS — K219 Gastro-esophageal reflux disease without esophagitis: Secondary | ICD-10-CM | POA: Diagnosis not present

## 2017-04-22 DIAGNOSIS — C671 Malignant neoplasm of dome of bladder: Secondary | ICD-10-CM | POA: Insufficient documentation

## 2017-04-22 DIAGNOSIS — D494 Neoplasm of unspecified behavior of bladder: Secondary | ICD-10-CM | POA: Diagnosis present

## 2017-04-22 DIAGNOSIS — Z7901 Long term (current) use of anticoagulants: Secondary | ICD-10-CM | POA: Insufficient documentation

## 2017-04-22 DIAGNOSIS — I4891 Unspecified atrial fibrillation: Secondary | ICD-10-CM | POA: Diagnosis not present

## 2017-04-22 DIAGNOSIS — E785 Hyperlipidemia, unspecified: Secondary | ICD-10-CM | POA: Diagnosis not present

## 2017-04-22 DIAGNOSIS — Z79899 Other long term (current) drug therapy: Secondary | ICD-10-CM | POA: Diagnosis not present

## 2017-04-22 DIAGNOSIS — D649 Anemia, unspecified: Secondary | ICD-10-CM | POA: Diagnosis not present

## 2017-04-22 DIAGNOSIS — N3289 Other specified disorders of bladder: Secondary | ICD-10-CM | POA: Diagnosis not present

## 2017-04-22 DIAGNOSIS — Z87891 Personal history of nicotine dependence: Secondary | ICD-10-CM | POA: Diagnosis not present

## 2017-04-22 DIAGNOSIS — I739 Peripheral vascular disease, unspecified: Secondary | ICD-10-CM | POA: Diagnosis not present

## 2017-04-22 HISTORY — PX: CYSTOSCOPY W/ RETROGRADES: SHX1426

## 2017-04-22 HISTORY — PX: TRANSURETHRAL RESECTION OF BLADDER TUMOR: SHX2575

## 2017-04-22 SURGERY — TURBT (TRANSURETHRAL RESECTION OF BLADDER TUMOR)
Anesthesia: General

## 2017-04-22 MED ORDER — ONDANSETRON HCL 4 MG/2ML IJ SOLN
INTRAMUSCULAR | Status: DC | PRN
  Administered 2017-04-22: 4 mg via INTRAVENOUS

## 2017-04-22 MED ORDER — FENTANYL CITRATE (PF) 100 MCG/2ML IJ SOLN: 25.0000 ug | INTRAMUSCULAR | Status: DC | PRN

## 2017-04-22 MED ORDER — DEXAMETHASONE SODIUM PHOSPHATE 10 MG/ML IJ SOLN
INTRAMUSCULAR | Status: DC | PRN
  Administered 2017-04-22: 10 mg via INTRAVENOUS

## 2017-04-22 MED ORDER — PHENYLEPHRINE 40 MCG/ML (10ML) SYRINGE FOR IV PUSH (FOR BLOOD PRESSURE SUPPORT)
PREFILLED_SYRINGE | INTRAVENOUS | Status: AC
  Filled 2017-04-22: qty 10

## 2017-04-22 MED ORDER — FENTANYL CITRATE (PF) 100 MCG/2ML IJ SOLN
INTRAMUSCULAR | Status: AC
  Filled 2017-04-22: qty 2

## 2017-04-22 MED ORDER — SODIUM CHLORIDE 0.9 % IR SOLN
Status: DC | PRN
  Administered 2017-04-22: 18000 mL

## 2017-04-22 MED ORDER — FENTANYL CITRATE (PF) 100 MCG/2ML IJ SOLN
INTRAMUSCULAR | Status: DC | PRN
  Administered 2017-04-22 (×6): 25 ug via INTRAVENOUS

## 2017-04-22 MED ORDER — PHENAZOPYRIDINE HCL 200 MG PO TABS: 200.0000 mg | ORAL_TABLET | Freq: Three times a day (TID) | ORAL | 0 refills | Status: DC | PRN

## 2017-04-22 MED ORDER — LABETALOL HCL 5 MG/ML IV SOLN
INTRAVENOUS | Status: AC
  Filled 2017-04-22: qty 4

## 2017-04-22 MED ORDER — OXYCODONE HCL 5 MG PO TABS
5.0000 mg | ORAL_TABLET | Freq: Once | ORAL | Status: AC | PRN
  Administered 2017-04-22: 5 mg via ORAL

## 2017-04-22 MED ORDER — PROPOFOL 10 MG/ML IV BOLUS
INTRAVENOUS | Status: AC
  Filled 2017-04-22: qty 20

## 2017-04-22 MED ORDER — PROPOFOL 10 MG/ML IV BOLUS
INTRAVENOUS | Status: DC | PRN
  Administered 2017-04-22: 140 mg via INTRAVENOUS

## 2017-04-22 MED ORDER — BELLADONNA ALKALOIDS-OPIUM 16.2-60 MG RE SUPP
RECTAL | Status: DC | PRN
  Administered 2017-04-22: 1 via RECTAL

## 2017-04-22 MED ORDER — METOPROLOL TARTRATE 5 MG/5ML IV SOLN
INTRAVENOUS | Status: DC | PRN
  Administered 2017-04-22: 2 mg via INTRAVENOUS
  Administered 2017-04-22: 1 mg via INTRAVENOUS

## 2017-04-22 MED ORDER — PHENYLEPHRINE 40 MCG/ML (10ML) SYRINGE FOR IV PUSH (FOR BLOOD PRESSURE SUPPORT)
PREFILLED_SYRINGE | INTRAVENOUS | Status: DC | PRN
  Administered 2017-04-22: 40 ug via INTRAVENOUS

## 2017-04-22 MED ORDER — 0.9 % SODIUM CHLORIDE (POUR BTL) OPTIME
TOPICAL | Status: DC | PRN
  Administered 2017-04-22: 1000 mL

## 2017-04-22 MED ORDER — TRAMADOL HCL 50 MG PO TABS: 50.0000 mg | ORAL_TABLET | Freq: Four times a day (QID) | ORAL | 0 refills | Status: DC | PRN

## 2017-04-22 MED ORDER — LIDOCAINE 2% (20 MG/ML) 5 ML SYRINGE
INTRAMUSCULAR | Status: DC | PRN
  Administered 2017-04-22: 80 mg via INTRAVENOUS

## 2017-04-22 MED ORDER — DEXAMETHASONE SODIUM PHOSPHATE 10 MG/ML IJ SOLN
INTRAMUSCULAR | Status: AC
  Filled 2017-04-22: qty 1

## 2017-04-22 MED ORDER — LACTATED RINGERS IV SOLN
INTRAVENOUS | Status: DC
  Administered 2017-04-22: 11:00:00 via INTRAVENOUS

## 2017-04-22 MED ORDER — MEPERIDINE HCL 50 MG/ML IJ SOLN: 6.2500 mg | INTRAMUSCULAR | Status: DC | PRN

## 2017-04-22 MED ORDER — OXYCODONE HCL 5 MG/5ML PO SOLN
5.0000 mg | Freq: Once | ORAL | Status: AC | PRN
  Filled 2017-04-22: qty 5

## 2017-04-22 MED ORDER — OXYCODONE HCL 5 MG PO TABS
ORAL_TABLET | ORAL | Status: AC
  Filled 2017-04-22: qty 1

## 2017-04-22 MED ORDER — CEFAZOLIN SODIUM-DEXTROSE 2-4 GM/100ML-% IV SOLN
2.0000 g | INTRAVENOUS | Status: AC
  Administered 2017-04-22: 2 g via INTRAVENOUS
  Filled 2017-04-22: qty 100

## 2017-04-22 MED ORDER — PROMETHAZINE HCL 25 MG/ML IJ SOLN: 6.2500 mg | INTRAMUSCULAR | Status: DC | PRN

## 2017-04-22 MED ORDER — ONDANSETRON HCL 4 MG/2ML IJ SOLN
INTRAMUSCULAR | Status: AC
  Filled 2017-04-22: qty 2

## 2017-04-22 MED ORDER — BELLADONNA-OPIUM 16.2-30 MG RE SUPP
RECTAL | Status: AC
  Filled 2017-04-22: qty 1

## 2017-04-22 SURGICAL SUPPLY — 31 items
BAG URINE DRAINAGE (UROLOGICAL SUPPLIES) ×4 IMPLANT
BAG URO CATCHER STRL LF (MISCELLANEOUS) ×4 IMPLANT
BASKET DAKOTA 1.9FR 11X120 (BASKET) IMPLANT
BASKET ZERO TIP NITINOL 2.4FR (BASKET) IMPLANT
CATH FOLEY 2WAY SLVR  5CC 20FR (CATHETERS) ×2
CATH FOLEY 2WAY SLVR 30CC 24FR (CATHETERS) IMPLANT
CATH FOLEY 2WAY SLVR 5CC 20FR (CATHETERS) ×2 IMPLANT
CATH URET 5FR 28IN OPEN ENDED (CATHETERS) ×4 IMPLANT
CLOTH BEACON ORANGE TIMEOUT ST (SAFETY) ×4 IMPLANT
COVER FOOTSWITCH UNIV (MISCELLANEOUS) IMPLANT
ELECT REM PT RETURN 15FT ADLT (MISCELLANEOUS) ×4 IMPLANT
EVACUATOR MICROVAS BLADDER (UROLOGICAL SUPPLIES) IMPLANT
GLOVE BIOGEL M 8.0 STRL (GLOVE) IMPLANT
GLOVE BIOGEL M STRL SZ7.5 (GLOVE) ×4 IMPLANT
GOWN STRL REUS W/TWL XL LVL3 (GOWN DISPOSABLE) ×8 IMPLANT
GUIDEWIRE ANG ZIPWIRE 038X150 (WIRE) IMPLANT
GUIDEWIRE STR DUAL SENSOR (WIRE) ×4 IMPLANT
LOOP CUT BIPOLAR 24F LRG (ELECTROSURGICAL) ×4 IMPLANT
MANIFOLD NEPTUNE II (INSTRUMENTS) ×4 IMPLANT
NDL SAFETY ECLIPSE 18X1.5 (NEEDLE) ×2 IMPLANT
NEEDLE HYPO 18GX1.5 SHARP (NEEDLE) ×2
PACK CYSTO (CUSTOM PROCEDURE TRAY) ×4 IMPLANT
SET ASPIRATION TUBING (TUBING) IMPLANT
SHEATH ACCESS URETERAL 24CM (SHEATH) IMPLANT
SHEATH ACCESS URETERAL 54CM (SHEATH) IMPLANT
SHEATH URETERAL 12FRX35CM (MISCELLANEOUS) IMPLANT
SYRINGE IRR TOOMEY STRL 70CC (SYRINGE) ×4 IMPLANT
TUBING CONNECTING 10 (TUBING) ×3 IMPLANT
TUBING CONNECTING 10' (TUBING) ×1
TUBING UROLOGY SET (TUBING) ×4 IMPLANT
WIRE COONS/BENSON .038X145CM (WIRE) IMPLANT

## 2017-04-22 NOTE — Anesthesia Postprocedure Evaluation (Signed)
Anesthesia Post Note  Patient: Kristen Macias  Procedure(s) Performed: TRANSURETHRAL RESECTION OF BLADDER TUMOR (TURBT) (N/A ) CYSTOSCOPY WITH BILATERAL  RETROGRADE PYELOGRAM (Bilateral )     Patient location during evaluation: PACU Anesthesia Type: General Level of consciousness: awake and alert Pain management: pain level controlled Vital Signs Assessment: post-procedure vital signs reviewed and stable Respiratory status: spontaneous breathing, nonlabored ventilation, respiratory function stable and patient connected to nasal cannula oxygen Cardiovascular status: blood pressure returned to baseline and stable Postop Assessment: no apparent nausea or vomiting Anesthetic complications: no    Last Vitals:  Vitals:   04/22/17 1415 04/22/17 1430  BP: 109/84   Pulse: 80 85  Resp: (!) 22 12  Temp:    SpO2: 90% 93%    Last Pain:  Vitals:   04/22/17 1415  TempSrc:   PainSc: Gonzales Isam Unrein

## 2017-04-22 NOTE — H&P (Signed)
Bladder Mass  HPI: Kristen Macias is a 84 year-old female patient who is here for further management and evaluation of a bladder mass.  The mass was first noted 3 weeks prior on a CT scan of the pelvis. The mass was discovered by. The patient has noted blood in her urine. She first noted blood in her urine 3 months. does not have a family history of bladder cancer.   The patient has had gross hematuria for the past 3 or 4 months. She is also having urinary urgency and occasional dysuria. Her hemoglobin on initial presentation to the hospital was 6.5. She was transfused and discharged home with scheduled follow-up for urology.     ALLERGIES: None   MEDICATIONS: Metoprolol Tartrate  Omeprazole  Atorvastatin Calcium  Eliquis     GU PSH: None   NON-GU PSH: None   GU PMH: None   NON-GU PMH: GERD    FAMILY HISTORY: None   SOCIAL HISTORY: Marital Status: Single Preferred Language: English; Race: Declined to Specify Current Smoking Status: Patient smokes.   Tobacco Use Assessment Completed: Used Tobacco in last 30 days? Has never drank.  Drinks 3 caffeinated drinks per day.    REVIEW OF SYSTEMS:    GU Review Female:   Patient reports frequent urination, hard to postpone urination, get up at night to urinate, and stream starts and stops. Patient denies burning /pain with urination, leakage of urine, trouble starting your stream, have to strain to urinate, and being pregnant.  Gastrointestinal (Upper):   Patient reports indigestion/ heartburn. Patient denies nausea and vomiting.  Gastrointestinal (Lower):   Patient denies diarrhea and constipation.  Constitutional:   Patient denies fever, night sweats, weight loss, and fatigue.  Skin:   Patient denies skin rash/ lesion and itching.  Eyes:   Patient denies blurred vision and double vision.  Ears/ Nose/ Throat:   Patient denies sore throat and sinus problems.  Hematologic/Lymphatic:   Patient denies swollen glands and easy bruising.   Cardiovascular:   Patient reports leg swelling. Patient denies chest pains.  Respiratory:   Patient denies shortness of breath and cough.  Endocrine:   Patient denies excessive thirst.  Musculoskeletal:   Patient reports joint pain. Patient denies back pain.  Neurological:   Patient denies headaches and dizziness.  Psychologic:   Patient denies depression and anxiety.   VITAL SIGNS:      03/29/2017 02:50 PM  Weight 182 lb / 82.55 kg  Height 63 in / 160.02 cm  BP 126/93 mmHg  Pulse 98 /min  BMI 32.2 kg/m   GU PHYSICAL EXAMINATION:    External Genitalia: No hirsutism, no rash, no scarring, no cyst, no erythematous lesion, no papular lesion, no blanched lesion, no warty lesion. No edema.  Urethral Meatus: Normal size. Normal position. No discharge.  Urethra: No tenderness, no mass, no scarring. No hypermobility. No leakage.  Bladder: Normal to palpation, no tenderness, no mass, normal size.  Vagina: Moderate vaginal atrophy. No stenosis. No rectocele. No cystocele. No enterocele.    MULTI-SYSTEM PHYSICAL EXAMINATION:    Constitutional: Well-nourished. No physical deformities. Normally developed. Good grooming.  Neck: Neck symmetrical, not swollen. Normal tracheal position.  Respiratory: No labored breathing, no use of accessory muscles.   Cardiovascular: Normal temperature, normal extremity pulses, no swelling, no varicosities.  Lymphatic: No enlargement of neck, axillae, groin.  Skin: No paleness, no jaundice, no cyanosis. No lesion, no ulcer, no rash.  Neurologic / Psychiatric: Oriented to time, oriented to place, oriented to  person. No depression, no anxiety, no agitation.  Gastrointestinal: No mass, no tenderness, no rigidity, non obese abdomen.  Eyes: Normal conjunctivae. Normal eyelids.  Ears, Nose, Mouth, and Throat: Left ear no scars, no lesions, no masses. Right ear no scars, no lesions, no masses. Nose no scars, no lesions, no masses. Normal hearing. Normal lips.   Musculoskeletal: Normal gait and station of head and neck.     PAST DATA REVIEWED:  Source Of History:  Patient  Records Review:   Previous Doctor Records, Previous Hospital Records, Previous Patient Records  X-Ray Review: C.T. Abdomen/Pelvis: Reviewed Films.     PROCEDURES:         Flexible Cystoscopy - 52000  Risks, benefits, and some of the potential complications of the procedure were discussed at length with the patient including infection, bleeding, voiding discomfort, urinary retention, fever, chills, sepsis, and others. All questions were answered. Informed consent was obtained. Antibiotic prophylaxis was given. Sterile technique and intraurethral analgesia were used.  Meatus:  Normal size. Normal location. Normal condition.  Urethra:  No hypermobility. No leakage.  Ureteral Orifices:  Normal location. Normal size. Normal shape. Effluxed clear urine.  Bladder:  A dome tumor leftside, 2 1/2 cm tumor. Solitary tumor. No trabeculation. Normal mucosa. No stones.      The lower urinary tract was carefully examined. The procedure was well-tolerated and without complications. Antibiotic instructions were given. Instructions were given to call the office immediately for bloody urine, difficulty urinating, urinary retention, painful or frequent urination, fever, chills, nausea, vomiting or other illness. The patient stated that she understood these instructions and would comply with them.         Urinalysis w/Scope Dipstick Dipstick Cont'd Micro  Color: Yellow Bilirubin: Neg WBC/hpf: 0 - 5/hpf  Appearance: Clear Ketones: Neg RBC/hpf: 40 - 60/hpf  Specific Gravity: 1.025 Blood: 3+ Bacteria: NS (Not Seen)  pH: 6.0 Protein: Trace Cystals: NS (Not Seen)  Glucose: Neg Urobilinogen: 0.2 Casts: NS (Not Seen)    Nitrites: Neg Trichomonas: Not Present    Leukocyte Esterase: Trace Mucous: Not Present      Epithelial Cells: 0 - 5/hpf      Yeast: NS (Not Seen)      Sperm: Not Present     ASSESSMENT:      ICD-10 Details  1 GU:   Bladder Cancer Dome - C67.1    PLAN:           Document Letter(s):  Created for Patient: Clinical Summary         Notes:   The patient has a large broad-based tumor in the left anterior wall of the bladder. This is concerning for high-grade transitional cell carcinoma. I recommended the patient consider transurethral resection of the bladder tumor with bilateral retrograde pyelograms. I discussed the surgery with the patient in significant detail. We'll try to get this scheduled for her as soon as possible. The patient does understand the risks of bladder perforation and needing a Foley catheter for a prolonged period of time.   The patient is on colchicine will need to be cleared prior to surgery

## 2017-04-22 NOTE — Anesthesia Preprocedure Evaluation (Addendum)
Anesthesia Evaluation  Patient identified by MRN, date of birth, ID band Patient awake    Reviewed: Allergy & Precautions, NPO status , Patient's Chart, lab work & pertinent test results, reviewed documented beta blocker date and time   Airway Mallampati: II  TM Distance: >3 FB Neck ROM: Full    Dental no notable dental hx.    Pulmonary pneumonia, COPD, former smoker,    Pulmonary exam normal breath sounds clear to auscultation       Cardiovascular Pt. on home beta blockers + Peripheral Vascular Disease  Normal cardiovascular exam Rhythm:Regular Rate:Normal     Neuro/Psych negative neurological ROS  negative psych ROS   GI/Hepatic Neg liver ROS, hiatal hernia, GERD  ,  Endo/Other  negative endocrine ROS  Renal/GU Renal diseasenegative Renal ROS     Musculoskeletal negative musculoskeletal ROS (+)   Abdominal   Peds  Hematology negative hematology ROS (+) anemia ,   Anesthesia Other Findings   Reproductive/Obstetrics negative OB ROS                            Anesthesia Physical Anesthesia Plan  ASA: III  Anesthesia Plan: General   Post-op Pain Management:    Induction: Intravenous  PONV Risk Score and Plan: 3 and Ondansetron, Dexamethasone and Treatment may vary due to age or medical condition  Airway Management Planned:   Additional Equipment:   Intra-op Plan:   Post-operative Plan: Extubation in OR  Informed Consent: I have reviewed the patients History and Physical, chart, labs and discussed the procedure including the risks, benefits and alternatives for the proposed anesthesia with the patient or authorized representative who has indicated his/her understanding and acceptance.   Dental advisory given  Plan Discussed with: CRNA  Anesthesia Plan Comments:         Anesthesia Quick Evaluation

## 2017-04-22 NOTE — Progress Notes (Signed)
Pt does not need to void before discharge. Per Dr Louis Meckel.

## 2017-04-22 NOTE — Discharge Instructions (Signed)
General Anesthesia, Adult, Care After These instructions provide you with information about caring for yourself after your procedure. Your health care provider may also give you more specific instructions. Your treatment has been planned according to current medical practices, but problems sometimes occur. Call your health care provider if you have any problems or questions after your procedure. What can I expect after the procedure? After the procedure, it is common to have:  Vomiting.  A sore throat.  Mental slowness.  It is common to feel:  Nauseous.  Cold or shivery.  Sleepy.  Tired.  Sore or achy, even in parts of your body where you did not have surgery.  Follow these instructions at home: For at least 24 hours after the procedure:  Do not: ? Participate in activities where you could fall or become injured. ? Drive. ? Use heavy machinery. ? Drink alcohol. ? Take sleeping pills or medicines that cause drowsiness. ? Make important decisions or sign legal documents. ? Take care of children on your own.  Rest. Eating and drinking  If you vomit, drink water, juice, or soup when you can drink without vomiting.  Drink enough fluid to keep your urine clear or pale yellow.  Make sure you have little or no nausea before eating solid foods.  Follow the diet recommended by your health care provider. General instructions  Have a responsible adult stay with you until you are awake and alert.  Return to your normal activities as told by your health care provider. Ask your health care provider what activities are safe for you.  Take over-the-counter and prescription medicines only as told by your health care provider.  If you smoke, do not smoke without supervision.  Keep all follow-up visits as told by your health care provider. This is important. Contact a health care provider if:  You continue to have nausea or vomiting at home, and medicines are not helpful.  You  cannot drink fluids or start eating again.  You cannot urinate after 8-12 hours.  You develop a skin rash.  You have fever.  You have increasing redness at the site of your procedure. Get help right away if:  You have difficulty breathing.  You have chest pain.  You have unexpected bleeding.  You feel that you are having a life-threatening or urgent problem. This information is not intended to replace advice given to you by your health care provider. Make sure you discuss any questions you have with your health care provider. Document Released: 05/18/2000 Document Revised: 07/15/2015 Document Reviewed: 01/24/2015 Elsevier Interactive Patient Education  2018 [employer].      Transurethral Resection of Bladder Tumor (TURBT) or Bladder Biopsy   Definition:  Transurethral Resection of the Bladder Tumor is a surgical procedure used to diagnose and remove tumors within the bladder. TURBT is the most common treatment for early stage bladder cancer.  General instructions:     Your recent bladder surgery requires very little post hospital care but some definite precautions.  Despite the fact that no skin incisions were used, the area around the bladder incisions are raw and covered with scabs to promote healing and prevent bleeding. Certain precautions are needed to insure that the scabs are not disturbed over the next 2-4 weeks while the healing proceeds.  Because the raw surface inside your bladder and the irritating effects of urine you may expect frequency of urination and/or urgency (a stronger desire to urinate) and perhaps even getting up at night more often.  This will usually resolve or improve slowly over the healing period. You may see some blood in your urine over the first 6 weeks. Do not be alarmed, even if the urine was clear for a while. Get off your feet and drink lots of fluids until clearing occurs. If you start to pass clots or don't improve call us.  Diet:  You  may return to your normal diet immediately. Because of the raw surface of your bladder, alcohol, spicy foods, foods high in acid and drinks with caffeine may cause irritation or frequency and should be used in moderation. To keep your urine flowing freely and avoid constipation, drink plenty of fluids during the day (8-10 glasses). Tip: Avoid cranberry juice because it is very acidic.  Activity:  Your physical activity doesn't need to be restricted. However, if you are very active, you may see some blood in the urine. We suggest that you reduce your activity under the circumstances until the bleeding has stopped.  Bowels:  It is important to keep your bowels regular during the postoperative period. Straining with bowel movements can cause bleeding. A bowel movement every other day is reasonable. Use a mild laxative if needed, such as milk of magnesia 2-3 tablespoons, or 2 Dulcolax tablets. Call if you continue to have problems. If you had been taking narcotics for pain, before, during or after your surgery, you may be constipated. Take a laxative if necessary.    Medication:  You should resume your pre-surgery medications unless told not to. In addition you may be given an antibiotic to prevent or treat infection. Antibiotics are not always necessary. All medication should be taken as prescribed until the bottles are finished unless you are having an unusual reaction to one of the drugs.  RESUME ELIQUIS ONCE BLEEDING HAS STOPPED FOR 48 hours.

## 2017-04-22 NOTE — Transfer of Care (Signed)
Immediate Anesthesia Transfer of Care Note  Patient: Kristen Macias  Procedure(s) Performed: TRANSURETHRAL RESECTION OF BLADDER TUMOR (TURBT) (N/A ) CYSTOSCOPY WITH BILATERAL  RETROGRADE PYELOGRAM (Bilateral )  Patient Location: PACU  Anesthesia Type:General  Level of Consciousness: awake, alert , oriented and patient cooperative  Airway & Oxygen Therapy: Patient Spontanous Breathing and Patient connected to face mask oxygen  Post-op Assessment: Report given to RN, Post -op Vital signs reviewed and stable and Patient moving all extremities  Post vital signs: Reviewed and stable  Last Vitals:  Vitals:   04/22/17 1039  BP: 124/79  Pulse: 82  Resp: 16  Temp: 36.8 C  SpO2: 97%    Last Pain:  Vitals:   04/22/17 1054  TempSrc:   PainSc: 8       Patients Stated Pain Goal: 5 (12/09[birth year] 0258)  Complications: No apparent anesthesia complications

## 2017-04-22 NOTE — Anesthesia Procedure Notes (Signed)
Procedure Name: LMA Insertion Date/Time: 04/22/2017 12:27 PM Performed by: Victoriano Lain, CRNA Pre-anesthesia Checklist: Patient identified, Emergency Drugs available, Suction available, Patient being monitored and Timeout performed Patient Re-evaluated:Patient Re-evaluated prior to induction Oxygen Delivery Method: Circle system utilized Preoxygenation: Pre-oxygenation with 100% oxygen Induction Type: IV induction Ventilation: Mask ventilation without difficulty LMA: LMA with gastric port inserted LMA Size: 4.0 Number of attempts: 1 Placement Confirmation: positive ETCO2 and breath sounds checked- equal and bilateral Tube secured with: Tape Dental Injury: Teeth and Oropharynx as per pre-operative assessment

## 2017-04-23 ENCOUNTER — Encounter (HOSPITAL_COMMUNITY): Payer: Self-pay | Admitting: Urology

## 2017-04-23 ENCOUNTER — Other Ambulatory Visit: Payer: Self-pay | Admitting: *Deleted

## 2017-04-23 NOTE — Patient Outreach (Signed)
[facility]) Care Management  [place] 04/23/2017   Kristen Macias Sep 06, 1933 [id number]  Subjective:  "I am home and doing well- did not need a catheter afterall".  Objective: [facility] CSW to assist patient and family with community based resources to aide in their well-being, quality of life and overall safety and needs.    Encounter Medications:  Outpatient Encounter Medications as of 04/23/2017  Medication Sig  . alendronate (FOSAMAX) 70 MG tablet Take 1 tablet (70 mg total) by mouth once a week. Take with a full glass of water on an empty stomach. (Patient taking differently: Take 70 mg by mouth every Sunday. Take with a full glass of water on an empty stomach.)  . atorvastatin (LIPITOR) 40 MG tablet Take 40 mg by mouth daily.  . Calcium Carb-Cholecalciferol (CALCIUM-VITAMIN D) 600-400 MG-UNIT TABS Take 2 tablets by mouth daily. (Patient taking differently: Take 0.5 tablets by mouth 2 (two) times daily. )  . metoprolol tartrate (LOPRESSOR) 25 MG tablet TAKE 2 TABLETS EVERY DAY (Patient taking differently: TAKE ONE TABLET (25 MG) BY MOUTH TWICE DAILY)  . Multiple Vitamin (MULTIVITAMIN WITH MINERALS) TABS tablet Take 1 tablet by mouth daily. Centrum Silver  . omeprazole (PRILOSEC) 20 MG capsule TAKE 1 CAPSULE TWICE DAILY  BEFORE  A  MEAL (Patient taking differently: TAKE 1 CAPSULE (20 MG) DAILY BEFORE BREAKFAST, MAY TAKE AN ADDITIONAL DOSE AS NEEDED FOR HEARTBURN OR INDIGESTION.)  . phenazopyridine (PYRIDIUM) 200 MG tablet Take 1 tablet (200 mg total) by mouth 3 (three) times daily as needed for pain.  . polyethylene glycol powder (GLYCOLAX/MIRALAX) powder Take 17 g by mouth daily. (Patient taking differently: Take 17 g by mouth daily. Mix in coffee and drink)  . tiotropium (SPIRIVA HANDIHALER) 18 MCG inhalation capsule INHALE THE CONTENTS OF 1 CAPSULE EVERY DAY (Patient taking differently: Place 18 mcg into inhaler and inhale daily. INHALE THE CONTENTS OF 1 CAPSULE EVERY  DAY)  . traMADol (ULTRAM) 50 MG tablet Take 1-2 tablets (50-100 mg total) by mouth every 6 (six) hours as needed for moderate pain.  . VENTOLIN HFA 108 (90 Base) MCG/ACT inhaler INHALE 1 TO 2 PUFFS INTO THE LUNGS EVERY 6 (SIX) HOURS AS NEEDED FOR WHEEZING OR SHORTNESS OF BREATH.   No facility-administered encounter medications on file as of 04/23/2017.     Functional Status:  In your present state of health, do you have any difficulty performing the following activities: 04/21/2017 04/19/2017  Hearing? N N  Vision? N N  Difficulty concentrating or making decisions? N N  Walking or climbing stairs? N N  Dressing or bathing? N N  Doing errands, shopping? N N  Preparing Food and eating ? N -  Using the Toilet? N -  In the past six months, have you accidently leaked urine? N -  Do you have problems with loss of bowel control? N -  Managing your Medications? N -  Managing your Finances? N -  Housekeeping or managing your Housekeeping? N -  Some recent data might be hidden    Fall/Depression Screening: Fall Risk  04/21/2017 04/12/2017 04/06/2017  Falls in the past year? No No No  Number falls in past yr: - - -  Injury with Fall? - - -  Risk for fall due to : - History of fall(s) -   PHQ 2/9 Scores 04/21/2017 04/12/2017 04/06/2017 03/15/2017 12/25/2016 11/10/2016 11/02/2016  PHQ - 2 Score 1 1 0 0 0 0 0    Assessment:  [facility] CM Care Plan Problem One     Most Recent Value  Care Plan Problem One  Patient having surgery and wants help at home.  Role Documenting the Problem One  Clinical Social Worker  Care Plan for Problem One  Active  [facility] Long Term Goal   Patient will report adequate in home support at home next 31 days.   [facility] Long Term Goal Start Date  04/19/17  Interventions for Problem One Long Term Goal  CSW assisting with potential home support options.  [facility] CM Short Term Goal #1    Patient will report adequate support at home post surgery in the next 30 days.   [facility] CM Short Term Goal #1  Start Date  04/12/17  [facility] CM Short Term Goal #1 Met Date  04/23/17  Interventions for Short Term Goal #1  CSW coordinating with PCP and Urologist to have supportive services aranged  post surgery.     \  Plan: CSW will plan CSW referral closure.  Will advise [facility] team and PCP. Patient agreeable and has contact # to call if needs arise.   Kristen Macias, MSW, [place] Worker  Kristen Macias [phone]

## 2017-04-23 NOTE — Op Note (Signed)
Preoperative diagnosis:  1. Bladder tumor, 5 cm  Postoperative diagnosis:  1. Same  Procedure: 1. Transurethral resection of bladder tumor, 5 cm  2. Bilateral retrograde pyelogram interpretation  Surgeon: Ardis Hughs, MD  Anesthesia: General  Complications: None  Intraoperative findings:  #1: 10 cc of Omnipaque contrast was through a 5 [place] open-ended ureteral catheter and under fluoroscopic guidance instilled into the right ureteral orifice demonstrated a normal caliber ureter with no filling defects, sharp calyces, and no hydroureteronephrosis. #2: 10 cc of Omnipaque contrast was instilled through a 5 French catheter under fluoroscopic guidance with the patient's left ureteral orifice which demonstrated a normal caliber ureter with no filling defects, sharp calyces, and no hydroureteronephrosis. #3: Bladder tumors on the anterior bladder wall or bladder.  It was broad-based.  #4: Given the size of the tumor, its location, and amount of bleeding throughout the surgery creating poor visualization I suspect this patient will need a staged resection.   EBL: Minimal  Specimens:  Indication: Kristen Macias is a 84 y.o. patient with a bladder tumor on the anterior wall of her bladder as noted on cystoscopy.  After reviewing the management options for treatment, she elected to proceed with the above surgical procedure(s). We have discussed the potential benefits and risks of the procedure, side effects of the proposed treatment, the likelihood of the patient achieving the goals of the procedure, and any potential problems that might occur during the procedure or recuperation. Informed consent has been obtained.  Description of procedure:  The patient was taken to the operating room and general anesthesia was induced.  The patient was placed in the dorsal lithotomy position, prepped and draped in the usual sterile fashion, and preoperative antibiotics were administered. A  preoperative time-out was performed.   21 French 30 degree cystoscope was patient's urethra and bladder under visual guidance.  360 degrees cystoscopic evaluation was performed demonstrating the aforementioned tumor in the findings section, there are no other abnormalities.  Using a 5 [place] open-ended ureteral catheter I performed a right-sided retrograde pyelogram with the above findings.  This was then repeated on the patient's left side.  Findings are above.  I then  can exchange the 21 French cystoscope with a 26 French resectoscope sheath using the visual obturator to advance the patient's urethra visual obturator exchange resection device.  I then systematically resected the patient's bladder tumor anterior bladder wall.  This was exceedingly difficult given the location of the tumor as well as the amount of tumor and associated poor visualization.  Once I was able to achieve some semblance of hemostasis the tumor appeared to be completely resected.  At this point, bladder tumor chips were evacuated using a [place]y syringe.  I then placed a 20 [place] 2-way Foley catheter into the patient's urethra and inflated the balloon with 10 cc of sterile water.  Patient was subsequently extubated and returned to PACU in stable condition.  In the PACU Foley catheter will be removed.  Patient will be discharged home and schedule follow-up in 2 weeks to review the results of the procedure.  Ardis Hughs, M.D.

## 2017-04-27 ENCOUNTER — Other Ambulatory Visit: Payer: Self-pay | Admitting: *Deleted

## 2017-04-28 ENCOUNTER — Other Ambulatory Visit: Payer: Self-pay | Admitting: Internal Medicine

## 2017-04-28 MED ORDER — ALBUTEROL SULFATE HFA 108 (90 BASE) MCG/ACT IN AERS: INHALATION_SPRAY | RESPIRATORY_TRACT | 2 refills | Status: DC

## 2017-05-06 DIAGNOSIS — C673 Malignant neoplasm of anterior wall of bladder: Secondary | ICD-10-CM | POA: Diagnosis not present

## 2017-05-06 DIAGNOSIS — N390 Urinary tract infection, site not specified: Secondary | ICD-10-CM | POA: Diagnosis not present

## 2017-05-07 ENCOUNTER — Other Ambulatory Visit: Payer: Self-pay | Admitting: Urology

## 2017-05-07 DIAGNOSIS — C679 Malignant neoplasm of bladder, unspecified: Secondary | ICD-10-CM

## 2017-05-10 ENCOUNTER — Telehealth: Payer: Self-pay | Admitting: Oncology

## 2017-05-10 ENCOUNTER — Encounter: Payer: Self-pay | Admitting: Oncology

## 2017-05-10 NOTE — Telephone Encounter (Signed)
Appt has been scheduled for the pt to see Dr. Alen Blew on 3/28 at 2pm. Pt aware to arrive 30 minutes early. Letter mailed to the pt. Bethena Roys at [facility] has been notified of the pt's appt date and time.

## 2017-05-11 ENCOUNTER — Other Ambulatory Visit: Payer: Self-pay | Admitting: *Deleted

## 2017-05-11 NOTE — Patient Outreach (Signed)
[facility]) Care Management  05/11/2017   Kristen Macias 04/21/1933 [id number]  RN Health Coach telephone call to patient.  [employer] compliance verified. Per patient she is in the green zone. Patient stated she has not had to use her rescue inhaler. Per patient she has been taking her medications as per ordered. Patient stated that she was having a lot of pain in her lower abdomen and back. She had taken her pain medication an it has not started working. Per patient the pain medication wasn't helping much. RN told patient to call her physician and make him aware. Patient is not seeing any blood in urine after procedure . Per patient she did receive the educational material and glanced through it but not much because of the pain. Patient has agreed to follow up outreach call.  Current Medications:  Current Outpatient Medications  Medication Sig Dispense Refill  . albuterol (VENTOLIN HFA) 108 (90 Base) MCG/ACT inhaler INHALE 1 TO 2 PUFFS INTO THE LUNGS EVERY 6 (SIX) HOURS AS NEEDED FOR WHEEZING OR SHORTNESS OF BREATH. 18 g 2  . alendronate (FOSAMAX) 70 MG tablet Take 1 tablet (70 mg total) by mouth once a week. Take with a full glass of water on an empty stomach. (Patient taking differently: Take 70 mg by mouth every Sunday. Take with a full glass of water on an empty stomach.) 24 tablet 0  . atorvastatin (LIPITOR) 40 MG tablet Take 40 mg by mouth daily.    . Calcium Carb-Cholecalciferol (CALCIUM-VITAMIN D) 600-400 MG-UNIT TABS Take 2 tablets by mouth daily. (Patient taking differently: Take 0.5 tablets by mouth 2 (two) times daily. ) 180 tablet 0  . metoprolol tartrate (LOPRESSOR) 25 MG tablet TAKE 2 TABLETS EVERY DAY (Patient taking differently: TAKE ONE TABLET (25 MG) BY MOUTH TWICE DAILY) 180 tablet 1  . Multiple Vitamin (MULTIVITAMIN WITH MINERALS) TABS tablet Take 1 tablet by mouth daily. Centrum Silver    . omeprazole (PRILOSEC) 20 MG capsule TAKE 1 CAPSULE TWICE DAILY  BEFORE   A  MEAL (Patient taking differently: TAKE 1 CAPSULE (20 MG) DAILY BEFORE BREAKFAST, MAY TAKE AN ADDITIONAL DOSE AS NEEDED FOR HEARTBURN OR INDIGESTION.) 180 capsule 2  . phenazopyridine (PYRIDIUM) 200 MG tablet Take 1 tablet (200 mg total) by mouth 3 (three) times daily as needed for pain. 10 tablet 0  . polyethylene glycol powder (GLYCOLAX/MIRALAX) powder Take 17 g by mouth daily. (Patient taking differently: Take 17 g by mouth daily. Mix in coffee and drink) 3350 g 1  . tiotropium (SPIRIVA HANDIHALER) 18 MCG inhalation capsule INHALE THE CONTENTS OF 1 CAPSULE EVERY DAY (Patient taking differently: Place 18 mcg into inhaler and inhale daily. INHALE THE CONTENTS OF 1 CAPSULE EVERY DAY) 90 capsule 1  . traMADol (ULTRAM) 50 MG tablet Take 1-2 tablets (50-100 mg total) by mouth every 6 (six) hours as needed for moderate pain. 15 tablet 0   No current facility-administered medications for this visit.     Functional Status:  In your present state of health, do you have any difficulty performing the following activities: 05/11/2017 04/21/2017  Hearing? N N  Vision? N N  Difficulty concentrating or making decisions? N N  Walking or climbing stairs? N N  Dressing or bathing? N N  Doing errands, shopping? N N  Preparing Food and eating ? N N  Using the Toilet? N N  In the past six months, have you accidently leaked urine? N N  Do you have problems with  loss of bowel control? N N  Managing your Medications? N N  Managing your Finances? N N  Housekeeping or managing your Housekeeping? N N  Some recent data might be hidden    Fall/Depression Screening: Fall Risk  05/11/2017 04/21/2017 04/12/2017  Falls in the past year? No No No  Number falls in past yr: - - -  Injury with Fall? - - -  Risk for fall due to : - - History of fall(s)   PHQ 2/9 Scores 05/11/2017 04/21/2017 04/12/2017 04/06/2017 03/15/2017 12/25/2016 11/10/2016  PHQ - 2 Score 1 1 1  0 0 0 0   [facility] CM Care Plan Problem One     Most Recent  Value  Care Plan Problem One  knowledge deficit in Self management of COPD  Role Documenting the Problem One  [place] for Problem One  Active  [facility] Long Term Goal   Patient will have have any admissions for COPD within the next 90 days  [facility] Long Term Goal Start Date  04/21/17  Interventions for Problem One Long Term Goal  RN discussed adhering to taking medications, RN discussed going for Dr appoitments. RN discussed zones and action plan to beaware of. RN will follow up with further discussion and teach back  [facility] CM Short Term Goal #1   Patient will be able to verbalize what the yellow zone symptoms are with COPD within the next 30 days  [facility] CM Short Term Goal #1 Start Date  05/11/17  Interventions for Short Term Goal #1  RN confirmed that patient had received the information that Health coach had sent. Per patient she had read some of it but was in a lot of pain and hadn't read it all. Patient was able to state she was in the green zone. RN will follow up with next outreach on zones  [facility] CM Short Term Goal #2   Patient will be able to verbalize what the action plan is for yellow zone within 30 days  [facility] CM Short Term Goal #2 Start Date  05/11/17  Interventions for Short Term Goal #2  RN discussed what zone patient was in today. Per patient she is in the green zone. RN will follow up next outreach for further discussio and teach back.        Assessment:  Patient received the educational information Patient is in the green zone Patient has not had to use her rescue inhaler Patient is in moderate pain  Plan:  RN discussed with patient about calling doctor office to make him aware she is in pain and it is not relieved by her pain medication RN will follow up patient outreach in the month of April  Grace Haggart [place] Management [phone]

## 2017-05-12 ENCOUNTER — Other Ambulatory Visit: Payer: Self-pay | Admitting: Internal Medicine

## 2017-05-13 NOTE — Telephone Encounter (Signed)
Patient scheduled her appt for 05-28-17 with Dr. Brett Albino. Michelyn Scullin,CMA

## 2017-05-13 NOTE — Telephone Encounter (Signed)
I have refilled a month supply of Fosamax. It looks like Dr. Brett Albino wanted to see Kristen Macias for follow up in 3 months for osteoporosis in Sept 2018. I don't see that she made it back for a follow up visit. Please have her schedule with Dr. Brett Albino when she returns.   Phill Myron, D.O. 05/13/2017, 9:54 AM PGY-3, [place]

## 2017-05-17 ENCOUNTER — Encounter: Payer: Self-pay | Admitting: *Deleted

## 2017-05-18 ENCOUNTER — Ambulatory Visit (HOSPITAL_COMMUNITY)
Admission: RE | Admit: 2017-05-18 | Discharge: 2017-05-18 | Disposition: A | Discharge status: Home / Self Care | Payer: Medicare HMO | Source: Ambulatory Visit | Attending: Urology | Admitting: Urology

## 2017-05-18 DIAGNOSIS — Z79899 Other long term (current) drug therapy: Secondary | ICD-10-CM | POA: Insufficient documentation

## 2017-05-18 DIAGNOSIS — C679 Malignant neoplasm of bladder, unspecified: Secondary | ICD-10-CM | POA: Diagnosis not present

## 2017-05-18 LAB — GLUCOSE, CAPILLARY: GLUCOSE-CAPILLARY: 103 mg/dL — AB (ref 65–99)

## 2017-05-18 MED ORDER — FLUDEOXYGLUCOSE F - 18 (FDG) INJECTION
9.2000 | Freq: Once | INTRAVENOUS | Status: AC | PRN
  Administered 2017-05-18: 9.2 via INTRAVENOUS

## 2017-05-20 ENCOUNTER — Inpatient Hospital Stay: Payer: Medicare HMO | Attending: Oncology | Admitting: Oncology

## 2017-05-20 ENCOUNTER — Encounter: Payer: Self-pay | Admitting: Radiation Oncology

## 2017-05-20 VITALS — BP 122/85 | HR 77 | Temp 98.2°F | Resp 17 | Ht 63.0 in | Wt 190.9 lb

## 2017-05-20 DIAGNOSIS — C679 Malignant neoplasm of bladder, unspecified: Secondary | ICD-10-CM

## 2017-05-20 DIAGNOSIS — R109 Unspecified abdominal pain: Secondary | ICD-10-CM | POA: Diagnosis not present

## 2017-05-20 DIAGNOSIS — R319 Hematuria, unspecified: Secondary | ICD-10-CM

## 2017-05-20 DIAGNOSIS — C671 Malignant neoplasm of dome of bladder: Secondary | ICD-10-CM | POA: Diagnosis not present

## 2017-05-20 DIAGNOSIS — D509 Iron deficiency anemia, unspecified: Secondary | ICD-10-CM | POA: Diagnosis not present

## 2017-05-20 DIAGNOSIS — D500 Iron deficiency anemia secondary to blood loss (chronic): Secondary | ICD-10-CM

## 2017-05-20 DIAGNOSIS — M549 Dorsalgia, unspecified: Secondary | ICD-10-CM

## 2017-05-20 MED ORDER — HYDROCODONE-ACETAMINOPHEN 5-325 MG PO TABS: 1.0000 | ORAL_TABLET | Freq: Four times a day (QID) | ORAL | 0 refills | Status: DC | PRN
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
## 2017-05-24 ENCOUNTER — Other Ambulatory Visit: Payer: Self-pay | Admitting: Internal Medicine

## 2017-05-25 ENCOUNTER — Inpatient Hospital Stay: Payer: Medicare HMO | Attending: Oncology

## 2017-05-25 ENCOUNTER — Telehealth: Payer: Self-pay | Admitting: Oncology

## 2017-05-25 DIAGNOSIS — C671 Malignant neoplasm of dome of bladder: Secondary | ICD-10-CM | POA: Insufficient documentation

## 2017-05-25 DIAGNOSIS — D509 Iron deficiency anemia, unspecified: Secondary | ICD-10-CM | POA: Insufficient documentation

## 2017-05-25 NOTE — Telephone Encounter (Signed)
Appointments moved to later in the moring per patient request 4/2

## 2017-05-27 ENCOUNTER — Other Ambulatory Visit: Payer: Self-pay | Admitting: Internal Medicine

## 2017-05-27 DIAGNOSIS — J449 Chronic obstructive pulmonary disease, unspecified: Secondary | ICD-10-CM

## 2017-05-28 ENCOUNTER — Other Ambulatory Visit: Payer: Self-pay

## 2017-05-28 ENCOUNTER — Encounter: Payer: Self-pay | Admitting: Internal Medicine

## 2017-05-28 ENCOUNTER — Encounter: Payer: Self-pay | Admitting: Radiation Oncology

## 2017-05-28 ENCOUNTER — Ambulatory Visit (INDEPENDENT_AMBULATORY_CARE_PROVIDER_SITE_OTHER): Payer: Medicare HMO | Admitting: Internal Medicine

## 2017-05-28 VITALS — BP 102/70 | HR 76 | Temp 97.4°F | Wt 190.0 lb

## 2017-05-28 DIAGNOSIS — R300 Dysuria: Secondary | ICD-10-CM

## 2017-05-28 DIAGNOSIS — M810 Age-related osteoporosis without current pathological fracture: Secondary | ICD-10-CM | POA: Diagnosis not present

## 2017-05-28 DIAGNOSIS — N3001 Acute cystitis with hematuria: Secondary | ICD-10-CM

## 2017-05-28 LAB — POCT URINALYSIS DIP (MANUAL ENTRY)
BILIRUBIN UA: NEGATIVE mg/dL
Bilirubin, UA: NEGATIVE
GLUCOSE UA: NEGATIVE mg/dL
Nitrite, UA: POSITIVE — AB
Protein Ur, POC: 30 mg/dL — AB
SPEC GRAV UA: 1.01 (ref 1.010–1.025)
UROBILINOGEN UA: 0.2 U/dL
pH, UA: 7 (ref 5.0–8.0)

## 2017-05-28 LAB — POCT UA - MICROSCOPIC ONLY

## 2017-05-28 MED ORDER — CEPHALEXIN 500 MG PO CAPS: 500.0000 mg | ORAL_CAPSULE | Freq: Four times a day (QID) | ORAL | 0 refills | Status: DC

## 2017-05-28 MED ORDER — CALCIUM-VITAMIN D 600-400 MG-UNIT PO TABS: 2.0000 | ORAL_TABLET | Freq: Every day | ORAL | 0 refills | Status: DC

## 2017-05-28 NOTE — Patient Instructions (Signed)
It was so nice to see you today!  For your bladder infection- I have prescribed an antibiotic called Keflex. Please take 1 tablet four times a day for 7 days. I have ordered a urine culture because I am concerned that you may grow an unusual organism. I will call you at the beginning of next week with those results.  -Dr. Brett Albino

## 2017-05-28 NOTE — Assessment & Plan Note (Signed)
UA with 2+ leukocytes and positive nitrites. No systemic symptoms to suggest pyelonephritis. - Will treat with Keflex 500mg  qid x 7 days - Urine culture ordered, as patient is at increased risk of growing an unusual organism with her bladder cancer and recent bladder instrumentation - Return precautions discussed - Follow-up if no improvement after antibiotic course

## 2017-05-28 NOTE — Progress Notes (Signed)
GU Location of Tumor / Histology: Poorly differentiated carcinoma consistent with small cell cancer of the bladder diagnosed in February 2019.  He presented with iron deficiency anemia and a bladder tumor on imaging studies.  She is status post TURBT that confirmed the diagnosis.  PET/CT scan in March 2019 showed no evidence of advanced disease.    Past/Anticipated interventions by urology, if any: TURBT  Past/Anticipated interventions by medical oncology, if any: discussed combination of carboplatin and etoposide given on 3 days and consecutively every 3 weeks. Referred to radiation oncology. Scheduled for chemo on 06/14/2017  Weight changes, if any: No  Bowel/Bladder complaints, if any: Reports she stopped Eliquis on 05/20/2017 as directed by Dr. Alen Blew. Reports hematuria resolved when she stopped the Eliquis. Patient reports at night when she gets up to void then lays back down her heart is racing. Reported this finding to Shona Simpson, PA-C. Reports nocturia x 2-3. Reports urinary urgency in the mornings. Reports intermittent urine stream. Denies hesitancy.  Nausea/Vomiting, if any: Reports nausea. Denies vomiting. No antiemetics have been prescribed to manage nausea.   Pain issues, if any:  Abdominal and back pain 7 on a scale of 0-10 related to effects of bladder tumor. Reports taking tramadol and hydrocodone acetaminophen to manage pain but is out of both and needs a refill.   SAFETY ISSUES:  Prior radiation? no  Pacemaker/ICD? no  Possible current pregnancy? no  Is the patient on methotrexate? no  Current Complaints / other details:  84 year old female. Widowed. Patient is one of nine children. Patient accompanied by her only living sibling her sister. Reports living independently but that her sister drives her around. Resides on [address].

## 2017-05-28 NOTE — Assessment & Plan Note (Signed)
Doing well. No recent falls. - Continue Alendronate 70mg  weekly - Refilled Calcium-Vitamin D

## 2017-05-28 NOTE — Progress Notes (Signed)
   [facility] Phone: [phone]  Subjective:  Kristen Macias is an 84 year old female presenting to clinic with dysuria and suprapubic pain for the last couple of weeks. She notes that the suprapubic pain radiates to her low back. She endorses urinary urgency and frequency. No fevers, no chills. Of note, she has recently diagnosed bladder cancer. She underwent bilateral cystoscopy with bilateral retrograde pyelogram and transurethral resection of her bladder tumor on 04/22/17. She will be starting radiation at the end of the month with plans to do chemotherapy after that.  She has a history of osteoporosis. Taking Alendronate 70mg  weekly and calcium-vitamin D daily. No side effects. No severe back pain. No recent falls.  ROS: See HPI for pertinent positives and negatives  Past Medical History- atrial fibrillation, COPD exacerbation, osteoporosis, HLD, prediabetes  Family history reviewed for today's visit. No changes.  Social history- patient is a former smoker  Objective: BP 102/70   Pulse 76   Temp (!) 97.4 F (36.3 C) (Oral)   Wt 190 lb (86.2 kg)   SpO2 99%   BMI 33.66 kg/m  Gen: NAD, alert, cooperative with exam Back: No CVA tenderness GI: +BS, soft, +suprapubic tenderness, non-distended, no rebound, no guarding  Assessment/Plan: UTI: UA with 2+ leukocytes and positive nitrites. No systemic symptoms to suggest pyelonephritis. - Will treat with Keflex 500mg  qid x 7 days - Urine culture ordered, as patient is at increased risk of growing an unusual organism with her bladder cancer and recent bladder instrumentation - Return precautions discussed - Follow-up if no improvement after antibiotic course  Osteoporosis: Doing well. No recent falls. - Continue Alendronate 70mg  weekly - Refilled Calcium-Vitamin D   Hyman Bible, MD PGY-3

## 2017-05-30 LAB — URINE CULTURE

## 2017-05-31 ENCOUNTER — Ambulatory Visit
Admission: RE | Admit: 2017-05-31 | Discharge: 2017-05-31 | Disposition: A | Discharge status: Home / Self Care | Payer: Medicare HMO | Source: Ambulatory Visit | Attending: Radiation Oncology | Admitting: Radiation Oncology

## 2017-05-31 ENCOUNTER — Other Ambulatory Visit: Payer: Self-pay

## 2017-05-31 ENCOUNTER — Other Ambulatory Visit: Payer: Self-pay | Admitting: Radiation Oncology

## 2017-05-31 ENCOUNTER — Encounter: Payer: Self-pay | Admitting: Radiation Oncology

## 2017-05-31 VITALS — BP 107/68 | HR 76 | Temp 98.2°F | Resp 18 | Ht 63.0 in | Wt 190.8 lb

## 2017-05-31 DIAGNOSIS — C670 Malignant neoplasm of trigone of bladder: Secondary | ICD-10-CM

## 2017-05-31 DIAGNOSIS — C679 Malignant neoplasm of bladder, unspecified: Secondary | ICD-10-CM

## 2017-05-31 DIAGNOSIS — D649 Anemia, unspecified: Secondary | ICD-10-CM

## 2017-05-31 DIAGNOSIS — R102 Pelvic and perineal pain: Secondary | ICD-10-CM | POA: Diagnosis not present

## 2017-05-31 DIAGNOSIS — D500 Iron deficiency anemia secondary to blood loss (chronic): Secondary | ICD-10-CM | POA: Diagnosis not present

## 2017-05-31 DIAGNOSIS — Z510 Encounter for antineoplastic radiation therapy: Secondary | ICD-10-CM | POA: Diagnosis not present

## 2017-05-31 DIAGNOSIS — G893 Neoplasm related pain (acute) (chronic): Secondary | ICD-10-CM | POA: Insufficient documentation

## 2017-05-31 DIAGNOSIS — Z87891 Personal history of nicotine dependence: Secondary | ICD-10-CM | POA: Diagnosis not present

## 2017-05-31 DIAGNOSIS — C673 Malignant neoplasm of anterior wall of bladder: Secondary | ICD-10-CM

## 2017-05-31 DIAGNOSIS — C672 Malignant neoplasm of lateral wall of bladder: Secondary | ICD-10-CM | POA: Diagnosis not present

## 2017-05-31 DIAGNOSIS — N3001 Acute cystitis with hematuria: Secondary | ICD-10-CM

## 2017-05-31 HISTORY — DX: Malignant neoplasm of bladder, unspecified: C67.9

## 2017-05-31 LAB — CBC (CANCER CENTER ONLY)
HCT: 29.9 % — ABNORMAL LOW (ref 34.8–46.6)
Hemoglobin: 8.7 g/dL — ABNORMAL LOW (ref 11.6–15.9)
MCH: 20.9 pg — ABNORMAL LOW (ref 25.1–34.0)
MCHC: 29.1 g/dL — ABNORMAL LOW (ref 31.5–36.0)
MCV: 71.9 fL — ABNORMAL LOW (ref 79.5–101.0)
Platelet Count: 347 K/uL (ref 145–400)
RBC: 4.16 MIL/uL (ref 3.70–5.45)
RDW: 19.5 % — ABNORMAL HIGH (ref 11.2–14.5)
WBC Count: 11.5 K/uL — ABNORMAL HIGH (ref 3.9–10.3)

## 2017-05-31 MED ORDER — HYDROCODONE-ACETAMINOPHEN 5-325 MG PO TABS: 1.0000 | ORAL_TABLET | Freq: Four times a day (QID) | ORAL | 0 refills | Status: DC | PRN

## 2017-05-31 NOTE — Progress Notes (Addendum)
Radiation Oncology         [phone]) [phone] ________________________________  Initial Outpatient Consultation  Name: Kristen Macias MRN: [id number]  Date of Service: 05/31/2017 DOB: 1933/12/11  JH:ERDE, Pete Pelt, MD  Wyatt Portela, MD   REFERRING PHYSICIAN: Wyatt Portela, MD  DIAGNOSIS: 84 yo woman with localized small cell carcinoma of the bladder  HISTORY OF PRESENT ILLNESS: Kristen Macias is a 84 y.o. female seen at the request of Dr. 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  The patient has a history of atrial flutter and has been on anticoagulation with Eliquis, and developed symptomatic anemia with a hemoglobin of 5.4.  A workup ensued and revealed a mass in the anterior wall of the bladder.  She underwent TURBT on 2/29/2019 revealing a large tumor in the right bladder wall.  Final pathology revealed an invasive poorly differentiated carcinoma consistent with small cell.  This was noted to invade the muscularis, and she underwent staging PET CT scan on 05/18/2017 that did not reveal any evidence of metastatic disease.  Although she is a candidate for chemotherapy followed by cystectomy and possible radiotherapy, she has declined cystectomy as an option, and she is planning to begin carboplatin and etoposide on April 22.  She comes today however to discuss the options of palliative radiotherapy.  PREVIOUS RADIATION THERAPY: No  PAST MEDICAL HISTORY:  Past Medical History:  Diagnosis Date  . Anemia   . Aortic atherosclerosis ([place])   . Atrial fibrillation ([place]) 05/19/2016  . Bilateral cataracts   . Bladder cancer ([place])   . Cholelithiasis   . Colon, diverticulosis   . Compression fracture of body of thoracic vertebra ([facility]) 03/08/2015   T7  . COPD (chronic obstructive pulmonary disease) ([place])   . GERD (gastroesophageal reflux disease)   . History of blood transfusion 03/16/2017  . History of DVT (deep vein thrombosis)   . History of hiatal hernia   .  Hyperlipidemia   . Kidney infection 04/12/2017  . Mild cardiomegaly 04/19/2017   Noted on CXR  . Osteoporosis   . Pneumonia April 2012  . Rotator cuff tear, left       PAST SURGICAL HISTORY: Past Surgical History:  Procedure Laterality Date  . APPENDECTOMY    . Brother cut off fingers with axe as child     Accidental  . COLONOSCOPY    . CYSTOSCOPY W/ RETROGRADES Bilateral 04/22/2017   Procedure: CYSTOSCOPY WITH BILATERAL  RETROGRADE PYELOGRAM;  Surgeon: Ardis Hughs, MD;  Location: [facility];  Service: Urology;  Laterality: Bilateral;  . Rotator cuff  2001  . TONSILLECTOMY    . TOTAL ABDOMINAL HYSTERECTOMY W/ BILATERAL SALPINGOOPHORECTOMY  Long time ago  . TRANSURETHRAL RESECTION OF BLADDER TUMOR N/A 04/22/2017   Procedure: TRANSURETHRAL RESECTION OF BLADDER TUMOR (TURBT);  Surgeon: Ardis Hughs, MD;  Location: [facility];  Service: Urology;  Laterality: N/A;    FAMILY HISTORY:  Family History  Problem Relation Age of Onset  . Alzheimer's disease Mother   . Heart disease Mother        Passed 35 yo  . Heart disease Father        Passed 54 yo  . Heart disease Brother   . Diabetes Brother        A couple of her brothers  . Lung cancer Brother   . Brain cancer Brother   . Heart disease Brother     SOCIAL HISTORY:  Social History  Socioeconomic History  . Marital status: Widowed    Spouse name: Not on file  . Number of children: Not on file  . Years of education: Not on file  . Highest education level: Not on file  Occupational History  . Not on file  Social Needs  . Financial resource strain: Not on file  . Food insecurity:    Worry: Not on file    Inability: Not on file  . Transportation needs:    Medical: Not on file    Non-medical: Not on file  Tobacco Use  . Smoking status: Former Smoker    Packs/day: 0.30    Types: Cigarettes    Start date: 01/12/1952    Last attempt to quit: 05/25/2010    Years since quitting: 7.0  . Smokeless tobacco: Never  Used  . Tobacco comment: Started smoking at age 70. Quit for 10 years, started back and smoked for 5 years. Has now been quit for ~6-7 years.  Substance and Sexual Activity  . Alcohol use: No  . Drug use: No  . Sexual activity: Never  Lifestyle  . Physical activity:    Days per week: Not on file    Minutes per session: Not on file  . Stress: Not on file  Relationships  . Social connections:    Talks on phone: Not on file    Gets together: Not on file    Attends religious service: Not on file    Active member of club or organization: Not on file    Attends meetings of clubs or organizations: Not on file    Relationship status: Not on file  . Intimate partner violence:    Fear of current or ex partner: Not on file    Emotionally abused: Not on file    Physically abused: Not on file    Forced sexual activity: Not on file  Other Topics Concern  . 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 Married 3 times (last husband passed). 1 daughter lives in [place] nearby.    She had 5 brothers (all dead) and sister (alive).     ALLERGIES: Patient has no known allergies.  MEDICATIONS:  Current Outpatient Medications  Medication Sig Dispense Refill  . albuterol (VENTOLIN HFA) 108 (90 Base) MCG/ACT inhaler INHALE 1 TO 2 PUFFS INTO THE LUNGS EVERY 6 (SIX) HOURS AS NEEDED FOR WHEEZING OR SHORTNESS OF BREATH. 18 g 2  . alendronate (FOSAMAX) 70 MG tablet TAKE 1 TABLET ONE TIME A WEEK WITH A FULL GLASS OF WATER ON AN EMPTY STOMACH. 4 tablet 0  . atorvastatin (LIPITOR) 40 MG tablet Take 40 mg by mouth daily.    . Calcium Carb-Cholecalciferol (CALCIUM-VITAMIN D) 600-400 MG-UNIT TABS Take 2 tablets by mouth daily. 180 tablet 0  . metoprolol tartrate (LOPRESSOR) 25 MG tablet TAKE 2 TABLETS EVERY DAY (Patient taking differently: TAKE ONE  TABLET (25 MG) BY MOUTH TWICE DAILY) 180 tablet 1  . omeprazole (PRILOSEC) 20 MG capsule TAKE 1 CAPSULE TWICE DAILY  BEFORE  A  MEAL (Patient taking differently: TAKE 1 CAPSULE (20 MG) DAILY BEFORE BREAKFAST, MAY TAKE AN ADDITIONAL DOSE AS NEEDED FOR HEARTBURN OR INDIGESTION.) 180 capsule 2  . tiotropium (SPIRIVA HANDIHALER) 18 MCG inhalation capsule INHALE THE CONTENTS OF 1 CAPSULE EVERY DAY 90 capsule 1  . cephALEXin (  KEFLEX) 500 MG capsule Take 1 capsule (500 mg total) by mouth 4 (four) times daily. (Patient not taking: Reported on 05/31/2017) 28 capsule 0  . HYDROcodone-acetaminophen (NORCO) 5-325 MG tablet Take 1-2 tablets by mouth every 6 (six) hours as needed for moderate pain. 90 tablet 0  . Multiple Vitamin (MULTIVITAMIN WITH MINERALS) TABS tablet Take 1 tablet by mouth daily. Centrum Silver    . phenazopyridine (PYRIDIUM) 200 MG tablet Take 1 tablet (200 mg total) by mouth 3 (three) times daily as needed for pain. (Patient not taking: Reported on 05/31/2017) 10 tablet 0  . polyethylene glycol powder (GLYCOLAX/MIRALAX) powder Take 17 g by mouth daily. (Patient not taking: Reported on 05/31/2017) 3350 g 1  . ranitidine (ZANTAC) 150 MG capsule TAKE 1 CAPSULE TWICE DAILY FOR ACID REFLUX (Patient not taking: Reported on 05/31/2017) 180 capsule 0  . traMADol (ULTRAM) 50 MG tablet Take 1-2 tablets (50-100 mg total) by mouth every 6 (six) hours as needed for moderate pain. (Patient not taking: Reported on 05/31/2017) 15 tablet 0   No current facility-administered medications for this encounter.     REVIEW OF SYSTEMS:  On review of systems, the patient reports that she is doing well overall. She denies any chest pain, shortness of breath, cough, fevers, chills, night sweats, or unintended weight changes. She denies any bowel disturbances, and denies nausea or vomiting. She reports resolved hematuria since stopping Eliquis. She reports abdominal and back pain and is taking Norco which she states has helped. A  complete review of systems is obtained and is otherwise negative.    PHYSICAL EXAM:  Wt Readings from Last 3 Encounters:  05/31/17 190 lb 12.8 oz (86.5 kg)  05/28/17 190 lb (86.2 kg)  05/20/17 190 lb 14.4 oz (86.6 kg)   Temp Readings from Last 3 Encounters:  05/31/17 98.2 F (36.8 C) (Oral)  05/28/17 (!) 97.4 F (36.3 C) (Oral)  05/20/17 98.2 F (36.8 C) (Oral)   BP Readings from Last 3 Encounters:  05/31/17 107/68  05/28/17 102/70  05/20/17 122/85   Pulse Readings from Last 3 Encounters:  05/31/17 76  05/28/17 76  05/20/17 77   Pain Assessment Pain Score: 7 /10  In general this is a well appearing caucasian female in no acute distress. She's alert and oriented x4 and appropriate throughout the examination. Cardiopulmonary assessment is negative for acute distress and she exhibits normal effort.     KPS = 90  100 - Normal; no complaints; no evidence of disease. 90   - Able to carry on normal activity; minor signs or symptoms of disease. 80   - Normal activity with effort; some signs or symptoms of disease. 86   - Cares for self; unable to carry on normal activity or to do active work. 60   - Requires occasional assistance, but is able to care for most of his personal needs. 50   - Requires considerable assistance and frequent medical care. 11   - Disabled; requires special care and assistance. 6   - Severely disabled; hospital admission is indicated although death not imminent. 75   - Very sick; hospital admission necessary; active supportive treatment necessary. 10   - Moribund; fatal processes progressing rapidly. 0     - Dead  Karnofsky DA, Abelmann WH, Craver LS and Burchenal [facility] [phone]) The use of the nitrogen mustards in the palliative treatment of carcinoma: with particular reference to bronchogenic carcinoma Cancer 1 634-56  LABORATORY DATA:  Lab Results  Component Value  Date   WBC 10.2 04/19/2017   HGB 9.0 (L) 04/19/2017   HCT 30.5 (L) 04/19/2017   MCV  72.4 (L) 04/19/2017   PLT 328 04/19/2017   Lab Results  Component Value Date   NA 139 04/19/2017   K 5.1 04/19/2017   CL 107 04/19/2017   CO2 23 04/19/2017   Lab Results  Component Value Date   ALT 13 (L) 03/15/2017   AST 20 03/15/2017   ALKPHOS 111 03/15/2017   BILITOT 0.7 03/15/2017     RADIOGRAPHY: Nm Pet Image Initial (pi) Skull Base To Thigh  Result Date: 05/18/2017 CLINICAL DATA:  Initial treatment strategy for staging of bladder cancer. EXAM: NUCLEAR MEDICINE PET SKULL BASE TO THIGH TECHNIQUE: 9.2 mCi F-18 FDG was injected intravenously. Full-ring PET imaging was performed from the skull base to thigh after the radiotracer. CT data was obtained and used for attenuation correction and anatomic localization. Fasting blood glucose: 103 mg/dl COMPARISON:  03/15/2017 abdominopelvic CT. No prior PET. Chest CT 12/08/2011 reviewed. FINDINGS: Mediastinal blood pool activity: SUV max 2.6 NECK: Hypermetabolism is identified about the left paramidline soft tissues superficial to the mandible. Equivocal cortical thickening or sclerotic lesion in this area on image 37/4. This measures a S.U.V. max of 8.9 No hypermetabolic cervical nodes. Incidental CT findings: No cervical adenopathy. CHEST: No pulmonary parenchymal or thoracic nodal hypermetabolism. Incidental CT findings: Mild cardiomegaly. Upper normal ascending aortic size at 3.9 cm. Proximal LAD coronary artery atherosclerosis. no thoracic adenopathy. Moderate hiatal hernia. ABDOMEN/PELVIS: No abdominopelvic parenchymal or nodal hypermetabolism. left anterior bladder lesion, suboptimally evaluated due to bladder underdistention. 3.4 x 2.4 cm on image 169/4. Presumably obscured on PET by bladder/urine hypermetabolism. Incidental CT findings: Mild renal cortical thinning bilaterally. Multiple dependent gallstones. Abdominal aortic atherosclerosis. Extensive colonic diverticulosis. Hysterectomy. SKELETON: Hypermetabolism about both greater trochanters  is likely due to bursitis. Incidental CT findings: Mild osteopenia. Thoracic and upper lumbar compression deformities are suboptimally evaluated. IMPRESSION: 1. Bladder lesion is likely enlarged compared to 03/15/2017. Not readily apparent at PET, presumably obscured by urinary hypermetabolism. 2. No hypermetabolic metastatic disease identified. 3. Hypermetabolism about the soft tissues of the left paramidline chin. Equivocal adjacent osseous cortical thickening or sclerosis. Correlate with physical exam. If no soft tissue lesion or dental infection in this area, consider dedicated facial MRI to evaluate the mandible and adjacent soft tissues. 4. Incidental findings, including cholelithiasis, hiatal hernia, coronary artery atherosclerosis. Electronically Signed   By: Abigail Miyamoto M.D.   On: 05/18/2017 13:08      IMPRESSION/PLAN: 1. 84 y.o. female with locally confined small cell carcinoma to the urinary bladder along the anterior wall. Today, we met with the patient and her sister about the findings and work-up thus far.  We discussed the natural history of small cell bladder cancer and general treatment, highlighting the role of radiotherapy in the management.  We discussed the available radiation techniques, and focused on the details of logistics and delivery.  We reviewed the anticipated acute and late sequelae associated with radiation in this setting.  The patient was encouraged to ask questions that we answered to the best of our ability. At the completion of the visit, we recommend conformal radiotherapy over 5-6 weeks following completion of chemotherapy, if hgb stable.  If she develops recurrent hematuria or pelvic pain, then consider a course of 2 weeks to palliate her symptoms   2. Chronic blood loss anemia secondary to #1. The patient reports that her hematuria resolved after stopping Eliquis.  Today,  we will recheck a stat CBC.  If Hgb is stable, the patient would like to proceed with chemo alone.   If the CBC is OK, the patient will begin chemotherapy on 06/14/2017. She will begin radiation treatment following the completion of chemotherapy.  3. Pelvic pain. A new prescription for Norco was electronically prescribed to the patient's pharmacy. She will continue this with Dr. Alen Blew.  In a visit lasting 60 minutes, greater than 50% of the time was spent face to face discussing her case, and coordinating the patient's care.    Carola Rhine, PAC and  Sheral Apley Tammi Klippel, M.D.  This document serves as a record of services personally performed by Tyler Pita, MD and Shona Simpson, PA-C. It was created on their behalf by Rae Lips, a trained medical scribe. The creation of this record is based on the scribe's personal observations and the providers' statements to them. This document has been checked and approved by the attending providers.    Addendum:  Hgb is relatively stable at 8.7, so, plan chemo first.

## 2017-05-31 NOTE — Progress Notes (Signed)
See progress note under physician encounter. 

## 2017-06-07 ENCOUNTER — Other Ambulatory Visit: Payer: Self-pay | Admitting: Internal Medicine

## 2017-06-07 ENCOUNTER — Other Ambulatory Visit: Payer: Self-pay | Admitting: *Deleted

## 2017-06-08 NOTE — Patient Outreach (Signed)
[facility]) Care Management  06/08/2017  Kristen Macias [birth year] [id number]  06/07/2017 RN Health Coach telephone call to patient.  [employer] compliance verified. Patient having phone problems. RN called back x 2 same problems.  06/08/2017.Patient had called CMA and left another number for [facility] to call her back on. 06/08/2017 RN Health Coach telephone call to patient.  Hipaa compliance verified. Per patient she is in so much pain. Patient is taking Vicodin and started taking 2 tabs instead on 1. Per patient she took another dose 2 1/2 hours later and her pain is a 12 on scale of 1-10. Pain in lower abdomens and back. RN told patient to call Dr office immediately because she will be taking to many Vicodin in a 24 hr period if she is taking 2 every 2 1/2 hrs and her pain is not controlled. Patient stated she would call right now. Per patient her COPD is good.   Plan: Patient will call doctor office right now RN will follow up with patient   [facility] [phone]

## 2017-06-09 ENCOUNTER — Other Ambulatory Visit: Payer: Self-pay | Admitting: Oncology

## 2017-06-09 NOTE — Progress Notes (Signed)
START OFF PATHWAY REGIMEN - Bladder   [id number]:Carboplatin AUC=5 D1 + Etoposide 120 mg/m2 D1-3 q28 Days:   A cycle is every 28 days:     Carboplatin      Etoposide   **Always confirm dose/schedule in your pharmacy ordering system**    Patient Characteristics: Metastatic Disease, First Line, No Prior Neoadjuvant/Adjuvant Therapy, Poor Renal Function (CrCl < 50 mL/min), Unknown PD-L1 Expression AJCC M Category: M1b AJCC N Category: NX AJCC T Category: TX Current evidence of distant metastases<= Yes AJCC 8 Stage Grouping: IVB Line of Therapy: First Line Prior Neoadjuvant/Adjuvant Therapy<= No Renal Function: Poor Renal Function (CrCl < 50 mL/min) PD-L1 Expression Status: Unknown PD-L1 Expression Intent of Therapy: Non-Curative / Palliative Intent, Discussed with Patient

## 2017-06-14 ENCOUNTER — Inpatient Hospital Stay: Payer: Medicare HMO

## 2017-06-14 ENCOUNTER — Telehealth: Payer: Self-pay | Admitting: Oncology

## 2017-06-14 ENCOUNTER — Other Ambulatory Visit: Payer: Medicare HMO

## 2017-06-14 ENCOUNTER — Ambulatory Visit: Payer: Medicare HMO | Admitting: Oncology

## 2017-06-14 ENCOUNTER — Inpatient Hospital Stay (HOSPITAL_BASED_OUTPATIENT_CLINIC_OR_DEPARTMENT_OTHER): Payer: Medicare HMO | Admitting: Oncology

## 2017-06-14 VITALS — BP 126/72 | HR 18 | Temp 96.1°F | Resp 18

## 2017-06-14 DIAGNOSIS — C671 Malignant neoplasm of dome of bladder: Secondary | ICD-10-CM

## 2017-06-14 DIAGNOSIS — D509 Iron deficiency anemia, unspecified: Secondary | ICD-10-CM

## 2017-06-14 DIAGNOSIS — C679 Malignant neoplasm of bladder, unspecified: Secondary | ICD-10-CM

## 2017-06-14 DIAGNOSIS — D500 Iron deficiency anemia secondary to blood loss (chronic): Secondary | ICD-10-CM

## 2017-06-14 LAB — CMP (CANCER CENTER ONLY)
ALBUMIN: 3.8 g/dL (ref 3.5–5.0)
ALT: 13 U/L (ref 0–55)
ANION GAP: 9 (ref 3–11)
AST: 15 U/L (ref 5–34)
Alkaline Phosphatase: 126 U/L (ref 40–150)
BUN: 14 mg/dL (ref 7–26)
CALCIUM: 9.2 mg/dL (ref 8.4–10.4)
CHLORIDE: 106 mmol/L (ref 98–109)
CO2: 23 mmol/L (ref 22–29)
Creatinine: 1.03 mg/dL (ref 0.60–1.10)
GFR, Est AFR Am: 57 mL/min — ABNORMAL LOW (ref >60)
GFR, Estimated: 50 mL/min — ABNORMAL LOW (ref >60)
GLUCOSE: 111 mg/dL (ref 70–140)
Potassium: 4.7 mmol/L (ref 3.5–5.1)
SODIUM: 138 mmol/L (ref 136–145)
Total Bilirubin: 0.3 mg/dL (ref 0.2–1.2)
Total Protein: 7.5 g/dL (ref 6.4–8.3)

## 2017-06-14 LAB — CBC WITH DIFFERENTIAL (CANCER CENTER ONLY)
BASOS PCT: 2 %
Basophils Absolute: 0.2 10*3/uL — ABNORMAL HIGH (ref 0.0–0.1)
EOS ABS: 0.2 10*3/uL (ref 0.0–0.5)
Eosinophils Relative: 3 %
HCT: 29.3 % — ABNORMAL LOW (ref 34.8–46.6)
Hemoglobin: 9 g/dL — ABNORMAL LOW (ref 11.6–15.9)
Lymphocytes Relative: 16 %
Lymphs Abs: 1.3 10*3/uL (ref 0.9–3.3)
MCH: 20.7 pg — ABNORMAL LOW (ref 25.1–34.0)
MCHC: 30.9 g/dL — AB (ref 31.5–36.0)
MCV: 66.9 fL — ABNORMAL LOW (ref 79.5–101.0)
Monocytes Absolute: 0.5 10*3/uL (ref 0.1–0.9)
Monocytes Relative: 6 %
NEUTROS PCT: 73 %
Neutro Abs: 6.2 10*3/uL (ref 1.5–6.5)
PLATELETS: 345 10*3/uL (ref 145–400)
RBC: 4.38 MIL/uL (ref 3.70–5.45)
RDW: 19 % — ABNORMAL HIGH (ref 11.2–14.5)
WBC: 8.5 10*3/uL (ref 3.9–10.3)

## 2017-06-14 LAB — IRON AND TIBC
Iron: 19 ug/dL — ABNORMAL LOW (ref 41–142)
SATURATION RATIOS: 4 % — AB (ref 21–57)
TIBC: 511 ug/dL — AB (ref 236–444)
UIBC: 492 ug/dL

## 2017-06-14 LAB — FERRITIN: Ferritin: <4 ng/mL — ABNORMAL LOW (ref 9–269)

## 2017-06-14 NOTE — Progress Notes (Signed)
Hematology and Oncology Follow Up Visit  Kristen Macias [id number] Feb 07, 1934 84 y.o. 06/14/2017 10:45 AM Mayo, Pete Pelt, MDMayo, Pete Pelt, MD   Principle Diagnosis: 84 year old woman with small cell carcinoma of the bladder diagnosed in February 2019.  She has disease confined to the bladder without any evidence of metastasis.   Prior Therapy: She is status post TURBT on April 22, 2017 which showed a 5 cm mass in the lateral wall of the bladder.  Current therapy: Under consideration to start chemotherapy.  Interim History: [name] presents today for a follow-up visit.  Since her last visit, she reports no major changes in her health.  She was evaluated by Dr. Tammi Klippel for possible radiation therapy and she is contemplating that option.  She denies any abdominal pain, flank pain or hematuria.  She denies any recent hospitalization or illnesses.  Her quality of life and performance status remains maintained.   She does not report any headaches, blurry vision, syncope or seizures. Does not report any fevers, chills or sweats.  Does not report any cough, wheezing or hemoptysis.  Does not report any chest pain, palpitation, orthopnea or leg edema.  Does not report any nausea, vomiting or abdominal pain.  Does not report any constipation or diarrhea.  Does not report any skeletal complaints.    Does not report frequency, urgency or hematuria.  Does not report any skin rashes or lesions. Does not report any heat or cold intolerance.  Does not report any lymphadenopathy or petechiae.  Does not report any anxiety or depression.  Remaining review of systems is negative.    Medications: I have reviewed the patient's current medications.  Current Outpatient Medications  Medication Sig Dispense Refill  . albuterol (VENTOLIN HFA) 108 (90 Base) MCG/ACT inhaler INHALE 1 TO 2 PUFFS INTO THE LUNGS EVERY 6 (SIX) HOURS AS NEEDED FOR WHEEZING OR SHORTNESS OF BREATH. 18 g 2  . alendronate (FOSAMAX) 70 MG tablet  TAKE 1 TABLET ONE TIME WEEKLY ON AN EMPTY STOMACH WITH FULL GLASS OF WATER 12 tablet 0  . atorvastatin (LIPITOR) 40 MG tablet Take 40 mg by mouth daily.    . Calcium Carb-Cholecalciferol (CALCIUM-VITAMIN D) 600-400 MG-UNIT TABS Take 2 tablets by mouth daily. 180 tablet 0  . cephALEXin (KEFLEX) 500 MG capsule Take 1 capsule (500 mg total) by mouth 4 (four) times daily. (Patient not taking: Reported on 05/31/2017) 28 capsule 0  . HYDROcodone-acetaminophen (NORCO) 5-325 MG tablet Take 1-2 tablets by mouth every 6 (six) hours as needed for moderate pain. 90 tablet 0  . metoprolol tartrate (LOPRESSOR) 25 MG tablet TAKE 2 TABLETS EVERY DAY (Patient taking differently: TAKE ONE TABLET (25 MG) BY MOUTH TWICE DAILY) 180 tablet 1  . Multiple Vitamin (MULTIVITAMIN WITH MINERALS) TABS tablet Take 1 tablet by mouth daily. Centrum Silver    . omeprazole (PRILOSEC) 20 MG capsule TAKE 1 CAPSULE TWICE DAILY  BEFORE  A  MEAL (Patient taking differently: TAKE 1 CAPSULE (20 MG) DAILY BEFORE BREAKFAST, MAY TAKE AN ADDITIONAL DOSE AS NEEDED FOR HEARTBURN OR INDIGESTION.) 180 capsule 2  . phenazopyridine (PYRIDIUM) 200 MG tablet Take 1 tablet (200 mg total) by mouth 3 (three) times daily as needed for pain. (Patient not taking: Reported on 05/31/2017) 10 tablet 0  . polyethylene glycol powder (GLYCOLAX/MIRALAX) powder Take 17 g by mouth daily. (Patient not taking: Reported on 05/31/2017) 3350 g 1  . ranitidine (ZANTAC) 150 MG capsule TAKE 1 CAPSULE TWICE DAILY FOR ACID REFLUX (Patient not taking:  Reported on 05/31/2017) 180 capsule 0  . tiotropium (SPIRIVA HANDIHALER) 18 MCG inhalation capsule INHALE THE CONTENTS OF 1 CAPSULE EVERY DAY 90 capsule 1  . traMADol (ULTRAM) 50 MG tablet Take 1-2 tablets (50-100 mg total) by mouth every 6 (six) hours as needed for moderate pain. (Patient not taking: Reported on 05/31/2017) 15 tablet 0   No current facility-administered medications for this visit.      Allergies: No Known  Allergies  Past Medical History, Surgical history, Social history, and Family History were reviewed and updated.    Physical Exam: Blood pressure 126/72, pulse (!) 18, temperature (!) 96.1 F (35.6 C), temperature source Oral, resp. rate 18, SpO2 99 %.   ECOG:  General appearance: alert and cooperative appeared without distress. Head: Normocephalic, without obvious abnormality Oropharynx: No oral thrush or ulcers. Eyes: No scleral icterus.  Pupils are equal and round reactive to light. Lymph nodes: Cervical, supraclavicular, and axillary nodes normal. Heart:regular rate and rhythm, S1, S2 normal, no murmur, click, rub or gallop Lung:chest clear, no wheezing, rales, normal symmetric air entry Abdomin: soft, non-tender, without masses or organomegaly. Neurological: No motor, sensory deficits.  Intact deep tendon reflexes. Skin: No rashes or lesions.  No ecchymosis or petechiae. Musculoskeletal: No joint deformity or effusion. Psychiatric: Mood and affect are appropriate.    Lab Results: Lab Results  Component Value Date   WBC 11.5 (H) 05/31/2017   HGB 9.0 (L) 04/19/2017   HCT 29.9 (L) 05/31/2017   MCV 71.9 (L) 05/31/2017   PLT 347 05/31/2017     Chemistry      Component Value Date/Time   NA 139 04/19/2017 1430   NA 140 08/20/2016 1650   K 5.1 04/19/2017 1430   CL 107 04/19/2017 1430   CO2 23 04/19/2017 1430   BUN 20 04/19/2017 1430   BUN 18 08/20/2016 1650   CREATININE 0.92 04/19/2017 1430   CREATININE 1.15 (H) 04/30/2016 1023      Component Value Date/Time   CALCIUM 9.3 04/19/2017 1430   ALKPHOS 111 03/15/2017 1400   AST 20 03/15/2017 1400   ALT 13 (L) 03/15/2017 1400   BILITOT 0.7 03/15/2017 1400   BILITOT 0.2 08/20/2016 1650       Impression and Plan:  84 year old woman with the following:  1.Small cell cancer of the bladder diagnosed in February 2019.    Her disease is organ confined without any distant metastasis based on her PET scan.  She is not a  candidate for a cystectomy.  The natural course of this disease was reviewed again with the patient and her family as well as the risks and benefits of treatments for this condition.  Long-term complication associated with chemotherapy were reviewed today.  After discussion today, she has refused to proceed with chemotherapy.  She understands the consequences of not receiving treatment which includes progression of disease, hematuria, increased pain and ultimately his disease related to her death.  She is fully aware of these consequences and elected to decline chemotherapy.  I discussed with her the role of radiation therapy again and I urged her to consider that as a palliative option especially if she develops any hematuria.   2.  Hematuria: Improved at this time.  She is off Eliquis which have helped the symptoms.  Palliative radiation therapy can be helpful if he develops any bleeding.  3.  IV access: Port-A-Cath will not be inserted as he declined chemotherapy.  4.  Prognosis: Her performance status remains reasonable at this  disease will likely progress rapidly and eventually will cause her morbidity and likely will require hospice in the next few months.  5.  Iron deficiency anemia: Iron levels will be checked today and will adjust iron replacement accordingly.  6.  Follow-up: In the next 2-3 months to follow her progress.  25  minutes was spent with the patient face-to-face today.  More than 50% of time was dedicated to patient counseling, education and answering questions regarding diagnosis, prognosis and future plan of care.Zola Button, MD 4/22/201910:45 AM

## 2017-06-14 NOTE — Telephone Encounter (Signed)
Scheduled appt per 4/22 los- Gave patient aVS and calender per los.  

## 2017-06-14 NOTE — Progress Notes (Signed)
Confirmed Etoposide dose w/ Dr. Alen Blew: "She will get Etoposide 75 on day 2 and 3 as well.  Thanks,  FS"  Orders changed accordingly.  Kennith Center, Pharm.D., CPP 06/14/2017@9 :41 AM

## 2017-06-15 ENCOUNTER — Other Ambulatory Visit: Payer: Medicare HMO

## 2017-06-15 ENCOUNTER — Telehealth: Payer: Self-pay | Admitting: *Deleted

## 2017-06-15 ENCOUNTER — Ambulatory Visit: Payer: Medicare HMO

## 2017-06-15 MED ORDER — FERROUS SULFATE 325 (65 FE) MG PO TBEC: 325.0000 mg | DELAYED_RELEASE_TABLET | Freq: Two times a day (BID) | ORAL | 3 refills | Status: DC

## 2017-06-15 NOTE — Telephone Encounter (Signed)
Spoke with patient, per dr Alen Blew, her iron is low and she needs to take iron supplement twice daily, medication was sent to her pharmacy.

## 2017-06-15 NOTE — Telephone Encounter (Signed)
-----   Message from Wyatt Portela, MD sent at 06/15/2017  7:31 AM EDT ----- Please let her know that her iron is low. She needs iron supplements twice a day. Rx will be sent to her pharmacy and she needs to start taking it.

## 2017-06-15 NOTE — Telephone Encounter (Signed)
Called patient to inform of sim appt. For 06-17-17 @ 3 pm,lvm for a return call

## 2017-06-15 NOTE — Addendum Note (Signed)
Addended by: Wyatt Portela on: 06/15/2017 07:33 AM   Modules accepted: Orders

## 2017-06-16 ENCOUNTER — Other Ambulatory Visit: Payer: Medicare HMO

## 2017-06-16 ENCOUNTER — Ambulatory Visit: Payer: Medicare HMO

## 2017-06-17 ENCOUNTER — Other Ambulatory Visit: Payer: Self-pay | Admitting: Radiation Oncology

## 2017-06-17 ENCOUNTER — Ambulatory Visit: Payer: Medicare HMO

## 2017-06-17 ENCOUNTER — Ambulatory Visit
Admission: RE | Admit: 2017-06-17 | Discharge: 2017-06-17 | Disposition: A | Discharge status: Home / Self Care | Payer: Medicare HMO | Source: Ambulatory Visit | Attending: Radiation Oncology | Admitting: Radiation Oncology

## 2017-06-17 DIAGNOSIS — C672 Malignant neoplasm of lateral wall of bladder: Secondary | ICD-10-CM

## 2017-06-17 DIAGNOSIS — Z510 Encounter for antineoplastic radiation therapy: Secondary | ICD-10-CM | POA: Diagnosis not present

## 2017-06-17 DIAGNOSIS — C673 Malignant neoplasm of anterior wall of bladder: Secondary | ICD-10-CM | POA: Diagnosis not present

## 2017-06-17 MED ORDER — HYDROCODONE-ACETAMINOPHEN 5-325 MG PO TABS: 1.0000 | ORAL_TABLET | Freq: Four times a day (QID) | ORAL | 0 refills | Status: DC | PRN

## 2017-06-18 ENCOUNTER — Other Ambulatory Visit: Payer: Self-pay | Admitting: *Deleted

## 2017-06-18 NOTE — Progress Notes (Signed)
  Radiation Oncology         [phone] ________________________________  Name: Kristen Macias MRN: [id number]  Date: 06/17/2017  DOB: 1934/01/09  SIMULATION AND TREATMENT PLANNING NOTE    ICD-10-CM   1. 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  Identity was confirmed.  All relevant records and images related to the planned course of therapy were reviewed.  The patient freely provided informed written consent to proceed with treatment after reviewing the details related to the planned course of therapy. The consent form was witnessed and verified by the simulation staff.  Then, the patient was set-up in a stable reproducible  supine position for radiation therapy.  CT images were obtained.  Surface markings were placed.  The CT images were loaded into the planning software.  Then the target and avoidance structures were contoured.  Treatment planning then occurred.  The radiation prescription was entered and confirmed.  Then, I designed and supervised the construction of a total of one medically necessary complex treatment device for leg positioning.  I have requested : Isodose Plan  PLAN:  The patient will receive 30 Gy in 10 fractions.  ________________________________  Sheral Apley Tammi Klippel, M.D.

## 2017-06-23 ENCOUNTER — Other Ambulatory Visit: Payer: Self-pay | Admitting: Internal Medicine

## 2017-06-23 DIAGNOSIS — Z510 Encounter for antineoplastic radiation therapy: Secondary | ICD-10-CM | POA: Diagnosis not present

## 2017-06-23 DIAGNOSIS — C673 Malignant neoplasm of anterior wall of bladder: Secondary | ICD-10-CM | POA: Diagnosis not present

## 2017-06-24 ENCOUNTER — Ambulatory Visit
Admission: RE | Admit: 2017-06-24 | Discharge: 2017-06-24 | Disposition: A | Discharge status: Home / Self Care | Payer: Medicare HMO | Source: Ambulatory Visit | Attending: Radiation Oncology | Admitting: Radiation Oncology

## 2017-06-24 DIAGNOSIS — C673 Malignant neoplasm of anterior wall of bladder: Secondary | ICD-10-CM | POA: Diagnosis not present

## 2017-06-24 DIAGNOSIS — Z510 Encounter for antineoplastic radiation therapy: Secondary | ICD-10-CM | POA: Diagnosis not present

## 2017-06-28 ENCOUNTER — Telehealth: Payer: Self-pay | Admitting: Nutrition

## 2017-06-28 ENCOUNTER — Ambulatory Visit
Admission: RE | Admit: 2017-06-28 | Discharge: 2017-06-28 | Disposition: A | Discharge status: Home / Self Care | Payer: Medicare HMO | Source: Ambulatory Visit | Attending: Radiation Oncology | Admitting: Radiation Oncology

## 2017-06-28 DIAGNOSIS — C673 Malignant neoplasm of anterior wall of bladder: Secondary | ICD-10-CM | POA: Diagnosis not present

## 2017-06-28 DIAGNOSIS — Z510 Encounter for antineoplastic radiation therapy: Secondary | ICD-10-CM | POA: Diagnosis not present

## 2017-06-28 NOTE — Telephone Encounter (Signed)
Patient requesting information on a healthy diet for weight loss. I encouraged patient to follow a plant based diet with lean protein. Discouraged rapid weight loss during treatment. Activity as allowed by MD. Will mail information to patient.

## 2017-06-29 ENCOUNTER — Ambulatory Visit
Admission: RE | Admit: 2017-06-29 | Discharge: 2017-06-29 | Disposition: A | Discharge status: Home / Self Care | Payer: Medicare HMO | Source: Ambulatory Visit | Attending: Radiation Oncology | Admitting: Radiation Oncology

## 2017-06-29 DIAGNOSIS — C673 Malignant neoplasm of anterior wall of bladder: Secondary | ICD-10-CM | POA: Diagnosis not present

## 2017-06-29 DIAGNOSIS — Z510 Encounter for antineoplastic radiation therapy: Secondary | ICD-10-CM | POA: Diagnosis not present

## 2017-06-30 ENCOUNTER — Ambulatory Visit
Admission: RE | Admit: 2017-06-30 | Discharge: 2017-06-30 | Disposition: A | Discharge status: Home / Self Care | Payer: Medicare HMO | Source: Ambulatory Visit | Attending: Radiation Oncology | Admitting: Radiation Oncology

## 2017-06-30 DIAGNOSIS — C673 Malignant neoplasm of anterior wall of bladder: Secondary | ICD-10-CM | POA: Diagnosis not present

## 2017-06-30 DIAGNOSIS — Z510 Encounter for antineoplastic radiation therapy: Secondary | ICD-10-CM | POA: Diagnosis not present

## 2017-07-01 ENCOUNTER — Other Ambulatory Visit: Payer: Self-pay | Admitting: Radiation Oncology

## 2017-07-01 ENCOUNTER — Ambulatory Visit
Admission: RE | Admit: 2017-07-01 | Discharge: 2017-07-01 | Disposition: A | Discharge status: Home / Self Care | Payer: Medicare HMO | Source: Ambulatory Visit | Attending: Radiation Oncology | Admitting: Radiation Oncology

## 2017-07-01 DIAGNOSIS — Z510 Encounter for antineoplastic radiation therapy: Secondary | ICD-10-CM | POA: Diagnosis not present

## 2017-07-01 DIAGNOSIS — C673 Malignant neoplasm of anterior wall of bladder: Secondary | ICD-10-CM | POA: Diagnosis not present

## 2017-07-01 MED ORDER — PROCHLORPERAZINE MALEATE 10 MG PO TABS: 10.0000 mg | ORAL_TABLET | Freq: Four times a day (QID) | ORAL | 5 refills | Status: DC | PRN

## 2017-07-02 ENCOUNTER — Ambulatory Visit
Admission: RE | Admit: 2017-07-02 | Discharge: 2017-07-02 | Disposition: A | Discharge status: Home / Self Care | Payer: Medicare HMO | Source: Ambulatory Visit | Attending: Radiation Oncology | Admitting: Radiation Oncology

## 2017-07-02 DIAGNOSIS — Z510 Encounter for antineoplastic radiation therapy: Secondary | ICD-10-CM | POA: Diagnosis not present

## 2017-07-02 DIAGNOSIS — C673 Malignant neoplasm of anterior wall of bladder: Secondary | ICD-10-CM | POA: Diagnosis not present

## 2017-07-05 ENCOUNTER — Ambulatory Visit
Admission: RE | Admit: 2017-07-05 | Discharge: 2017-07-05 | Disposition: A | Discharge status: Home / Self Care | Payer: Medicare HMO | Source: Ambulatory Visit | Attending: Radiation Oncology | Admitting: Radiation Oncology

## 2017-07-05 DIAGNOSIS — C673 Malignant neoplasm of anterior wall of bladder: Secondary | ICD-10-CM | POA: Diagnosis not present

## 2017-07-05 DIAGNOSIS — Z510 Encounter for antineoplastic radiation therapy: Secondary | ICD-10-CM | POA: Diagnosis not present

## 2017-07-06 ENCOUNTER — Ambulatory Visit: Payer: Medicare HMO

## 2017-07-07 ENCOUNTER — Ambulatory Visit
Admission: RE | Admit: 2017-07-07 | Discharge: 2017-07-07 | Disposition: A | Discharge status: Home / Self Care | Payer: Medicare HMO | Source: Ambulatory Visit | Attending: Radiation Oncology | Admitting: Radiation Oncology

## 2017-07-07 DIAGNOSIS — C673 Malignant neoplasm of anterior wall of bladder: Secondary | ICD-10-CM | POA: Diagnosis not present

## 2017-07-07 DIAGNOSIS — Z510 Encounter for antineoplastic radiation therapy: Secondary | ICD-10-CM | POA: Diagnosis not present

## 2017-07-08 ENCOUNTER — Ambulatory Visit
Admission: RE | Admit: 2017-07-08 | Discharge: 2017-07-08 | Disposition: A | Discharge status: Home / Self Care | Payer: Medicare HMO | Source: Ambulatory Visit | Attending: Radiation Oncology | Admitting: Radiation Oncology

## 2017-07-08 DIAGNOSIS — C673 Malignant neoplasm of anterior wall of bladder: Secondary | ICD-10-CM | POA: Diagnosis not present

## 2017-07-08 DIAGNOSIS — Z510 Encounter for antineoplastic radiation therapy: Secondary | ICD-10-CM | POA: Diagnosis not present

## 2017-07-09 ENCOUNTER — Ambulatory Visit: Payer: Medicare HMO

## 2017-07-09 ENCOUNTER — Ambulatory Visit
Admission: RE | Admit: 2017-07-09 | Discharge: 2017-07-09 | Disposition: A | Discharge status: Home / Self Care | Payer: Medicare HMO | Source: Ambulatory Visit | Attending: Radiation Oncology | Admitting: Radiation Oncology

## 2017-07-09 DIAGNOSIS — Z510 Encounter for antineoplastic radiation therapy: Secondary | ICD-10-CM | POA: Diagnosis not present

## 2017-07-09 DIAGNOSIS — C673 Malignant neoplasm of anterior wall of bladder: Secondary | ICD-10-CM | POA: Diagnosis not present

## 2017-07-12 ENCOUNTER — Ambulatory Visit: Payer: Medicare HMO

## 2017-07-12 ENCOUNTER — Ambulatory Visit
Admission: RE | Admit: 2017-07-12 | Discharge: 2017-07-12 | Disposition: A | Discharge status: Home / Self Care | Payer: Medicare HMO | Source: Ambulatory Visit | Attending: Radiation Oncology | Admitting: Radiation Oncology

## 2017-07-12 DIAGNOSIS — C673 Malignant neoplasm of anterior wall of bladder: Secondary | ICD-10-CM

## 2017-07-12 DIAGNOSIS — Z510 Encounter for antineoplastic radiation therapy: Secondary | ICD-10-CM | POA: Diagnosis not present

## 2017-07-12 NOTE — Progress Notes (Signed)
  Radiation Oncology         [phone] ________________________________  Name: DEMITA TOBIA MRN: [id number]  Date: 07/12/2017  DOB: 03/14/1933  SIMULATION AND TREATMENT PLANNING NOTE    ICD-10-CM   1. Malignant neoplasm of anterior wall of urinary bladder (HCC) C67.3     DIAGNOSIS: 84 yo woman with localized small cell carcinoma of the bladder  NARRATIVE:  The patient was brought to the CT Simulation planning suite to sim her boost with a full bladder.  Identity was confirmed.  All relevant records and images related to the planned course of therapy were reviewed.  Then, the patient was set-up in a stable reproducible  supine position for radiation therapy.  CT images were obtained.    The CT images were loaded into the planning software.  Then the target and avoidance structures were contoured.  Treatment planning then occurred.   PLAN:  The patient will receive a boost of 15 Gy in 5 fraction.  ________________________________  Sheral Apley Tammi Klippel, M.D.  This document serves as a record of services personally performed by Tyler Pita, MD. It was created on his behalf by Bethann Humble, a trained medical scribe. The creation of this record is based on the scribe's personal observations and the provider's statements to them. This document has been checked and approved by the attending provider.

## 2017-07-13 ENCOUNTER — Ambulatory Visit
Admission: RE | Admit: 2017-07-13 | Discharge: 2017-07-13 | Disposition: A | Discharge status: Home / Self Care | Payer: Medicare HMO | Source: Ambulatory Visit | Attending: Radiation Oncology | Admitting: Radiation Oncology

## 2017-07-13 ENCOUNTER — Ambulatory Visit: Payer: Medicare HMO

## 2017-07-13 DIAGNOSIS — C673 Malignant neoplasm of anterior wall of bladder: Secondary | ICD-10-CM | POA: Diagnosis not present

## 2017-07-13 DIAGNOSIS — Z510 Encounter for antineoplastic radiation therapy: Secondary | ICD-10-CM | POA: Diagnosis not present

## 2017-07-14 ENCOUNTER — Ambulatory Visit: Payer: Medicare HMO

## 2017-07-14 ENCOUNTER — Ambulatory Visit
Admission: RE | Admit: 2017-07-14 | Discharge: 2017-07-14 | Disposition: A | Discharge status: Home / Self Care | Payer: Medicare HMO | Source: Ambulatory Visit | Attending: Radiation Oncology | Admitting: Radiation Oncology

## 2017-07-14 DIAGNOSIS — C673 Malignant neoplasm of anterior wall of bladder: Secondary | ICD-10-CM | POA: Diagnosis not present

## 2017-07-14 DIAGNOSIS — Z510 Encounter for antineoplastic radiation therapy: Secondary | ICD-10-CM | POA: Diagnosis not present

## 2017-07-15 ENCOUNTER — Ambulatory Visit: Payer: Medicare HMO

## 2017-07-15 ENCOUNTER — Ambulatory Visit
Admission: RE | Admit: 2017-07-15 | Discharge: 2017-07-15 | Disposition: A | Discharge status: Home / Self Care | Payer: Medicare HMO | Source: Ambulatory Visit | Attending: Radiation Oncology | Admitting: Radiation Oncology

## 2017-07-15 DIAGNOSIS — C673 Malignant neoplasm of anterior wall of bladder: Secondary | ICD-10-CM | POA: Diagnosis not present

## 2017-07-15 DIAGNOSIS — Z510 Encounter for antineoplastic radiation therapy: Secondary | ICD-10-CM | POA: Diagnosis not present

## 2017-07-16 ENCOUNTER — Ambulatory Visit: Payer: Medicare HMO

## 2017-07-16 ENCOUNTER — Ambulatory Visit
Admission: RE | Admit: 2017-07-16 | Discharge: 2017-07-16 | Disposition: A | Discharge status: Home / Self Care | Payer: Medicare HMO | Source: Ambulatory Visit | Attending: Radiation Oncology | Admitting: Radiation Oncology

## 2017-07-16 DIAGNOSIS — C673 Malignant neoplasm of anterior wall of bladder: Secondary | ICD-10-CM | POA: Diagnosis not present

## 2017-07-16 DIAGNOSIS — Z510 Encounter for antineoplastic radiation therapy: Secondary | ICD-10-CM | POA: Diagnosis not present

## 2017-07-19 ENCOUNTER — Ambulatory Visit: Payer: Medicare HMO

## 2017-07-20 ENCOUNTER — Ambulatory Visit
Admission: RE | Admit: 2017-07-20 | Discharge: 2017-07-20 | Disposition: A | Discharge status: Home / Self Care | Payer: Medicare HMO | Source: Ambulatory Visit | Attending: Radiation Oncology | Admitting: Radiation Oncology

## 2017-07-20 DIAGNOSIS — Z510 Encounter for antineoplastic radiation therapy: Secondary | ICD-10-CM | POA: Diagnosis not present

## 2017-07-20 DIAGNOSIS — C673 Malignant neoplasm of anterior wall of bladder: Secondary | ICD-10-CM | POA: Diagnosis not present

## 2017-07-26 ENCOUNTER — Other Ambulatory Visit: Payer: Self-pay | Admitting: Internal Medicine

## 2017-07-26 ENCOUNTER — Encounter: Payer: Self-pay | Admitting: Radiation Oncology

## 2017-07-26 NOTE — Progress Notes (Signed)
  Radiation Oncology         [phone] ________________________________  Name: Kristen Macias MRN: [id number]  Date: 07/26/2017  DOB: 04-30-1933  End of Treatment Note  Diagnosis:  84 yo woman with localized small cell carcinoma of the bladder  Indication for treatment:  Palliative  Radiation treatment dates:   06/28/17 - 07/20/17  Site/dose:   Bladder treated to 30 Gy in 10 fx of 3 Gy followed by boost of 15 Gy with 5 fx of 3 Gy  Beams/energy:   Isodose / 15X  Narrative: The patient tolerated radiation treatment relatively well.   She reported nocturia and mild fatigue during treatment but otherwise, no significant adverse effects.  Plan: The patient has completed radiation treatment. The patient will return to radiation oncology clinic for routine followup in one month. I advised her to call or return sooner if she has any questions or concerns related to her recovery or treatment. ________________________________  Sheral Apley. Tammi Klippel, M.D.   This document serves as a record of services personally performed by Tyler Pita, MD. It was created on his behalf by Linward Natal, a trained medical scribe. The creation of this record is based on the scribe's personal observations and the provider's statements to them. This document has been checked and approved by the attending provider.

## 2017-08-11 ENCOUNTER — Other Ambulatory Visit: Payer: Self-pay | Admitting: Internal Medicine

## 2017-08-18 ENCOUNTER — Other Ambulatory Visit: Payer: Self-pay | Admitting: *Deleted

## 2017-08-18 NOTE — Patient Outreach (Addendum)
[facility]) Care Management  08/18/2017   Kristen Macias 08-02-1933 [id number]  RN Health Coach telephone call to patient.  [employer] compliance verified. Per patient she is doing very good. Patient is in the green zone. Patient is not having to use any rescue medications or nebulizer treatments.Patient has finished her radiation therapy treatments. Per patient she is still having some pain in the perineum area. Patient is taking aleve for discomfort. Patient stated she had ran out of the other pain medication. RN made patient if the pain is real bad that she could call her physician. Per patient she can wait for her next appointment in a couple days. Patient stated she is walking outside to look at her flowers. Patient has agreed to further outreach calls.    Current Medications:  Current Outpatient Medications  Medication Sig Dispense Refill  . albuterol (VENTOLIN HFA) 108 (90 Base) MCG/ACT inhaler INHALE 1 TO 2 PUFFS INTO THE LUNGS EVERY 6 (SIX) HOURS AS NEEDED FOR WHEEZING OR SHORTNESS OF BREATH. 18 g 2  . alendronate (FOSAMAX) 70 MG tablet TAKE 1 TABLET ONE TIME WEEKLY ON AN EMPTY STOMACH  WITH  FULL  GLASS  OF  WATER 12 tablet 0  . atorvastatin (LIPITOR) 40 MG tablet Take 40 mg by mouth daily.    . Calcium Carb-Cholecalciferol (CALCIUM-VITAMIN D) 600-400 MG-UNIT TABS Take 2 tablets by mouth daily. 180 tablet 0  . ferrous sulfate 325 (65 FE) MG EC tablet Take 1 tablet (325 mg total) by mouth 2 (two) times daily. 60 tablet 3  . HYDROcodone-acetaminophen (NORCO) 5-325 MG tablet Take 1-2 tablets by mouth every 6 (six) hours as needed for moderate pain. 120 tablet 0  . metoprolol tartrate (LOPRESSOR) 25 MG tablet TAKE ONE TABLET (25 MG) BY MOUTH TWICE DAILY 180 tablet 1  . Multiple Vitamin (MULTIVITAMIN WITH MINERALS) TABS tablet Take 1 tablet by mouth daily. Centrum Silver    . omeprazole (PRILOSEC) 20 MG capsule TAKE 1 CAPSULE TWICE DAILY  BEFORE  A  MEAL (Patient taking  differently: TAKE 1 CAPSULE (20 MG) DAILY BEFORE BREAKFAST, MAY TAKE AN ADDITIONAL DOSE AS NEEDED FOR HEARTBURN OR INDIGESTION.) 180 capsule 2  . polyethylene glycol powder (GLYCOLAX/MIRALAX) powder Take 17 g by mouth daily. 3350 g 1  . prochlorperazine (COMPAZINE) 10 MG tablet Take 1 tablet (10 mg total) by mouth every 6 (six) hours as needed for nausea or vomiting. 30 tablet 5  . ranitidine (ZANTAC) 150 MG capsule TAKE 1 CAPSULE TWICE DAILY FOR ACID REFLUX 180 capsule 0  . tiotropium (SPIRIVA HANDIHALER) 18 MCG inhalation capsule INHALE THE CONTENTS OF 1 CAPSULE EVERY DAY 90 capsule 1  . traMADol (ULTRAM) 50 MG tablet Take 1-2 tablets (50-100 mg total) by mouth every 6 (six) hours as needed for moderate pain. 15 tablet 0   No current facility-administered medications for this visit.     Functional Status:  In your present state of health, do you have any difficulty performing the following activities: 06/08/2017 05/11/2017  Hearing? N N  Vision? N N  Difficulty concentrating or making decisions? N N  Walking or climbing stairs? N N  Dressing or bathing? N N  Doing errands, shopping? N N  Preparing Food and eating ? N N  Using the Toilet? N N  In the past six months, have you accidently leaked urine? N N  Do you have problems with loss of bowel control? N N  Managing your Medications? N N  Managing your  Finances? - N  Housekeeping or managing your Housekeeping? N N  Some recent data might be hidden    Fall/Depression Screening: Fall Risk  08/18/2017 06/18/2017 06/08/2017  Falls in the past year? No No No  Number falls in past yr: - - -  Injury with Fall? - - -  Risk for fall due to : - - -   PHQ 2/9 Scores 08/18/2017 06/18/2017 06/08/2017 05/31/2017 05/28/2017 05/11/2017 04/21/2017  PHQ - 2 Score 0 0 0 0 0 1 1   [facility] CM Care Plan Problem One     Most Recent Value  Care Plan Problem One  knowledge deficit in Self management of COPD  Role Documenting the Problem One  Health [facility]  for Problem One  Active  [facility] Long Term Goal   Patient will have have any admissions for COPD within the next 90 days  [facility] Long Term Goal Start Date  08/18/17  Interventions for Problem One Long Term Goal  RN reiterated the zones and patient taking her medications as per ordered. RN with reiterate zones and action plan each outreach call  [facility] CM Short Term Goal #1   Patient will be able to verbalize what the yellow zone symptoms are with COPD within the next 30 days  [facility] CM Short Term Goal #1 Met Date  08/18/17  [facility] CM Short Term Goal #2   Patient will be able to verbalize what the action plan is for yellow zone within 30 days  [facility] CM Short Term Goal #2 Met Date  08/18/17  [facility] CM Short Term Goal #3  Patient will verbalize what is heat exhaustion  [facility] CM Short Term Goal #3 Start Date  08/18/17  Interventions for Short Tern Goal #3  RN discusse with patient about not overheating. RN sent educational materialon heat exhaustion. RN will follow uo with further discussion  [facility] CM Short Term Goal #4  Patient will be able to verbalize a better understanding of adult rehydration  [facility] CM Short Term Goal #4 Start Date  08/18/17  Interventions for Short Term Goal #4  RN discussed the probelms with overheating durng the summer months. RN sent educational material reguarding the elderly rehydration,. RN will follow up with further outreach discussion      Assessment:  Patient is in the green zone Patient has finished her radiation therapy Patient is having some pain   Plan:  RN discussed medication adherence RN sent patient educational material on heat exhaustion RN sent patient educational material on elderly rehydration Patient has a follow up appointment with oncology on 08/20/2017 RN will follow up outreach within the month of [place] [facility] Management [phone]

## 2017-08-20 ENCOUNTER — Other Ambulatory Visit: Payer: Self-pay

## 2017-08-20 ENCOUNTER — Encounter: Payer: Self-pay | Admitting: Urology

## 2017-08-20 ENCOUNTER — Ambulatory Visit
Admission: RE | Admit: 2017-08-20 | Discharge: 2017-08-20 | Disposition: A | Discharge status: Home / Self Care | Payer: Medicare HMO | Source: Ambulatory Visit | Attending: Urology | Admitting: Urology

## 2017-08-20 ENCOUNTER — Other Ambulatory Visit: Payer: Self-pay | Admitting: Urology

## 2017-08-20 VITALS — BP 125/79 | HR 80 | Temp 98.2°F | Resp 20 | Ht 63.0 in | Wt 183.6 lb

## 2017-08-20 DIAGNOSIS — C672 Malignant neoplasm of lateral wall of bladder: Secondary | ICD-10-CM

## 2017-08-20 DIAGNOSIS — C679 Malignant neoplasm of bladder, unspecified: Secondary | ICD-10-CM | POA: Insufficient documentation

## 2017-08-20 DIAGNOSIS — M549 Dorsalgia, unspecified: Secondary | ICD-10-CM | POA: Insufficient documentation

## 2017-08-20 DIAGNOSIS — Y842 Radiological procedure and radiotherapy as the cause of abnormal reaction of the patient, or of later complication, without mention of misadventure at the time of the procedure: Secondary | ICD-10-CM | POA: Diagnosis not present

## 2017-08-20 DIAGNOSIS — G8929 Other chronic pain: Secondary | ICD-10-CM | POA: Diagnosis not present

## 2017-08-20 MED ORDER — HYDROCODONE-ACETAMINOPHEN 5-325 MG PO TABS: 1.0000 | ORAL_TABLET | Freq: Four times a day (QID) | ORAL | 0 refills | Status: DC | PRN

## 2017-08-20 NOTE — Addendum Note (Signed)
Encounter addended by: Malena Edman, RN on: 08/20/2017 2:45 PM  Actions taken: Charge Capture section accepted

## 2017-08-20 NOTE — Progress Notes (Signed)
Radiation Oncology         [phone] ________________________________  Name: Kristen Macias MRN: [id number]  Date: 08/20/2017  DOB: 1933-05-15  Post Treatment Note  CC: Mayo, Pete Pelt, MD  Wyatt Portela, MD  Diagnosis:   84 yo woman with localized small cell carcinoma of the bladder  Interval Since Last Radiation:  4 weeks, palliative 06/28/17 - 07/20/17:   Bladder treated to 30 Gy in 10 fx of 3 Gy followed by boost of 15 Gy with 5 fx of 3 Gy  Narrative:  The patient returns today for routine follow-up.  She tolerated radiation treatment relatively well.   She reported nocturia and mild fatigue during treatment but otherwise, no significant adverse effects.                          On review of systems, the patient states that she is doing well overall.  She continues with increased frequency, urgency and occasional urge incontinence with nocturia 1-4 times per night.  She reports that the urge to urinate does not wake her from sleep but typically she is awakened by her chronic back pain and once she is awake she has to go to the restroom.  She is able to sleep through the night if she takes her Vicodin before bed.  She saw a spot of scant blood on the toilet tissue after wiping 1 week ago but denies seeing blood on the tissue since that time and has not seen any gross hematuria.  She specifically denies dysuria, weak stream, straining to void or incomplete emptying.  She denies abdominal pain, nausea, vomiting or diarrhea.  She reports a healthy appetite and is maintaining her weight.  She continues with mild fatigue but feels that this is gradually improving as well.  ALLERGIES:  has No Known Allergies.  Meds: Current Outpatient Medications  Medication Sig Dispense Refill  . albuterol (VENTOLIN HFA) 108 (90 Base) MCG/ACT inhaler INHALE 1 TO 2 PUFFS INTO THE LUNGS EVERY 6 (SIX) HOURS AS NEEDED FOR WHEEZING OR SHORTNESS OF BREATH. 18 g 2  . alendronate (FOSAMAX) 70 MG tablet TAKE 1 TABLET  ONE TIME WEEKLY ON AN EMPTY STOMACH  WITH  FULL  GLASS  OF  WATER 12 tablet 0  . atorvastatin (LIPITOR) 40 MG tablet Take 40 mg by mouth daily.    . Calcium Carb-Cholecalciferol (CALCIUM-VITAMIN D) 600-400 MG-UNIT TABS Take 2 tablets by mouth daily. 180 tablet 0  . ferrous sulfate 325 (65 FE) MG EC tablet Take 1 tablet (325 mg total) by mouth 2 (two) times daily. 60 tablet 3  . metoprolol tartrate (LOPRESSOR) 25 MG tablet TAKE ONE TABLET (25 MG) BY MOUTH TWICE DAILY 180 tablet 1  . Multiple Vitamin (MULTIVITAMIN WITH MINERALS) TABS tablet Take 1 tablet by mouth daily. Centrum Silver    . Naproxen Sod-diphenhydrAMINE (ALEVE PM PO) Take by mouth.    Marland Kitchen omeprazole (PRILOSEC) 20 MG capsule TAKE 1 CAPSULE TWICE DAILY  BEFORE  A  MEAL (Patient taking differently: TAKE 1 CAPSULE (20 MG) DAILY BEFORE BREAKFAST, MAY TAKE AN ADDITIONAL DOSE AS NEEDED FOR HEARTBURN OR INDIGESTION.) 180 capsule 2  . polyethylene glycol powder (GLYCOLAX/MIRALAX) powder Take 17 g by mouth daily. 3350 g 1  . tiotropium (SPIRIVA HANDIHALER) 18 MCG inhalation capsule INHALE THE CONTENTS OF 1 CAPSULE EVERY DAY 90 capsule 1  . HYDROcodone-acetaminophen (NORCO) 5-325 MG tablet Take 1-2 tablets by mouth every 6 (six) hours  as needed for moderate pain. 120 tablet 0  . prochlorperazine (COMPAZINE) 10 MG tablet Take 1 tablet (10 mg total) by mouth every 6 (six) hours as needed for nausea or vomiting. (Patient not taking: Reported on 08/20/2017) 30 tablet 5  . ranitidine (ZANTAC) 150 MG capsule TAKE 1 CAPSULE TWICE DAILY FOR ACID REFLUX (Patient not taking: Reported on 08/20/2017) 180 capsule 0  . traMADol (ULTRAM) 50 MG tablet Take 1-2 tablets (50-100 mg total) by mouth every 6 (six) hours as needed for moderate pain. (Patient not taking: Reported on 08/20/2017) 15 tablet 0   No current facility-administered medications for this encounter.     Physical Findings:  height is 5\' 3"  (1.6 m) and weight is 183 lb 9.6 oz (83.3 kg). Her oral  temperature is 98.2 F (36.8 C). Her blood pressure is 125/79 and her pulse is 80. Her respiration is 20 and oxygen saturation is 97%.  Pain Assessment Pain Score: 5  Pain Loc: Back(Back and abdomen)/10 In general this is a well appearing Caucasian female in no acute distress.  She's alert and oriented x4 and appropriate throughout the examination. Cardiopulmonary assessment is negative for acute distress and she exhibits normal effort.   Lab Findings: Lab Results  Component Value Date   WBC 8.5 06/14/2017   HGB 9.0 (L) 06/14/2017   HCT 29.3 (L) 06/14/2017   MCV 66.9 (L) 06/14/2017   PLT 345 06/14/2017     Radiographic Findings: No results found.  Impression/Plan: 92. 84 yo woman with localized small cell carcinoma of the bladder. She appears to be recovering well from the effects of radiotherapy and is currently without complaints.  She has a planned follow-up visit with Dr. Alen Blew on 09/22/2017 and although no currently scheduled appointment, plans to follow-up with Dr. Louis Meckel at [facility] in the near future as well.  We discussed that while we are happy to continue to participate in her care if clinically indicated, at this point, we will plan to see her back on an as-needed basis.  She will continue in routine follow-up under the care and direction of Dr. Louis Meckel for surveillance cystoscopies and Dr. Alen Blew in medical oncology.  She knows to call us at anytime with any questions or concerns related to her previous radiotherapy.  She appears to have a good understanding of her disease and our recommendations and is in agreement with this plan.     Nicholos Johns, PA-C

## 2017-09-17 ENCOUNTER — Ambulatory Visit: Payer: Self-pay | Admitting: *Deleted

## 2017-09-22 ENCOUNTER — Inpatient Hospital Stay: Payer: Medicare HMO

## 2017-09-22 ENCOUNTER — Inpatient Hospital Stay: Payer: Medicare HMO | Attending: Oncology | Admitting: Oncology

## 2017-09-28 ENCOUNTER — Other Ambulatory Visit: Payer: Self-pay

## 2017-10-05 ENCOUNTER — Other Ambulatory Visit: Payer: Self-pay

## 2017-10-06 MED ORDER — OMEPRAZOLE 20 MG PO CPDR: DELAYED_RELEASE_CAPSULE | ORAL | 0 refills | Status: DC

## 2017-10-06 NOTE — Telephone Encounter (Signed)
Refilled patient's omeprazole. Please have patient make appointment to discuss continuing this medication as it can have adverse effects if taken long term, especially in her age.

## 2017-10-06 NOTE — Telephone Encounter (Signed)
Patient informed that medication was called in and appt made for 11/24/17. Jaira Canady,CMA

## 2017-10-12 ENCOUNTER — Other Ambulatory Visit: Payer: Self-pay | Admitting: *Deleted

## 2017-10-12 DIAGNOSIS — J449 Chronic obstructive pulmonary disease, unspecified: Secondary | ICD-10-CM

## 2017-10-12 MED ORDER — TIOTROPIUM BROMIDE MONOHYDRATE 18 MCG IN CAPS: ORAL_CAPSULE | RESPIRATORY_TRACT | 2 refills | Status: DC

## 2017-10-14 ENCOUNTER — Other Ambulatory Visit: Payer: Self-pay | Admitting: *Deleted

## 2017-10-15 MED ORDER — ALENDRONATE SODIUM 70 MG PO TABS: ORAL_TABLET | ORAL | 0 refills | Status: DC

## 2017-10-19 DIAGNOSIS — N390 Urinary tract infection, site not specified: Secondary | ICD-10-CM | POA: Diagnosis not present

## 2017-10-19 DIAGNOSIS — C678 Malignant neoplasm of overlapping sites of bladder: Secondary | ICD-10-CM | POA: Diagnosis not present

## 2017-10-20 ENCOUNTER — Telehealth: Payer: Self-pay | Admitting: *Deleted

## 2017-10-20 NOTE — Telephone Encounter (Signed)
Kristen Macias returned call.  A letter created by [place] can be faxed to [phone] Attn: Kristen Macias , if we agree. Fleeger, Salome Spotted, CMA

## 2017-10-20 NOTE — Telephone Encounter (Signed)
Pt is due for a routine bladder biopsy since she has has a Hx of bladder cancer.  [facility] (pam-surgical scheduler) needs clearance to schedule pt.  Advised I would send message to MD.  After I hungup I realized that I did not know if pam needed verbal or a form filled out for clearance. I lmovm for Pam to return call. Fleeger, Salome Spotted, CMA

## 2017-10-27 NOTE — Telephone Encounter (Signed)
Pam with [facility] left message stating she is returning a call to Dr. Gwendlyn Deutscher who requested more information.  This is under general anesthesia in the hospital for approx 60 minutes. Patient is not on any anti-coagulants or aspirin.   Pam's call back is [phone], ext [phone]  Clearance letter can be faxed to [phone]  Danley Danker, RN [facility] RN)

## 2017-10-27 NOTE — Telephone Encounter (Signed)
I did not call neither do I know the patient. Please confirm the provider and route information to them.

## 2017-10-27 NOTE — Telephone Encounter (Signed)
Message was also routed to PCP.   Danley Danker, RN [facility] RN)

## 2017-10-29 ENCOUNTER — Encounter: Payer: Self-pay | Admitting: Family Medicine

## 2017-10-29 NOTE — Telephone Encounter (Signed)
Letter faxed to [facility]  °

## 2017-11-01 ENCOUNTER — Other Ambulatory Visit: Payer: Self-pay | Admitting: *Deleted

## 2017-11-01 ENCOUNTER — Other Ambulatory Visit: Payer: Self-pay | Admitting: Urology

## 2017-11-01 NOTE — Patient Outreach (Signed)
[facility]) Care Management  11/01/2017  Kristen Macias 18-Mar-1933 [id number]   RN Health Coach attempted#1 follow up outreach call to patient.  Patient was unavailable. HIPPA compliance voicemail message left with return callback number.  Plan: RN will call patient again within 30 days.  [place] Care Management [phone]

## 2017-11-03 ENCOUNTER — Encounter (HOSPITAL_BASED_OUTPATIENT_CLINIC_OR_DEPARTMENT_OTHER): Payer: Self-pay

## 2017-11-03 ENCOUNTER — Other Ambulatory Visit: Payer: Self-pay

## 2017-11-03 NOTE — Progress Notes (Signed)
Spoke with:  Kristen Macias NPO:  After Midnight, no gum, candy, or mints   Arrival time: [id number] Labs: Istat 4 (EKG 03/23/17 chart/epic) AM medications:  Metoprolol, Omeprazole, Inhaler Pre op orders:  Needs second sign Ride home:  [place] (sister) [phone]

## 2017-11-11 ENCOUNTER — Other Ambulatory Visit: Payer: Self-pay

## 2017-11-11 ENCOUNTER — Ambulatory Visit (HOSPITAL_BASED_OUTPATIENT_CLINIC_OR_DEPARTMENT_OTHER): Payer: Medicare HMO | Admitting: Anesthesiology

## 2017-11-11 ENCOUNTER — Encounter (HOSPITAL_BASED_OUTPATIENT_CLINIC_OR_DEPARTMENT_OTHER)
Admission: RE | Disposition: A | Discharge status: Home / Self Care | Payer: Self-pay | Source: Ambulatory Visit | Attending: Urology

## 2017-11-11 ENCOUNTER — Ambulatory Visit (HOSPITAL_BASED_OUTPATIENT_CLINIC_OR_DEPARTMENT_OTHER)
Admission: RE | Admit: 2017-11-11 | Discharge: 2017-11-11 | Disposition: A | Discharge status: Home / Self Care | Payer: Medicare HMO | Source: Ambulatory Visit | Attending: Urology | Admitting: Urology

## 2017-11-11 ENCOUNTER — Encounter (HOSPITAL_BASED_OUTPATIENT_CLINIC_OR_DEPARTMENT_OTHER): Payer: Self-pay | Admitting: Anesthesiology

## 2017-11-11 DIAGNOSIS — C672 Malignant neoplasm of lateral wall of bladder: Secondary | ICD-10-CM

## 2017-11-11 DIAGNOSIS — F17210 Nicotine dependence, cigarettes, uncomplicated: Secondary | ICD-10-CM | POA: Insufficient documentation

## 2017-11-11 DIAGNOSIS — I739 Peripheral vascular disease, unspecified: Secondary | ICD-10-CM | POA: Diagnosis not present

## 2017-11-11 DIAGNOSIS — R7303 Prediabetes: Secondary | ICD-10-CM | POA: Diagnosis not present

## 2017-11-11 DIAGNOSIS — K219 Gastro-esophageal reflux disease without esophagitis: Secondary | ICD-10-CM | POA: Insufficient documentation

## 2017-11-11 DIAGNOSIS — R351 Nocturia: Secondary | ICD-10-CM | POA: Insufficient documentation

## 2017-11-11 DIAGNOSIS — M810 Age-related osteoporosis without current pathological fracture: Secondary | ICD-10-CM | POA: Insufficient documentation

## 2017-11-11 DIAGNOSIS — C678 Malignant neoplasm of overlapping sites of bladder: Secondary | ICD-10-CM | POA: Diagnosis not present

## 2017-11-11 DIAGNOSIS — E669 Obesity, unspecified: Secondary | ICD-10-CM | POA: Insufficient documentation

## 2017-11-11 DIAGNOSIS — R350 Frequency of micturition: Secondary | ICD-10-CM | POA: Diagnosis not present

## 2017-11-11 DIAGNOSIS — I10 Essential (primary) hypertension: Secondary | ICD-10-CM | POA: Diagnosis not present

## 2017-11-11 DIAGNOSIS — J449 Chronic obstructive pulmonary disease, unspecified: Secondary | ICD-10-CM | POA: Insufficient documentation

## 2017-11-11 DIAGNOSIS — I4891 Unspecified atrial fibrillation: Secondary | ICD-10-CM | POA: Diagnosis not present

## 2017-11-11 DIAGNOSIS — Z6828 Body mass index (BMI) 28.0-28.9, adult: Secondary | ICD-10-CM | POA: Diagnosis not present

## 2017-11-11 DIAGNOSIS — C679 Malignant neoplasm of bladder, unspecified: Secondary | ICD-10-CM | POA: Diagnosis not present

## 2017-11-11 DIAGNOSIS — M1990 Unspecified osteoarthritis, unspecified site: Secondary | ICD-10-CM | POA: Insufficient documentation

## 2017-11-11 DIAGNOSIS — Z923 Personal history of irradiation: Secondary | ICD-10-CM | POA: Diagnosis not present

## 2017-11-11 DIAGNOSIS — N3010 Interstitial cystitis (chronic) without hematuria: Secondary | ICD-10-CM | POA: Diagnosis not present

## 2017-11-11 HISTORY — DX: Localized edema: R60.0

## 2017-11-11 HISTORY — DX: Frequency of micturition: R35.0

## 2017-11-11 HISTORY — DX: Urge incontinence: N39.41

## 2017-11-11 HISTORY — DX: Cardiomegaly: I51.7

## 2017-11-11 HISTORY — DX: Urgency of urination: R39.15

## 2017-11-11 HISTORY — DX: Nonrheumatic mitral (valve) insufficiency: I34.0

## 2017-11-11 HISTORY — DX: Obesity, unspecified: E66.9

## 2017-11-11 HISTORY — DX: Presence of dental prosthetic device (complete) (partial): Z97.2

## 2017-11-11 HISTORY — DX: Diverticulosis of intestine, part unspecified, without perforation or abscess without bleeding: K57.90

## 2017-11-11 HISTORY — DX: Rheumatic tricuspid insufficiency: I07.1

## 2017-11-11 HISTORY — DX: Presence of spectacles and contact lenses: Z97.3

## 2017-11-11 HISTORY — DX: Pain in right knee: M25.561

## 2017-11-11 HISTORY — DX: Iron deficiency anemia, unspecified: D50.9

## 2017-11-11 HISTORY — DX: Restless legs syndrome: G25.81

## 2017-11-11 HISTORY — DX: Solitary pulmonary nodule: R91.1

## 2017-11-11 HISTORY — PX: CYSTOSCOPY WITH BIOPSY: SHX5122

## 2017-11-11 LAB — POCT I-STAT, CHEM 8
BUN: 17 mg/dL (ref 8–23)
CREATININE: 0.9 mg/dL (ref 0.44–1.00)
Calcium, Ion: 1.25 mmol/L (ref 1.15–1.40)
Chloride: 106 mmol/L (ref 98–111)
Glucose, Bld: 107 mg/dL — ABNORMAL HIGH (ref 70–99)
HEMATOCRIT: 33 % — AB (ref 36.0–46.0)
HEMOGLOBIN: 11.2 g/dL — AB (ref 12.0–15.0)
Potassium: 4.6 mmol/L (ref 3.5–5.1)
SODIUM: 141 mmol/L (ref 135–145)
TCO2: 26 mmol/L (ref 22–32)

## 2017-11-11 SURGERY — CYSTOSCOPY, WITH BIOPSY
Anesthesia: General | Site: Bladder | Laterality: Bilateral

## 2017-11-11 MED ORDER — SODIUM CHLORIDE 0.9 % IR SOLN
Status: DC | PRN
  Administered 2017-11-11: 3000 mL

## 2017-11-11 MED ORDER — ONDANSETRON HCL 4 MG/2ML IJ SOLN
INTRAMUSCULAR | Status: DC | PRN
  Administered 2017-11-11: 4 mg via INTRAVENOUS

## 2017-11-11 MED ORDER — BELLADONNA ALKALOIDS-OPIUM 16.2-60 MG RE SUPP
RECTAL | Status: AC
  Filled 2017-11-11: qty 1

## 2017-11-11 MED ORDER — STERILE WATER FOR IRRIGATION IR SOLN
Status: DC | PRN
  Administered 2017-11-11: 3000 mL

## 2017-11-11 MED ORDER — DEXAMETHASONE SODIUM PHOSPHATE 10 MG/ML IJ SOLN
INTRAMUSCULAR | Status: DC | PRN
  Administered 2017-11-11: 4 mg via INTRAVENOUS

## 2017-11-11 MED ORDER — FENTANYL CITRATE (PF) 100 MCG/2ML IJ SOLN
25.0000 ug | INTRAMUSCULAR | Status: DC | PRN
  Filled 2017-11-11: qty 1

## 2017-11-11 MED ORDER — ONDANSETRON HCL 4 MG/2ML IJ SOLN
4.0000 mg | Freq: Once | INTRAMUSCULAR | Status: DC | PRN
  Filled 2017-11-11: qty 2

## 2017-11-11 MED ORDER — KETOROLAC TROMETHAMINE 15 MG/ML IJ SOLN
INTRAMUSCULAR | Status: DC | PRN
  Administered 2017-11-11: 15 mg via INTRAVENOUS

## 2017-11-11 MED ORDER — LACTATED RINGERS IV SOLN
INTRAVENOUS | Status: DC
  Administered 2017-11-11: 10:00:00 via INTRAVENOUS
  Filled 2017-11-11: qty 1000

## 2017-11-11 MED ORDER — HYDROCODONE-ACETAMINOPHEN 7.5-325 MG PO TABS
1.0000 | ORAL_TABLET | Freq: Once | ORAL | Status: DC | PRN
  Filled 2017-11-11: qty 1

## 2017-11-11 MED ORDER — FENTANYL CITRATE (PF) 100 MCG/2ML IJ SOLN
INTRAMUSCULAR | Status: AC
  Filled 2017-11-11: qty 2

## 2017-11-11 MED ORDER — LIDOCAINE 2% (20 MG/ML) 5 ML SYRINGE
INTRAMUSCULAR | Status: AC
  Filled 2017-11-11: qty 5

## 2017-11-11 MED ORDER — IOHEXOL 300 MG/ML  SOLN
INTRAMUSCULAR | Status: DC | PRN
  Administered 2017-11-11: 18 mL

## 2017-11-11 MED ORDER — CEFAZOLIN SODIUM-DEXTROSE 1-4 GM/50ML-% IV SOLN
INTRAVENOUS | Status: DC | PRN
  Administered 2017-11-11: 2 g via INTRAVENOUS

## 2017-11-11 MED ORDER — KETOROLAC TROMETHAMINE 30 MG/ML IJ SOLN
INTRAMUSCULAR | Status: AC
  Filled 2017-11-11: qty 1

## 2017-11-11 MED ORDER — MEPERIDINE HCL 25 MG/ML IJ SOLN
6.2500 mg | INTRAMUSCULAR | Status: DC | PRN
  Filled 2017-11-11: qty 1

## 2017-11-11 MED ORDER — PROPOFOL 10 MG/ML IV BOLUS
INTRAVENOUS | Status: DC | PRN
  Administered 2017-11-11: 120 mg via INTRAVENOUS

## 2017-11-11 MED ORDER — PROPOFOL 10 MG/ML IV BOLUS
INTRAVENOUS | Status: AC
  Filled 2017-11-11: qty 20

## 2017-11-11 MED ORDER — CEFAZOLIN SODIUM 1 G IJ SOLR
INTRAMUSCULAR | Status: AC
  Filled 2017-11-11: qty 20

## 2017-11-11 MED ORDER — ONDANSETRON HCL 4 MG/2ML IJ SOLN
INTRAMUSCULAR | Status: AC
  Filled 2017-11-11: qty 2

## 2017-11-11 MED ORDER — LIDOCAINE 2% (20 MG/ML) 5 ML SYRINGE
INTRAMUSCULAR | Status: DC | PRN
  Administered 2017-11-11: 50 mg via INTRAVENOUS

## 2017-11-11 MED ORDER — PHENAZOPYRIDINE HCL 200 MG PO TABS: 200.0000 mg | ORAL_TABLET | Freq: Three times a day (TID) | ORAL | 0 refills | Status: AC | PRN

## 2017-11-11 MED ORDER — DEXAMETHASONE SODIUM PHOSPHATE 10 MG/ML IJ SOLN
INTRAMUSCULAR | Status: AC
  Filled 2017-11-11: qty 1

## 2017-11-11 MED ORDER — METOPROLOL TARTRATE 25 MG PO TABS: ORAL_TABLET | ORAL | 1 refills | Status: DC

## 2017-11-11 SURGICAL SUPPLY — 22 items
BAG DRAIN URO-CYSTO SKYTR STRL (DRAIN) ×3 IMPLANT
CATH URET 5FR 28IN OPEN ENDED (CATHETERS) ×3 IMPLANT
CLOTH BEACON ORANGE TIMEOUT ST (SAFETY) ×3 IMPLANT
ELECT REM PT RETURN 9FT ADLT (ELECTROSURGICAL) ×6
ELECTRODE REM PT RTRN 9FT ADLT (ELECTROSURGICAL) ×2 IMPLANT
GLOVE BIO SURGEON STRL SZ 6.5 (GLOVE) ×2 IMPLANT
GLOVE BIO SURGEON STRL SZ7.5 (GLOVE) ×3 IMPLANT
GLOVE BIO SURGEONS STRL SZ 6.5 (GLOVE) ×1
GLOVE BIOGEL PI IND STRL 7.0 (GLOVE) ×1 IMPLANT
GLOVE BIOGEL PI IND STRL 7.5 (GLOVE) ×2 IMPLANT
GLOVE BIOGEL PI INDICATOR 7.0 (GLOVE) ×2
GLOVE BIOGEL PI INDICATOR 7.5 (GLOVE) ×4
GOWN STRL REUS W/ TWL XL LVL3 (GOWN DISPOSABLE) ×3 IMPLANT
GOWN STRL REUS W/TWL XL LVL3 (GOWN DISPOSABLE) ×6
IV NS IRRIG 3000ML ARTHROMATIC (IV SOLUTION) ×3 IMPLANT
KIT TURNOVER CYSTO (KITS) ×3 IMPLANT
MANIFOLD NEPTUNE II (INSTRUMENTS) ×3 IMPLANT
NS IRRIG 500ML POUR BTL (IV SOLUTION) IMPLANT
PACK CYSTO (CUSTOM PROCEDURE TRAY) ×3 IMPLANT
TUBE CONNECTING 12'X1/4 (SUCTIONS)
TUBE CONNECTING 12X1/4 (SUCTIONS) IMPLANT
WATER STERILE IRR 3000ML UROMA (IV SOLUTION) ×6 IMPLANT

## 2017-11-11 NOTE — Anesthesia Procedure Notes (Signed)
Procedure Name: LMA Insertion Date/Time: 11/11/2017 11:15 AM Performed by: Suan Halter, CRNA Pre-anesthesia Checklist: Patient identified, Emergency Drugs available, Suction available and Patient being monitored Patient Re-evaluated:Patient Re-evaluated prior to induction Oxygen Delivery Method: Circle system utilized Preoxygenation: Pre-oxygenation with 100% oxygen Induction Type: IV induction Ventilation: Mask ventilation without difficulty LMA: LMA inserted LMA Size: 4.0 Number of attempts: 1 Airway Equipment and Method: Bite block Placement Confirmation: positive ETCO2 Tube secured with: Tape Dental Injury: Teeth and Oropharynx as per pre-operative assessment

## 2017-11-11 NOTE — Anesthesia Preprocedure Evaluation (Addendum)
Anesthesia Evaluation  Patient identified by MRN, date of birth, ID band Patient awake    Reviewed: Allergy & Precautions, NPO status , Patient's Chart, lab work & pertinent test results, reviewed documented beta blocker date and time   Airway Mallampati: II  TM Distance: >3 FB Neck ROM: Full    Dental  (+) Upper Dentures, Lower Dentures   Pulmonary pneumonia, resolved, COPD,  COPD inhaler, former smoker,    Pulmonary exam normal breath sounds clear to auscultation       Cardiovascular hypertension, Pt. on medications and Pt. on home beta blockers + Peripheral Vascular Disease  Normal cardiovascular exam+ dysrhythmias Atrial Fibrillation + Valvular Problems/Murmurs MR  Rhythm:Irregular Rate:Normal + Systolic murmurs EKG 0/63/0160- atrial fibrillation Echo 08/25/2016- Left ventricle: The cavity size was normal. Wall thickness was  normal. Systolic function was normal. The estimated ejection fraction was in the range of 50% to 55%. Wall motion was normal; there were no regional wall motion abnormalities. - Mitral valve: Calcified annulus. There was mild regurgitation. - Left atrium: The atrium was moderately dilated. - Right atrium: The atrium was mildly dilated.  Impressions:  - Normal LV systolic function; mild MR; biatrial enlargement; mild   TR.   Neuro/Psych negative neurological ROS  negative psych ROS   GI/Hepatic Neg liver ROS, hiatal hernia, GERD  Medicated and Controlled,  Endo/Other  Obesity Pre diabetes  Renal/GU Renal disease   Bladder Ca    Musculoskeletal  (+) Arthritis , Osteoarthritis,  Osteoporosis   Abdominal (+) + obese,   Peds  Hematology  (+) anemia , Hx/o Blood transfusion No anticoagulation since 05/2017 when she developed hematuria   Anesthesia Other Findings   Reproductive/Obstetrics negative OB ROS                            Anesthesia Physical  Anesthesia  Plan  ASA: III  Anesthesia Plan: General   Post-op Pain Management:    Induction: Intravenous  PONV Risk Score and Plan: 3 and Ondansetron, Dexamethasone and Treatment may vary due to age or medical condition  Airway Management Planned: LMA  Additional Equipment:   Intra-op Plan:   Post-operative Plan: Extubation in OR  Informed Consent: I have reviewed the patients History and Physical, chart, labs and discussed the procedure including the risks, benefits and alternatives for the proposed anesthesia with the patient or authorized representative who has indicated his/her understanding and acceptance.   Dental advisory given  Plan Discussed with: CRNA and Surgeon  Anesthesia Plan Comments:        Anesthesia Quick Evaluation

## 2017-11-11 NOTE — Transfer of Care (Signed)
Immediate Anesthesia Transfer of Care Note  Patient: JORRYN HERSHBERGER  Procedure(s) Performed: Procedure(s) (LRB): CYSTOSCOPY WITH BLADDER BIOPSY BILATERAL RETROGRADE PYELOGRAM (Bilateral)  Patient Location: PACU  Anesthesia Type: General  Level of Consciousness: awake, oriented, sedated and patient cooperative  Airway & Oxygen Therapy: Patient Spontanous Breathing and Patient connected to face mask oxygen  Post-op Assessment: Report given to PACU RN and Post -op Vital signs reviewed and stable  Post vital signs: Reviewed and stable  Complications: No apparent anesthesia complications  Last Vitals:  Vitals Value Taken Time  BP 105/69 11/11/2017 11:51 AM  Temp    Pulse 92 11/11/2017 11:55 AM  Resp 17 11/11/2017 11:55 AM  SpO2 90 % 11/11/2017 11:55 AM  Vitals shown include unvalidated device data.  Last Pain:  Vitals:   11/11/17 0924  TempSrc:   PainSc: 9       Patients Stated Pain Goal: 9 (11/11/17 0924)

## 2017-11-11 NOTE — Op Note (Signed)
Preoperative diagnosis:  1. 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  Status post radiation treatment.  Postoperative diagnosis:  1. Same  Procedure: 1. Cystoscopy, bilateral retrograde pyelogram with interpretation 2. Bladder biopsy and fulguration, 5 mm  Surgeon: Ardis Hughs, MD  Anesthesia: General  Complications: None  Intraoperative findings:  #1: The patient's bladder looked remarkably good.  There was some scar tissue at the right lateral wall/dome from the previous resection and radiation.  There were no residual tumor fragments or other identifiable abnormal lesions. #2: The left retrograde pyelogram was performed using 10 cc of Omnipaque contrast demonstrating a normal caliber ureter with no filling defects, hydronephrosis, or significant abnormality. 3.:  The right retrograde pyelogram was performed using 10 cc of Omnipaque contrast and demonstrated a normal caliber ureter with no significant filling defects, the calyces were sharp, there is no hydronephrosis or other abnormalities. 4.:  2 small biopsy specimens were obtained from the scar tissue at the bladder dome on the right lateral aspect.  EBL: Minimal  Specimens: Scar tissue was biopsied with a cold cup biopsy forcep and sent for permanent pathology.  Indication: QUANTIA GRULLON is a 84 y.o. patient with history of poorly differentiated high-grade muscle invasive bladder cancer consistent with small cell carcinoma.  She underwent radiation therapy as treatment, and presents today for follow-up surveillance..  After reviewing the management options for treatment, he elected to proceed with the above surgical procedure(s). We have discussed the potential benefits and risks of the procedure, side effects of the proposed treatment, the likelihood of the patient achieving the goals of the procedure, and any potential problems that might occur during the  procedure or recuperation. Informed consent has been obtained.  Description of procedure:  The patient was taken to the operating room and general anesthesia was induced.  The patient was placed in the dorsal lithotomy position, prepped and draped in the usual sterile fashion, and preoperative antibiotics were administered. A preoperative time-out was performed.   21 French 30 degrees cystoscope was gently passed through the patient's urethra and the bladder under visual guidance.  360 degrees cystoscopic evaluation was performed with the above findings.  I then performed a bilateral retrograde pyelogram as described above with the above findings.  I then swapped the bridge for a cold cup biopsy forceps and biopsied the scar tissue.  I then fulgurated these areas copiously.  At the end of the case there was no ongoing bleeding.  The bladder was emptied and a B&O suppository was placed in the patient's rectum.  She was subsequently extubated return the PACU in good condition.  Ardis Hughs, M.D.

## 2017-11-11 NOTE — Discharge Instructions (Signed)
°Post Anesthesia Home Care Instructions ° °Activity: °Get plenty of rest for the remainder of the day. A responsible individual must stay with you for 24 hours following the procedure.  °For the next 24 hours, DO NOT: °-Drive a car °-Operate machinery °-Drink alcoholic beverages °-Take any medication unless instructed by your physician °-Make any legal decisions or sign important papers. ° °Meals: °Start with liquid foods such as gelatin or soup. Progress to regular foods as tolerated. Avoid greasy, spicy, heavy foods. If nausea and/or vomiting occur, drink only clear liquids until the nausea and/or vomiting subsides. Call your physician if vomiting continues. ° °Special Instructions/Symptoms: °Your throat may feel dry or sore from the anesthesia or the breathing tube placed in your throat during surgery. If this causes discomfort, gargle with warm salt water. The discomfort should disappear within 24 hours. ° °If you had a scopolamine patch placed behind your ear for the management of post- operative nausea and/or vomiting: ° °1. The medication in the patch is effective for 72 hours, after which it should be removed.  Wrap patch in a tissue and discard in the trash. Wash hands thoroughly with soap and water. °2. You may remove the patch earlier than 72 hours if you experience unpleasant side effects which may include dry mouth, dizziness or visual disturbances. °3. Avoid touching the patch. Wash your hands with soap and water after contact with the patch. °  °Transurethral Resection of Bladder Tumor (TURBT) or Bladder Biopsy ° ° °Definition: ° Transurethral Resection of the Bladder Tumor is a surgical procedure used to diagnose and remove tumors within the bladder. TURBT is the most common treatment for early stage bladder cancer. ° °General instructions: °   ° Your recent bladder surgery requires very little post hospital care but some definite precautions. ° °Despite the fact that no skin incisions were used,  the area around the bladder incisions are raw and covered with scabs to promote healing and prevent bleeding. Certain precautions are needed to insure that the scabs are not disturbed over the next 2-4 weeks while the healing proceeds. ° °Because the raw surface inside your bladder and the irritating effects of urine you may expect frequency of urination and/or urgency (a stronger desire to urinate) and perhaps even getting up at night more often. This will usually resolve or improve slowly over the healing period. You may see some blood in your urine over the first 6 weeks. Do not be alarmed, even if the urine was clear for a while. Get off your feet and drink lots of fluids until clearing occurs. If you start to pass clots or don't improve call us. ° °Diet: ° °You may return to your normal diet immediately. Because of the raw surface of your bladder, alcohol, spicy foods, foods high in acid and drinks with caffeine may cause irritation or frequency and should be used in moderation. To keep your urine flowing freely and avoid constipation, drink plenty of fluids during the day (8-10 glasses). Tip: Avoid cranberry juice because it is very acidic. ° °Activity: ° °Your physical activity doesn't need to be restricted. However, if you are very active, you may see some blood in the urine. We suggest that you reduce your activity under the circumstances until the bleeding has stopped. ° °Bowels: ° °It is important to keep your bowels regular during the postoperative period. Straining with bowel movements can cause bleeding. A bowel movement every other day is reasonable. Use a mild laxative if needed, such as   milk of magnesia 2-3 tablespoons, or 2 Dulcolax tablets. Call if you continue to have problems. If you had been taking narcotics for pain, before, during or after your surgery, you may be constipated. Take a laxative if necessary. ° ° ° °Medication: ° °You should resume your pre-surgery medications unless told not  to. In addition you may be given an antibiotic to prevent or treat infection. Antibiotics are not always necessary. All medication should be taken as prescribed until the bottles are finished unless you are having an unusual reaction to one of the drugs. ° ° ° ° ° ° °

## 2017-11-11 NOTE — H&P (Signed)
f/u for transitional cell carcinoma  HPI: Kristen Macias is a 84 year-old female established patient who is here for surveillance of bladder cancer.  She underwent a TURBT. Her last bladder tumor was resected approximately 04/22/2017. She has had the a total of 1 bladder resections. The patient had their first TURBT in approximately 04/22/2017.   Tumor Pathology: HG invasive small cell carcinoma of the bladder.   The patient follows up today, 2 weeks status post TURBT for further discussion and management strategies for her bladder cancer.   The patient was presented to tumor board, and given the aggressive nature of her cancer was recommended that she consider chemotherapy and radiation. Given the patient's otherwise frail status, their recommendation was to initially began with intent to cure with a low threshold to transition towards palliation.   Interval: Today the patient is doing great. She has some urinary frequency and nocturia, but denies any dysuria or hematuria. She feels well. She denies any new bone or back pain. She denies any associated signs or symptoms. Her weight is stable if not slightly higher than previously. Her energy level is excellent.     ALLERGIES: None   MEDICATIONS: Metoprolol Tartrate  Omeprazole  Ultram 50 mg tablet 1-2 tablet PO Q 6 H  Atorvastatin Calcium     GU PSH: Cystoscopy - 03/29/2017    NON-GU PSH: None   GU PMH: Bladder Cancer Anterior - 05/06/2017 Bladder Cancer Dome - 03/29/2017    NON-GU PMH: GERD    FAMILY HISTORY: None   SOCIAL HISTORY: Marital Status: Single Preferred Language: English; Race: Declined to Specify Current Smoking Status: Patient smokes.   Tobacco Use Assessment Completed: Used Tobacco in last 30 days? Has never drank.  Drinks 3 caffeinated drinks per day.    REVIEW OF SYSTEMS:    GU Review Female:   Patient denies frequent urination, hard to postpone urination, burning /pain with urination, get up at night to urinate,  leakage of urine, stream starts and stops, trouble starting your stream, have to strain to urinate, and being pregnant.  Gastrointestinal (Upper):   Patient denies nausea, vomiting, and indigestion/ heartburn.  Gastrointestinal (Lower):   Patient denies diarrhea and constipation.  Constitutional:   Patient denies night sweats, fever, weight loss, and fatigue.  Skin:   Patient denies skin rash/ lesion and itching.  Eyes:   Patient denies blurred vision and double vision.  Ears/ Nose/ Throat:   Patient denies sore throat and sinus problems.  Hematologic/Lymphatic:   Patient denies swollen glands and easy bruising.  Cardiovascular:   Patient denies leg swelling and chest pains.  Respiratory:   Patient denies cough and shortness of breath.  Endocrine:   Patient denies excessive thirst.  Musculoskeletal:   Patient denies back pain and joint pain.  Neurological:   Patient denies headaches and dizziness.  Psychologic:   Patient denies depression and anxiety.   VITAL SIGNS:      10/19/2017 11:29 AM  Weight 163 lb / 73.94 kg  Height 63 in / 160.02 cm  BP 123/84 mmHg  Pulse 86 /min  BMI 28.9 kg/m   MULTI-SYSTEM PHYSICAL EXAMINATION:    Constitutional: Well-nourished. No physical deformities. Normally developed. Good grooming.  Respiratory: No labored breathing, no use of accessory muscles.   Cardiovascular: Normal temperature, normal extremity pulses, no swelling, no varicosities.  Musculoskeletal: Normal gait and station of head and neck.     PAST DATA REVIEWED:  Source Of History:  Patient   PROCEDURES:  Urinalysis Dipstick Dipstick Cont'd  Color: Yellow Bilirubin: Neg mg/dL  Appearance: Clear Ketones: Neg mg/dL  Specific Gravity: 1.010 Blood: Neg ery/uL  pH: 6.0 Protein: Trace mg/dL  Glucose: Neg mg/dL Urobilinogen: 0.2 mg/dL    Nitrites: Neg    Leukocyte Esterase: Neg leu/uL    ASSESSMENT:      ICD-10 Details  1 GU:   Bladder Cancer overlapping sites - C67.8     PLAN:           Schedule Return Visit/Planned Activity: ASAP - Schedule Surgery          Document Letter(s):  Created for Patient: Clinical Summary         Notes:   The patient has a history of small cell bladder cancer. This was treated with radiation, with intent to cure, which ended 3 months ago. At this point, our plan is to perform cystoscopy under anesthesia with bladder biopsies and retrograde pyelograms. I went through the surgery with the patient in significant detail, and answered all her questions. We discussed the risks and the benefits of the operation. We'll plan to get this taken care of as soon as we can

## 2017-11-11 NOTE — Anesthesia Postprocedure Evaluation (Signed)
Anesthesia Post Note  Patient: Kristen Macias  Procedure(s) Performed: CYSTOSCOPY WITH BLADDER BIOPSY BILATERAL RETROGRADE PYELOGRAM (Bilateral Bladder)     Patient location during evaluation: PACU Anesthesia Type: General Level of consciousness: awake and alert and oriented Pain management: pain level controlled Vital Signs Assessment: post-procedure vital signs reviewed and stable Respiratory status: spontaneous breathing, nonlabored ventilation and respiratory function stable Cardiovascular status: blood pressure returned to baseline and stable Postop Assessment: no apparent nausea or vomiting Anesthetic complications: no    Last Vitals:  Vitals:   11/11/17 1151 11/11/17 1215  BP: 105/69 (!) 103/59  Pulse: 86 98  Resp: 14 12  Temp: (!) 36.3 C   SpO2: (!) 88% 94%    Last Pain:  Vitals:   11/11/17 1215  TempSrc:   PainSc: 7                  Lakita Sahlin A.

## 2017-11-12 ENCOUNTER — Encounter (HOSPITAL_BASED_OUTPATIENT_CLINIC_OR_DEPARTMENT_OTHER): Payer: Self-pay | Admitting: Urology

## 2017-11-23 NOTE — Progress Notes (Signed)
  Subjective:   Patient ID: Kristen Macias    DOB: July 27, 1933, 84 y.o. female   MRN: [id number]  Kristen Macias is a 84 y.o. female with a history of afib, GERD, osteoporosis, bladder cancer, prediabetes, HLD here for   Bladder Cancer - invasive small cell carcinoma, followed by Oncology. S/p TURBT. Underwent cystoscopy with bladder biopsy 11/11/17, tolerated well. Has follow up appointment tomorrow. - PET scan without mets 05/2017 - s/p radiation  Prediabetes - last A1c 6.1 04/2016 - no polydipsia - eats lots of fruits, not many vegetables. Walks frequently at home.   Atrial fibrillation - metoprolol 25mg  BID - EKG 9/19 with afib, rate controlled. - Denies CP, SOB, palpitations  GERD - taking omeprazole 20mg  BID  Review of Systems:  Per HPI.  [place], medications and smoking status reviewed.  Objective:   BP 106/64   Pulse 65   Temp 98 F (36.7 C) (Oral)   Wt 185 lb (83.9 kg)   SpO2 97%   BMI 32.77 kg/m  Vitals and nursing note reviewed.  General: elderly overweight female, in no acute distress with non-toxic appearance HEENT: normocephalic, atraumatic, moist mucous membranes CV: irregularly irregular rhythm, regular rate without murmurs, rubs, or gallops, no lower extremity edema Lungs: clear to auscultation bilaterally with normal work of breathing Abdomen: soft, non-tender, non-distended, normoactive bowel sounds Skin: warm, dry, no rashes or lesions Extremities: warm and well perfused, normal tone MSK: ROM grossly intact, strength intact, gait normal Neuro: Alert and oriented, speech normal  Assessment & Plan:   Atrial fibrillation (HCC) In afib on exam, rate controlled with metoprolol. Continue current regimen. Advised to follow up with Cardiologist at her convenience for check up.  GERD (gastroesophageal reflux disease) Controlled on omeprazole. Will trial ranitidine given less risk of bone fracture and pneumonia. Advised to call if not controlling symptoms  well.  Prediabetes A1c 6.1 today. Reviewed lifestyle modifications with diet and exercise. Encouraged increasing vegetable intake to incorporate at least one vegetable at each meal. Encouraged increasing distance and time of walking.  Healthcare Maintenance Rx given for tetanus and Shingrix vaccines. Flu vaccine administered today. UTD on mammogram, DEXA.  Orders Placed This Encounter  Procedures  . Flu Vaccine QUAD 36+ mos IM  . HgB A1c   Meds ordered this encounter  Medications  . ranitidine (ZANTAC) 150 MG tablet    Sig: Take 1 tablet (150 mg total) by mouth 2 (two) times daily.    Dispense:  180 tablet    Refill:  3  . Tdap (BOOSTRIX) 5-2.5-18.5 LF-MCG/0.5 injection    Sig: Inject 0.5 mLs into the muscle once for 1 dose.    Dispense:  0.5 mL    Refill:  0  . Zoster Vaccine Adjuvanted [facility]) injection    Sig: Inject 0.5 mLs into the muscle once for 1 dose.    Dispense:  0.5 mL    Refill:  0    Ok to give second administration.    Rory Percy, DO PGY-2, [facility] 11/24/2017 10:58 AM

## 2017-11-24 ENCOUNTER — Encounter: Payer: Self-pay | Admitting: Family Medicine

## 2017-11-24 ENCOUNTER — Ambulatory Visit (INDEPENDENT_AMBULATORY_CARE_PROVIDER_SITE_OTHER): Payer: Medicare HMO | Admitting: Family Medicine

## 2017-11-24 ENCOUNTER — Other Ambulatory Visit: Payer: Self-pay

## 2017-11-24 VITALS — BP 106/64 | HR 65 | Temp 98.0°F | Wt 185.0 lb

## 2017-11-24 DIAGNOSIS — Z23 Encounter for immunization: Secondary | ICD-10-CM

## 2017-11-24 DIAGNOSIS — K2100 Gastro-esophageal reflux disease with esophagitis, without bleeding: Secondary | ICD-10-CM

## 2017-11-24 DIAGNOSIS — I4811 Longstanding persistent atrial fibrillation: Secondary | ICD-10-CM | POA: Diagnosis not present

## 2017-11-24 DIAGNOSIS — R7309 Other abnormal glucose: Secondary | ICD-10-CM | POA: Diagnosis not present

## 2017-11-24 DIAGNOSIS — R7303 Prediabetes: Secondary | ICD-10-CM

## 2017-11-24 LAB — POCT GLYCOSYLATED HEMOGLOBIN (HGB A1C): HBA1C, POC (CONTROLLED DIABETIC RANGE): 6.1 % (ref 0.0–7.0)

## 2017-11-24 MED ORDER — RANITIDINE HCL 150 MG PO TABS: 150.0000 mg | ORAL_TABLET | Freq: Two times a day (BID) | ORAL | 3 refills | Status: DC

## 2017-11-24 MED ORDER — ZOSTER VAC RECOMB ADJUVANTED 50 MCG/0.5ML IM SUSR: 0.5000 mL | Freq: Once | INTRAMUSCULAR | 0 refills | Status: AC

## 2017-11-24 MED ORDER — TETANUS-DIPHTH-ACELL PERTUSSIS 5-2.5-18.5 LF-MCG/0.5 IM SUSP: 0.5000 mL | Freq: Once | INTRAMUSCULAR | 0 refills | Status: AC

## 2017-11-24 NOTE — Assessment & Plan Note (Signed)
In afib on exam, rate controlled with metoprolol. Continue current regimen. Advised to follow up with Cardiologist at her convenience for check up.

## 2017-11-24 NOTE — Assessment & Plan Note (Signed)
Controlled on omeprazole. Will trial ranitidine given less risk of bone fracture and pneumonia. Advised to call if not controlling symptoms well.

## 2017-11-24 NOTE — Assessment & Plan Note (Addendum)
A1c 6.1 today. Reviewed lifestyle modifications with diet and exercise. Encouraged increasing vegetable intake to incorporate at least one vegetable at each meal. Encouraged increasing distance and time of walking.

## 2017-11-24 NOTE — Patient Instructions (Addendum)
It was great to see you!  Our plans for today:  - Aim for increasing the amount of vegetables with each meal. - Keep increasing your activity level through walking and other things you like to do. - We are changing your prilosec to a similar medication that doesn't have as much risk for bone fracture or pneumonia. Let us know if this doesn't control your symptoms as well. - I am also providing prescriptions for the tetanus and shingles vaccines. Take these to your preferred pharmacy.  Take care and seek immediate care sooner if you develop any concerns.   Dr. [facility]

## 2017-11-25 DIAGNOSIS — C671 Malignant neoplasm of dome of bladder: Secondary | ICD-10-CM | POA: Diagnosis not present

## 2017-11-30 ENCOUNTER — Other Ambulatory Visit: Payer: Self-pay | Admitting: *Deleted

## 2017-11-30 NOTE — Patient Outreach (Signed)
[place] [facility]) Care Management  11/30/2017   Kristen Macias 17-Jan-1934 [id number]  RN Health Coach telephone call to patient.  [employer] compliance verified. Per patient she is in the green zone of Copd. Patient has a slight cough of clear sputum. Patient has finished all treatment for her bladder cancer and reported that she is cancer free. Patient stated she is walking to the mailbox each day for her exercise that takes about 5 minutes . Patient has received flu, shingles and tetanus vaccine. Per patient she will follow up on getting eye exam. Patient has agreed to follow up outreach calls.  Current Medications:  Current Outpatient Medications  Medication Sig Dispense Refill  . acetaminophen (TYLENOL) 500 MG tablet Take 500 mg by mouth every 6 (six) hours as needed.    Marland Kitchen albuterol (VENTOLIN HFA) 108 (90 Base) MCG/ACT inhaler INHALE 1 TO 2 PUFFS INTO THE LUNGS EVERY 6 (SIX) HOURS AS NEEDED FOR WHEEZING OR SHORTNESS OF BREATH. 18 g 2  . alendronate (FOSAMAX) 70 MG tablet TAKE 1 TABLET ONE TIME WEEKLY ON AN EMPTY STOMACH  WITH  FULL  GLASS  OF  WATER 12 tablet 0  . Calcium Carb-Cholecalciferol (CALCIUM-VITAMIN D) 600-400 MG-UNIT TABS Take 2 tablets by mouth daily. (Patient taking differently: Take 0.5 tablets by mouth daily. ) 180 tablet 0  . metoprolol tartrate (LOPRESSOR) 25 MG tablet TAKE ONE TABLET (25 MG) BY MOUTH TWICE DAILY 180 tablet 1  . Multiple Vitamin (MULTIVITAMIN WITH MINERALS) TABS tablet Take 1 tablet by mouth daily. Centrum Silver    . Naproxen Sod-diphenhydrAMINE (ALEVE PM PO) Take by mouth.    . phenazopyridine (PYRIDIUM) 200 MG tablet Take 1 tablet (200 mg total) by mouth 3 (three) times daily as needed for pain. 10 tablet 0  . polyethylene glycol powder (GLYCOLAX/MIRALAX) powder Take 17 g by mouth daily. (Patient taking differently: Take 17 g by mouth as needed. ) 3350 g 1  . ranitidine (ZANTAC) 150 MG tablet Take 1 tablet (150 mg total) by mouth 2 (two) times  daily. 180 tablet 3  . tiotropium (SPIRIVA HANDIHALER) 18 MCG inhalation capsule INHALE THE CONTENTS OF 1 CAPSULE EVERY DAY 90 capsule 2   No current facility-administered medications for this visit.     Functional Status:  In your present state of health, do you have any difficulty performing the following activities: 11/11/2017 06/08/2017  Hearing? N N  Vision? N N  Difficulty concentrating or making decisions? N N  Walking or climbing stairs? Y N  Dressing or bathing? N N  Doing errands, shopping? - Scientist, forensic and eating ? - N  Using the Toilet? - N  In the past six months, have you accidently leaked urine? - N  Do you have problems with loss of bowel control? - N  Managing your Medications? - N  Managing your Finances? - -  Housekeeping or managing your Housekeeping? - N  Some recent data might be hidden    Fall/Depression Screening: Fall Risk  11/30/2017 11/24/2017 08/20/2017  Falls in the past year? No No No  Number falls in past yr: - - -  Injury with Fall? - - -  Risk for fall due to : - - -   PHQ 2/9 Scores 11/30/2017 11/24/2017 08/20/2017 08/18/2017 06/18/2017 06/08/2017 05/31/2017  PHQ - 2 Score 0 0 1 0 0 0 0   [facility] CM Care Plan Problem One     Most Recent Value  Care Plan Problem One  knowledge deficit in Self management of COPD  Role Documenting the Problem One  Health Coach  Care Plan for Problem One  Active  [facility] Long Term Goal   Patient will have have any admissions for COPD within the next 90 days  [facility] Long Term Goal Start Date  11/30/17  Interventions for Problem One Long Term Goal  RN reiterated medication usage and zones with action plan  [facility] CM Short Term Goal #1   Patient will be able to verbalize symptoms of community acquired pneumonia, acute bronchitis and cough  [facility] CM Short Term Goal #1 Start Date  11/30/17  Interventions for Short Term Goal #1  RN sent patient educational material on acute bronchitis, cough and pneumonia. RN will follow up with further  discussion and teach back  [facility] CM Short Term Goal #2   Patient will verbalize following up on health maintenance within the next 30 days  [facility] CM Short Term Goal #2 Start Date  11/30/17  Interventions for Short Term Goal #2  RN discussed flu shot in which patient received, shingle shot, tetanus and eye exam. RN will follow up for compliance      Assessment:  No recent falls Patient is in green zone Patient has received flu, shingle, and tetanus vaccine Patient is taking medication as prescribed Plan:  Patient will schedule eye exam Patient has scheduled CT scan on 15th RN sent educational material on COPD, CAP, and bronchitis RN will follow up within the month of December  [name] [place] [facility] [phone]

## 2017-12-07 DIAGNOSIS — K8020 Calculus of gallbladder without cholecystitis without obstruction: Secondary | ICD-10-CM | POA: Diagnosis not present

## 2017-12-07 DIAGNOSIS — R918 Other nonspecific abnormal finding of lung field: Secondary | ICD-10-CM | POA: Diagnosis not present

## 2017-12-07 DIAGNOSIS — Z8551 Personal history of malignant neoplasm of bladder: Secondary | ICD-10-CM | POA: Diagnosis not present

## 2017-12-07 DIAGNOSIS — K5790 Diverticulosis of intestine, part unspecified, without perforation or abscess without bleeding: Secondary | ICD-10-CM | POA: Diagnosis not present

## 2017-12-07 DIAGNOSIS — C678 Malignant neoplasm of overlapping sites of bladder: Secondary | ICD-10-CM | POA: Diagnosis not present

## 2017-12-15 ENCOUNTER — Other Ambulatory Visit: Payer: Self-pay | Admitting: Family Medicine

## 2018-01-23 ENCOUNTER — Other Ambulatory Visit: Payer: Self-pay | Admitting: Family Medicine

## 2018-01-27 ENCOUNTER — Other Ambulatory Visit: Payer: Self-pay | Admitting: Family Medicine

## 2018-01-31 ENCOUNTER — Other Ambulatory Visit: Payer: Self-pay | Admitting: *Deleted

## 2018-01-31 NOTE — Patient Outreach (Signed)
[place] [facility]) Care Management  01/31/2018  Kristen Macias 08-21-1933 [id number]   Covering for assigned Health Coach, Joaquim [place]:  RNCM called to follow up. Client expresses that she is in the green zone. Client denies any issues at this time.  Plan: follow up within the next 2-3 months.  [facility] CM Care Plan Problem One     Most Recent Value  Care Plan Problem One  knowledge deficit in Self management of COPD  Role Documenting the Problem One  Health Coach  Care Plan for Problem One  Active  [facility] Long Term Goal   Patient will have have any admissions for COPD within the next 90 days  [facility]N Long Term Goal Start Date  11/30/17  [facility] CM Short Term Goal #1   Patient will be able to verbalize symptoms of community acquired pneumonia, acute bronchitis and cough  [facility] CM Short Term Goal #1 Start Date  11/30/17  Interventions for Short Term Goal #1  RNCM reviewed signs/symptoms and actions associated with the yellow zone and red zone  THN CM Short Term Goal #2   Patient will verbalize following up on health maintenance within the next 30 days  [facility] CM Short Term Goal #2 Start Date  01/31/18  Interventions for Short Term Goal #2  reports she received a flu shot in september and has an appointment with her oncologist on January 21.     Covering RNCM for Health Coach, [place]: Thea Silversmith, RN, MSN, [place] Coordinator Cell: [phone]

## 2018-02-28 DIAGNOSIS — C671 Malignant neoplasm of dome of bladder: Secondary | ICD-10-CM | POA: Diagnosis not present

## 2018-03-02 ENCOUNTER — Other Ambulatory Visit: Payer: Self-pay | Admitting: Family Medicine

## 2018-03-03 ENCOUNTER — Other Ambulatory Visit: Payer: Self-pay | Admitting: Family Medicine

## 2018-03-07 ENCOUNTER — Ambulatory Visit: Payer: Medicare HMO | Admitting: Family Medicine

## 2018-03-10 DIAGNOSIS — C678 Malignant neoplasm of overlapping sites of bladder: Secondary | ICD-10-CM | POA: Diagnosis not present

## 2018-03-10 DIAGNOSIS — R918 Other nonspecific abnormal finding of lung field: Secondary | ICD-10-CM | POA: Diagnosis not present

## 2018-03-10 DIAGNOSIS — Z8551 Personal history of malignant neoplasm of bladder: Secondary | ICD-10-CM | POA: Diagnosis not present

## 2018-03-10 DIAGNOSIS — R911 Solitary pulmonary nodule: Secondary | ICD-10-CM | POA: Diagnosis not present

## 2018-03-15 DIAGNOSIS — C673 Malignant neoplasm of anterior wall of bladder: Secondary | ICD-10-CM | POA: Diagnosis not present

## 2018-04-06 ENCOUNTER — Other Ambulatory Visit: Payer: Self-pay | Admitting: Family Medicine

## 2018-04-06 NOTE — Telephone Encounter (Signed)
Refill provided, please have patient make appt for BP whenever available.

## 2018-05-02 ENCOUNTER — Other Ambulatory Visit: Payer: Self-pay | Admitting: *Deleted

## 2018-05-02 NOTE — Patient Outreach (Signed)
[facility]) Care Management  05/02/2018   MOZELLE REMLINGER 12/20/1933 [id number]  RN Health Coach telephone call to patient.  Hipaa compliance verified. Per patient she is doing good.Patietn is in the green zone of COPD.  Patient is ambulating without a walker or cane. Per patient she is taking all medications as prescribed. Patient stated she is able to afford medications. Per patient her appetite is good.  Per patient the problem she is having is with her food stamps being cut from 130.00 to 55.00. Per patient she only has her social security that is under 800.00 a month to live off of. Patient is having to pay a cab for transportation to and from grocery store. Per patient her sister has been taking her to her Dr appointments and when she can't she has used the [employer] transportation. Patient stated that her son has been helping her out some with some money for food. Patient has agreed to follow up outreach calls.   Current Medications:  Current Outpatient Medications  Medication Sig Dispense Refill  . albuterol (VENTOLIN HFA) 108 (90 Base) MCG/ACT inhaler INHALE 1 TO 2 PUFFS INTO THE LUNGS EVERY 6 (SIX) HOURS AS NEEDED FOR WHEEZING OR SHORTNESS OF BREATH. 18 g 2  . ranitidine (ZANTAC) 150 MG tablet Take 1 tablet (150 mg total) by mouth 2 (two) times daily. 180 tablet 3  . tiotropium (SPIRIVA HANDIHALER) 18 MCG inhalation capsule INHALE THE CONTENTS OF 1 CAPSULE EVERY DAY 90 capsule 2  . acetaminophen (TYLENOL) 500 MG tablet Take 500 mg by mouth every 6 (six) hours as needed.    Marland Kitchen alendronate (FOSAMAX) 70 MG tablet TAKE 1 TABLET ONE TIME WEEKLY ON AN EMPTY STOMACH  WITH  FULL  GLASS  OF  WATER 12 tablet 0  . Calcium Carb-Cholecalciferol (CALCIUM-VITAMIN D) 600-400 MG-UNIT TABS Take 2 tablets by mouth daily. (Patient taking differently: Take 0.5 tablets by mouth daily. ) 180 tablet 0  . metoprolol tartrate (LOPRESSOR) 25 MG tablet TAKE ONE TABLET (25 MG) BY MOUTH TWICE DAILY 180  tablet 0  . Multiple Vitamin (MULTIVITAMIN WITH MINERALS) TABS tablet Take 1 tablet by mouth daily. Centrum Silver    . Naproxen Sod-diphenhydrAMINE (ALEVE PM PO) Take by mouth.    . phenazopyridine (PYRIDIUM) 200 MG tablet Take 1 tablet (200 mg total) by mouth 3 (three) times daily as needed for pain. (Patient not taking: Reported on 05/02/2018) 10 tablet 0  . polyethylene glycol powder (GLYCOLAX/MIRALAX) powder Take 17 g by mouth daily. (Patient taking differently: Take 17 g by mouth as needed. ) 3350 g 1   No current facility-administered medications for this visit.     Functional Status:  In your present state of health, do you have any difficulty performing the following activities: 05/02/2018 11/11/2017  Hearing? N N  Vision? N N  Difficulty concentrating or making decisions? N N  Walking or climbing stairs? N Y  Dressing or bathing? N N  Doing errands, shopping? N -  Preparing Food and eating ? N -  Using the Toilet? N -  In the past six months, have you accidently leaked urine? N -  Do you have problems with loss of bowel control? N -  Managing your Medications? N -  Managing your Finances? N -  Housekeeping or managing your Housekeeping? N -  Some recent data might be hidden    Fall/Depression Screening: Fall Risk  05/02/2018 11/30/2017 11/24/2017  Falls in the past year? 0 No No  Number falls in past yr: - - -  Injury with Fall? - - -  Risk for fall due to : - - -   PHQ 2/9 Scores 05/02/2018 11/30/2017 11/24/2017 08/20/2017 08/18/2017 06/18/2017 06/08/2017  PHQ - 2 Score 0 0 0 1 0 0 0    Assessment:  Patient is in green zone Patient is having financial difficulties with food Patient has follow up appointment for bladder cancer Plan:  Patient will follow up with oncology for bladder CA RN sent a 2020 calendar book for documentation of appointments Patient will follow up with eye exam after bladder Ca exam Referred to social worker RN will follow up within the month of June RN  sent additional educational material on COPD exacerbation  [place] Management [phone]

## 2018-05-07 ENCOUNTER — Other Ambulatory Visit: Payer: Self-pay | Admitting: Family Medicine

## 2018-05-09 ENCOUNTER — Other Ambulatory Visit: Payer: Self-pay | Admitting: *Deleted

## 2018-05-09 ENCOUNTER — Encounter: Payer: Self-pay | Admitting: *Deleted

## 2018-05-09 NOTE — Patient Outreach (Signed)
[place] [facility]) Care Management  05/09/2018  Kristen Macias 1934-01-08 [id number]   CSW attempted initial phone outreach to pt today unsuccessfully. CSW will mail pt an unsuccessful outreach letter and attempt again in 3-4 business days.   Eduard Clos, MSW, [place] Worker  [place] [phone]

## 2018-05-13 ENCOUNTER — Other Ambulatory Visit: Payer: Self-pay | Admitting: *Deleted

## 2018-05-13 ENCOUNTER — Ambulatory Visit: Payer: Self-pay | Admitting: *Deleted

## 2018-05-13 NOTE — Patient Outreach (Signed)
[place] [facility]) Care Management  05/13/2018  Kristen Macias 1933/06/24 [id number]   CSW received return call from pt who confirmed her identity. Pt reports she has been able to get her Food Stamps worked out and expects to receive them in APril.   CSW will sign off and advised pt to call if needs arise.   Eduard Clos, MSW, [place] Worker  [place] [phone]

## 2018-05-13 NOTE — Patient Outreach (Addendum)
[place] [facility]) Care Management  05/13/2018  [name] [birth year] [id number]   CSW attempted to contact pt again today without success. CSW left a HIPPA compliant voice message and will try again within our 10 day policy if no call back.      Eduard Clos, MSW, [place] Worker  [place] [phone]

## 2018-05-16 ENCOUNTER — Other Ambulatory Visit: Payer: Self-pay | Admitting: Family Medicine

## 2018-05-16 DIAGNOSIS — J449 Chronic obstructive pulmonary disease, unspecified: Secondary | ICD-10-CM

## 2018-05-18 ENCOUNTER — Other Ambulatory Visit: Payer: Self-pay | Admitting: Family Medicine

## 2018-05-23 ENCOUNTER — Ambulatory Visit: Payer: Self-pay | Admitting: *Deleted

## 2018-06-02 DIAGNOSIS — C673 Malignant neoplasm of anterior wall of bladder: Secondary | ICD-10-CM | POA: Diagnosis not present

## 2018-06-06 ENCOUNTER — Other Ambulatory Visit: Payer: Self-pay | Admitting: Family Medicine

## 2018-06-07 ENCOUNTER — Other Ambulatory Visit: Payer: Self-pay

## 2018-06-07 ENCOUNTER — Telehealth (INDEPENDENT_AMBULATORY_CARE_PROVIDER_SITE_OTHER): Payer: Medicare HMO | Admitting: Family Medicine

## 2018-06-07 DIAGNOSIS — R911 Solitary pulmonary nodule: Secondary | ICD-10-CM | POA: Diagnosis not present

## 2018-06-07 DIAGNOSIS — C678 Malignant neoplasm of overlapping sites of bladder: Secondary | ICD-10-CM | POA: Diagnosis not present

## 2018-06-07 DIAGNOSIS — R1310 Dysphagia, unspecified: Secondary | ICD-10-CM | POA: Diagnosis not present

## 2018-06-07 DIAGNOSIS — R1319 Other dysphagia: Secondary | ICD-10-CM

## 2018-06-07 DIAGNOSIS — K449 Diaphragmatic hernia without obstruction or gangrene: Secondary | ICD-10-CM | POA: Diagnosis not present

## 2018-06-07 MED ORDER — OMEPRAZOLE 40 MG PO CPDR: 40.0000 mg | DELAYED_RELEASE_CAPSULE | Freq: Every day | ORAL | 3 refills | Status: DC

## 2018-06-07 NOTE — Assessment & Plan Note (Signed)
Seems most related to heart burn reflux.  No red flags of food sticking or weight loss yet.  Asked her to stop:  Aleve, Fosamax and Ranitidine and to start Omeprazole.   If not better in one week or if worsening in any way to call us.  She agrees.  If does not completely resolve may need endoscopy

## 2018-06-07 NOTE — Progress Notes (Addendum)
Subjective  JAKEYA GHERARDI is a 85 y.o. female I  [place] Telemedicine Visit  Patient consented to have virtual visit. Method of visit: telephone  Encounter participants: Patient: BERNARDETTE WALDRON - located at home Provider: Lind Covert - located at office Others (if applicable): no  Chief Complaint: Pain with swallowing  HPI: For about a week.  No sudden onset or any choking.  Feels like her heartburn is getting bad.  Does hurt to swallow pills and eat but is getting things down.  No vomiting.  No weight loss.  No chest pain with exertion.  Never had this before Is taking her ranitidine regularly.  No bleeding   ROS: per HPI  Pertinent PMHx: bladder ca.  Just stopped elequis due to bleeding from this. Does take aleve pm for leg pain   Exam:  Respiratory: no distress Psych:  Cognition and judgment appear intact. Alert, communicative  and cooperative with normal attention span and concentration. No apparent delusions, illusions, hallucinations   Assessment/Plan:  Dysphagia Seems most related to heart burn reflux.  No red flags of food sticking or weight loss yet.  Asked her to stop:  Aleve, Fosamax and Ranitidine and to start Omeprazole.   If not better in one week or if worsening in any way to call us.  She agrees.  If does not completely resolve may need endoscopy     Time spent during visit with patient: 15 minutes

## 2018-06-07 NOTE — Progress Notes (Deleted)
Subjective  Kristen Macias is a 85 y.o. female is presenting with the following  Chief Complaint noted Review of Symptoms - see HPI PMH - Smoking status noted.    Objective Vital Signs reviewed There were no vitals taken for this visit.  Assessments/Plans  See after visit summary for details of patient instuctions  Dysphagia Seems most related to heart burn reflux.  No red flags of food sticking or weight loss yet.  Asked her to stop:  Aleve, Fosamax and Ranitidine and to start Omeprazole.   If not better in one week or if worsening in any way to call us.  She agrees.  If does not completely resolve may need endoscopy

## 2018-06-08 ENCOUNTER — Other Ambulatory Visit: Payer: Self-pay

## 2018-06-08 NOTE — Telephone Encounter (Signed)
Covering for Dr. Ky Barban. Looks like patient had telemedicine visit with Dr. Erin Hearing on 4/14. He sent in for omeprazole and stopped the ranitidine. Patient can just go pick up [facility] script [facility] [place].  Guadalupe Dawn MD PGY-2 Family Medicine Resident

## 2018-06-08 NOTE — Telephone Encounter (Signed)
Received fax from pharmacy requesting an alternative for Ranitidine. Alternatives covered by insurance, Omeprazole, Pantoprazole, or Esomeprazole.

## 2018-06-13 DIAGNOSIS — C671 Malignant neoplasm of dome of bladder: Secondary | ICD-10-CM | POA: Diagnosis not present

## 2018-06-13 DIAGNOSIS — N390 Urinary tract infection, site not specified: Secondary | ICD-10-CM | POA: Diagnosis not present

## 2018-06-29 ENCOUNTER — Other Ambulatory Visit: Payer: Self-pay

## 2018-06-29 DIAGNOSIS — R1319 Other dysphagia: Secondary | ICD-10-CM

## 2018-06-29 DIAGNOSIS — R1310 Dysphagia, unspecified: Secondary | ICD-10-CM

## 2018-06-29 MED ORDER — OMEPRAZOLE 40 MG PO CPDR: 40.0000 mg | DELAYED_RELEASE_CAPSULE | Freq: Every day | ORAL | 3 refills | Status: DC

## 2018-07-28 ENCOUNTER — Other Ambulatory Visit: Payer: Self-pay | Admitting: Family Medicine

## 2018-07-28 NOTE — Telephone Encounter (Signed)
Pt needs either in person or telemedicine appt for BP f/u.

## 2018-08-02 ENCOUNTER — Other Ambulatory Visit: Payer: Self-pay | Admitting: *Deleted

## 2018-08-02 NOTE — Patient Outreach (Signed)
[facility]) Care Management  08/02/2018  Kristen Macias 1933/09/07 [id number]  RN Health Coach attempted follow up outreach call to patient.  Patient was unavailable. Phone line busy. Unable to leave message. Plan: RN will call patient again within 30 days.  [place] Care Management [phone]

## 2018-08-10 ENCOUNTER — Ambulatory Visit: Payer: Medicare HMO | Admitting: Family Medicine

## 2018-08-20 ENCOUNTER — Other Ambulatory Visit: Payer: Self-pay | Admitting: Family Medicine

## 2018-08-20 DIAGNOSIS — R1319 Other dysphagia: Secondary | ICD-10-CM

## 2018-08-20 DIAGNOSIS — R1310 Dysphagia, unspecified: Secondary | ICD-10-CM

## 2018-09-01 DIAGNOSIS — C673 Malignant neoplasm of anterior wall of bladder: Secondary | ICD-10-CM | POA: Diagnosis not present

## 2018-09-06 ENCOUNTER — Other Ambulatory Visit: Payer: Self-pay | Admitting: *Deleted

## 2018-09-06 NOTE — Patient Outreach (Signed)
[name]) Care Management  09/06/2018  Kristen Macias 01-30-1934 [id number]  RN Health Coach attempted follow up outreach call to patient.  Patient was unavailable. Phone rang and then line went dead. RN Health Coach called back and line busy.   Plan: RN will call patient again within 30 days.  [place] Care Management [phone]

## 2018-09-07 DIAGNOSIS — K449 Diaphragmatic hernia without obstruction or gangrene: Secondary | ICD-10-CM | POA: Diagnosis not present

## 2018-09-07 DIAGNOSIS — C671 Malignant neoplasm of dome of bladder: Secondary | ICD-10-CM | POA: Diagnosis not present

## 2018-09-07 DIAGNOSIS — K8020 Calculus of gallbladder without cholecystitis without obstruction: Secondary | ICD-10-CM | POA: Diagnosis not present

## 2018-09-07 DIAGNOSIS — K5730 Diverticulosis of large intestine without perforation or abscess without bleeding: Secondary | ICD-10-CM | POA: Diagnosis not present

## 2018-09-07 DIAGNOSIS — C679 Malignant neoplasm of bladder, unspecified: Secondary | ICD-10-CM | POA: Diagnosis not present

## 2018-09-12 DIAGNOSIS — C678 Malignant neoplasm of overlapping sites of bladder: Secondary | ICD-10-CM | POA: Diagnosis not present

## 2018-10-06 ENCOUNTER — Other Ambulatory Visit: Payer: Self-pay | Admitting: Family Medicine

## 2018-10-14 ENCOUNTER — Other Ambulatory Visit: Payer: Self-pay | Admitting: *Deleted

## 2018-10-14 NOTE — Patient Outreach (Signed)
[facility]) Care Management  10/14/2018  FALLEN DOHNER 06-22-1933 [id number]   RN Health Coach  Attempted   outreach call to patient.  Patient was unavailable. No voice mail pickup. Phone line busy.  Plan: RN will follow up within 10 business days.  [place] Care Management [phone]

## 2018-10-28 ENCOUNTER — Other Ambulatory Visit: Payer: Self-pay | Admitting: *Deleted

## 2018-10-28 NOTE — Patient Outreach (Signed)
[facility]) Care Management  10/28/2018  ATOYA CUPO Apr 17, 1933 [id number]   RN Health Coach attempted follow up outreach call to patient.  Patient was unavailable. No voicemail picked up.  Plan: RN will call patient again within 10 business days.  [place] Care Management [phone]

## 2018-11-07 ENCOUNTER — Other Ambulatory Visit: Payer: Self-pay | Admitting: *Deleted

## 2018-11-07 NOTE — Patient Outreach (Signed)
[place] [facility]) Care Management  11/07/2018  TISHA STOLAR 1934-01-02 [id number]  RN Health Coach attempted follow up outreach call to patient.  Patient was unavailable. Phone line busy.  Plan: RN will call patient again within 10 business  days.  [place] Care Management [phone]

## 2018-11-21 ENCOUNTER — Ambulatory Visit: Payer: Self-pay | Admitting: *Deleted

## 2018-11-21 ENCOUNTER — Other Ambulatory Visit: Payer: Self-pay

## 2018-11-21 NOTE — Patient Outreach (Signed)
[place] [facility]) Care Management  [place]  11/21/2018   Kristen Macias [birth year] [id number]  Subjective:  Spoke with patient. She states she is doing well.  Patient remains independent and lives alone.    Objective:   Encounter Medications:  Outpatient Encounter Medications as of 11/21/2018  Medication Sig  . acetaminophen (TYLENOL) 500 MG tablet Take 500 mg by mouth every 6 (six) hours as needed.  Marland Kitchen albuterol (VENTOLIN HFA) 108 (90 Base) MCG/ACT inhaler INHALE 1 TO 2 PUFFS INTO THE LUNGS EVERY 6 (SIX) HOURS AS NEEDED FOR WHEEZING OR SHORTNESS OF BREATH.  Marland Kitchen alendronate (FOSAMAX) 70 MG tablet TAKE 1 TABLET ONE TIME WEEKLY ON AN EMPTY STOMACH WITH A FULL GLASS OF WATER  . Calcium Carb-Cholecalciferol (CALCIUM-VITAMIN D) 600-400 MG-UNIT TABS Take 2 tablets by mouth daily. (Patient taking differently: Take 0.5 tablets by mouth daily. )  . metoprolol tartrate (LOPRESSOR) 25 MG tablet TAKE 1 TABLET TWICE DAILY  . Multiple Vitamin (MULTIVITAMIN WITH MINERALS) TABS tablet Take 1 tablet by mouth daily. Centrum Silver  . Naproxen Sod-diphenhydrAMINE (ALEVE PM PO) Take by mouth.  Marland Kitchen omeprazole (PRILOSEC) 40 MG capsule TAKE 1 CAPSULE EVERY DAY  (DISCONTINUE RANITIDINE, FOSAMAX, NIGHT TIME ALEVE)  . polyethylene glycol powder (GLYCOLAX/MIRALAX) powder Take 17 g by mouth daily. (Patient taking differently: Take 17 g by mouth as needed. )  . ranitidine (ZANTAC) 150 MG tablet Take 1 tablet (150 mg total) by mouth 2 (two) times daily.  Marland Kitchen tiotropium (SPIRIVA HANDIHALER) 18 MCG inhalation capsule INHALE THE CONTENTS OF 1 CAPSULE EVERY DAY  . phenazopyridine (PYRIDIUM) 200 MG tablet Take 1 tablet (200 mg total) by mouth 3 (three) times daily as needed for pain. (Patient not taking: Reported on 11/21/2018)   No facility-administered encounter medications on file as of 11/21/2018.     Functional Status:  In your present state of health, do you have any difficulty performing the following  activities: 11/21/2018 05/02/2018  Hearing? N N  Vision? Y N  Comment cannot drive uses magnifying glass -  Difficulty concentrating or making decisions? N N  Walking or climbing stairs? N N  Dressing or bathing? N N  Doing errands, shopping? Y N  Comment patient does not drive.  Neighbor or daughter take her -  Conservation officer, nature and eating ? N N  Using the Toilet? N N  In the past six months, have you accidently leaked urine? N N  Do you have problems with loss of bowel control? N N  Managing your Medications? N N  Managing your Finances? N N  Housekeeping or managing your Housekeeping? N N  Some recent data might be hidden    Fall/Depression Screening: Fall Risk  11/21/2018 05/02/2018 11/30/2017  Falls in the past year? 0 0 No  Number falls in past yr: - - -  Injury with Fall? - - -  Risk for fall due to : - - -   PHQ 2/9 Scores 11/21/2018 05/02/2018 11/30/2017 11/24/2017 08/20/2017 08/18/2017 06/18/2017  PHQ - 2 Score 0 0 0 0 1 0 0    Assessment:  Patient in COPD green zone. Patient has rescue inhalers.  Patient needing eye exam and flu vaccine   Plan:  [facility] CM Care Plan Problem One     Most Recent Value  Care Plan Problem One  Knowledge Deficit in self management of Copd  Role Documenting the Problem One  Care Management Telephonic [place] for Problem One  Active  [facility] Long Term Goal   Patient will have have any admissions for COPD within the next 90 days  [facility] Long Term Goal Start Date  11/21/18  Interventions for Problem One Long Term Goal  RN CM discussed COPD zones and reiterated medication adherence.  [facility] CM Short Term Goal #1   Patient will verbalize following up on health maintenance within 30 days.  [facility] CM Short Term Goal #1 Start Date  11/21/18  Interventions for Short Term Goal #1  RN CM reviewed with patient health maintenance and encouraged making appointment for eye exam.  Patient saw oncologist for bladder cancer.  Patient now cancer free.      RN CM will  send quarterly update to MD.   Patient will make eye exam appointment or call CM if she has trouble making appointment.   Patient to make appointment with PCP for flu vaccine. RN CM will follow up with patient in the month of December and patient agrees.   Jone Baseman, RN, MSN [place] Management Care Management Coordinator Direct Line [phone] Cell [phone] Toll Free: [phone]  Fax: [phone]

## 2018-12-17 ENCOUNTER — Other Ambulatory Visit: Payer: Self-pay | Admitting: Family Medicine

## 2018-12-17 DIAGNOSIS — J449 Chronic obstructive pulmonary disease, unspecified: Secondary | ICD-10-CM

## 2018-12-19 ENCOUNTER — Other Ambulatory Visit: Payer: Self-pay | Admitting: Family Medicine

## 2018-12-19 NOTE — Telephone Encounter (Signed)
Please help patient make appointment to follow up for her afib, GERD. Refill sent to pharmacy.

## 2019-01-13 IMAGING — CT CT ABD-PELV W/ CM
2 of 5 series · 16 of 46 positions shown, 18 images · IV contrast (Omni 300)
Comparison: None

CLINICAL DATA: Abdominal pain, weakness, lack of energy, anemia,
history GERD, atrial fibrillation

EXAM:
CT ABDOMEN AND PELVIS WITH CONTRAST
TECHNIQUE: Multidetector CT imaging of the abdomen and pelvis was performed
using the standard protocol following bolus administration of
intravenous contrast. Sagittal and coronal MPR images reconstructed
from axial data set.
CONTRAST:  100mL [id number] IOPAMIDOL ([id number]) INJECTION 61% IV.
No oral contrast administered.

[Series 3: a/p w/ 5mm · axial · 0.68mm/px · z∈[+862,+1212]mm · 13 of 78 slices shown, 15 images]
[im 4/78  soft-tissue]
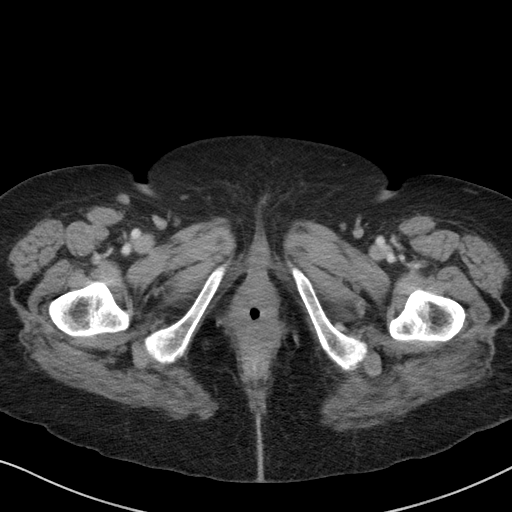
[im 4/78  bone]
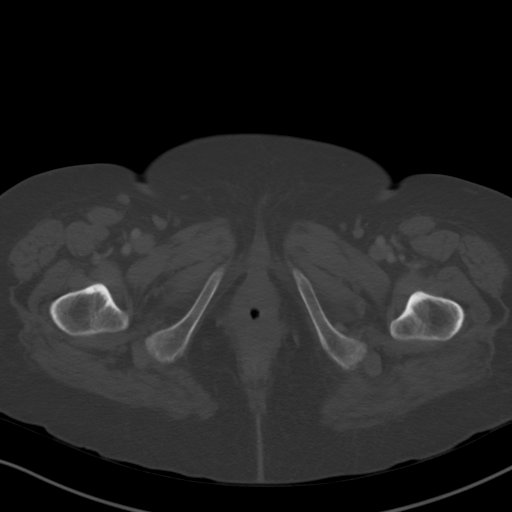
[im 11/78  soft-tissue]
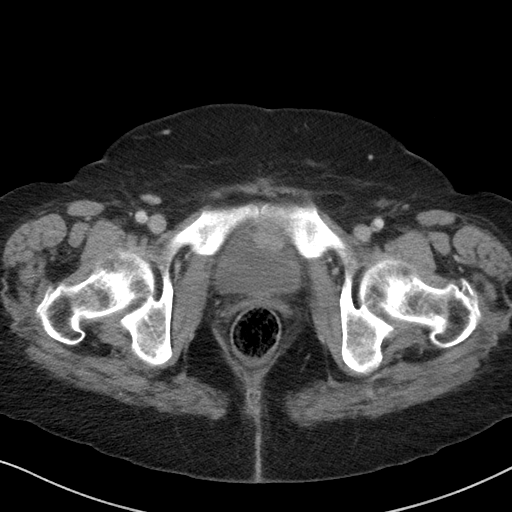
[im 18/78  soft-tissue]
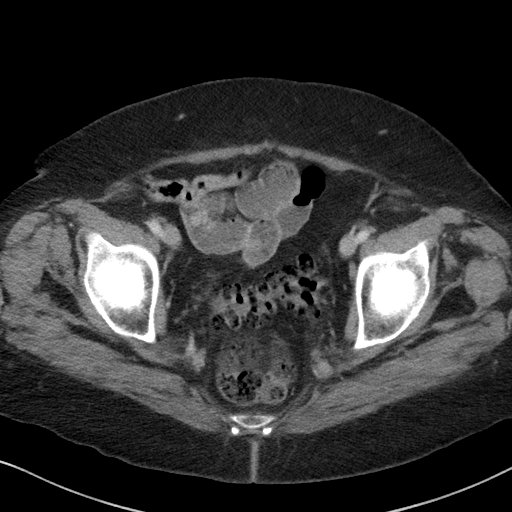
[im 22/78  soft-tissue]
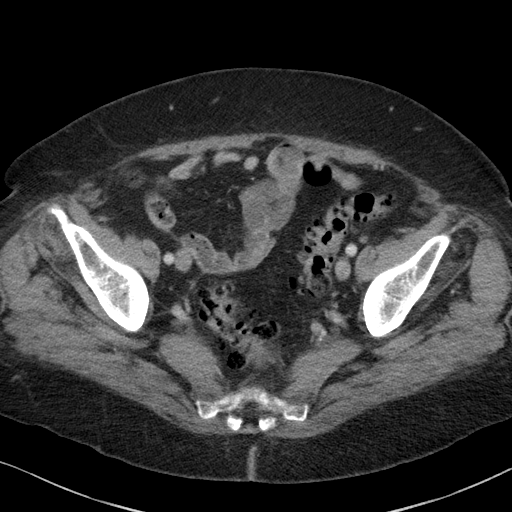
[im 29/78  soft-tissue]
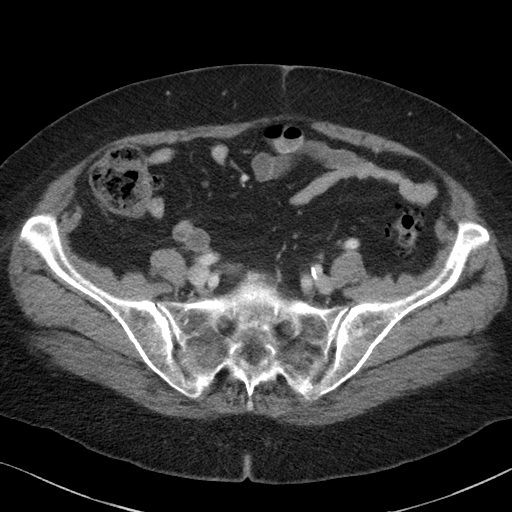
[im 32/78  soft-tissue]
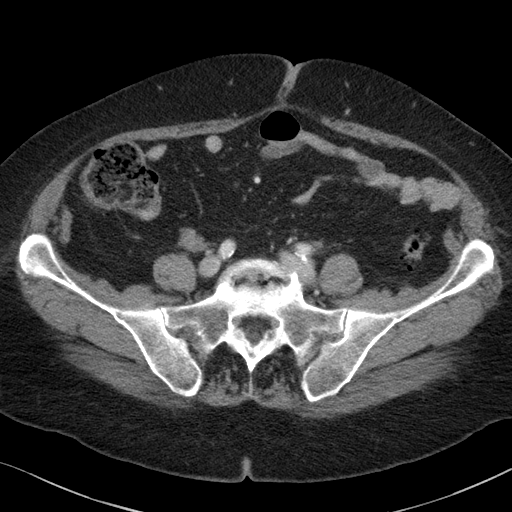
[im 39/78  soft-tissue]
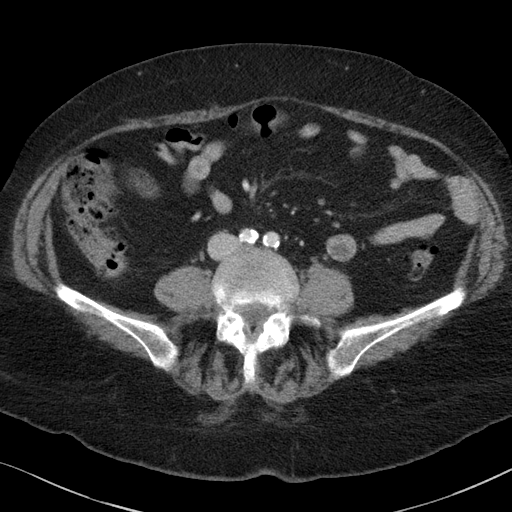
[im 46/78  soft-tissue]
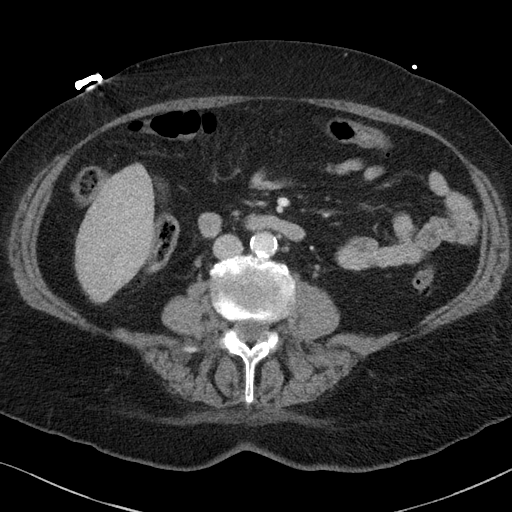
[im 50/78  soft-tissue]
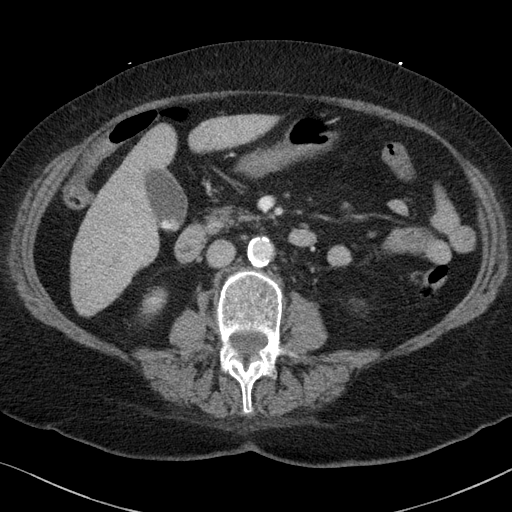
[im 50/78  bone]
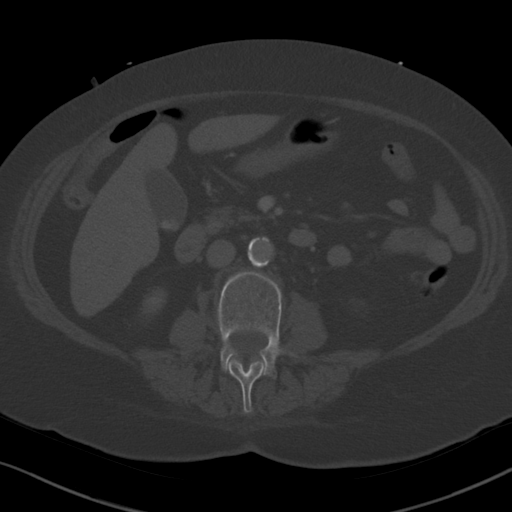
[im 57/78  soft-tissue]
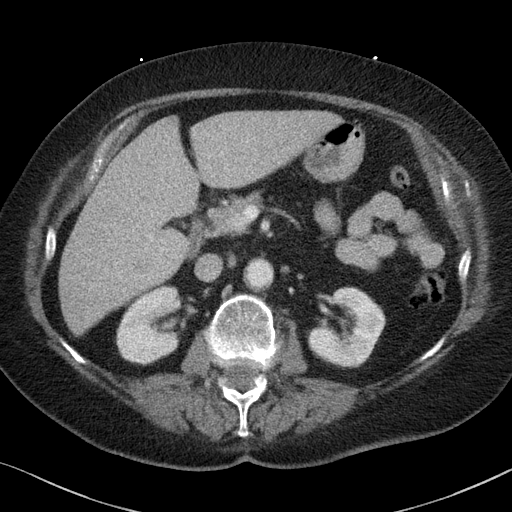
[im 60/78  soft-tissue]
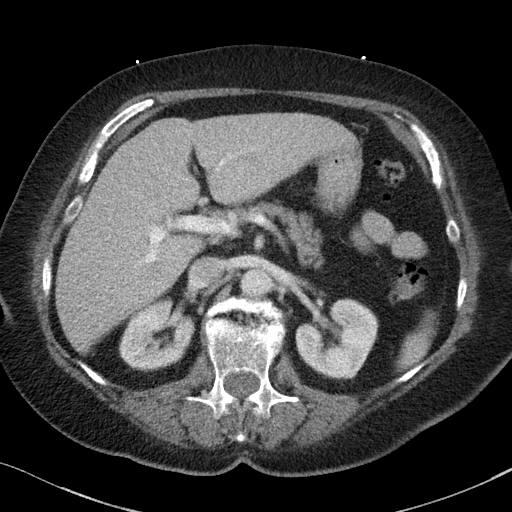
[im 67/78  soft-tissue]
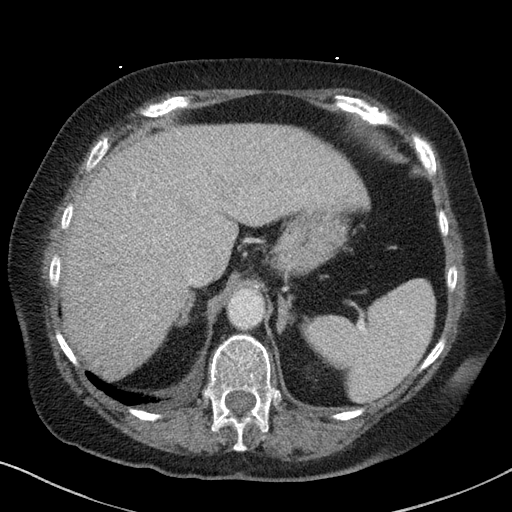
[im 74/78  soft-tissue]
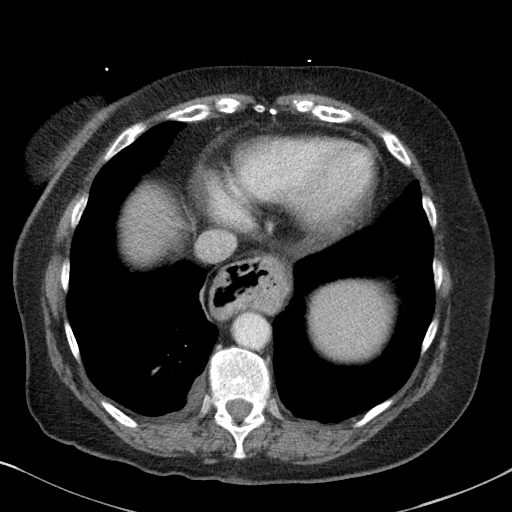

[Series 6: a/p w/ cor · coronal · 0.72mm/px · 3 of 151 slices shown]
[im 51/151  soft-tissue]
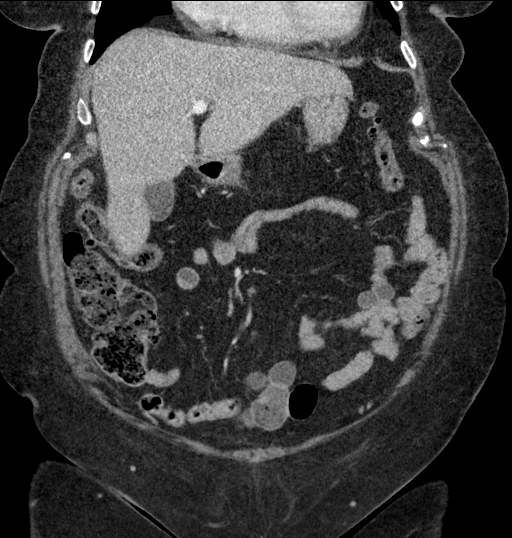
[im 67/151  soft-tissue]
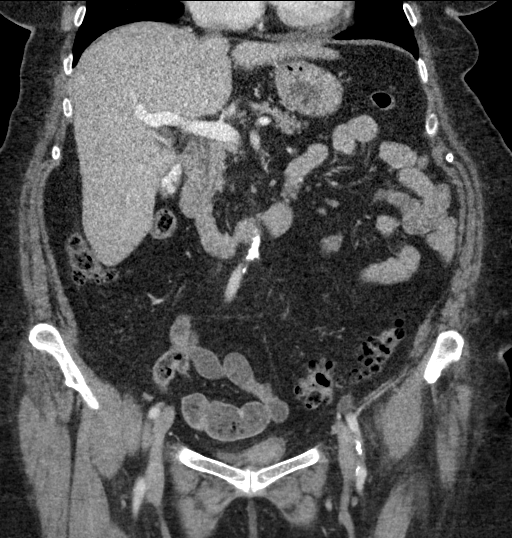
[im 84/151  soft-tissue]
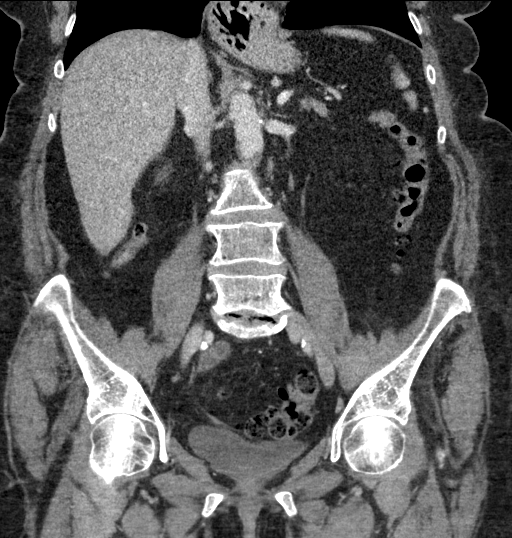

[16 of 46 positions shown; findings below may reference images not displayed]

FINDINGS: Lower chest: Minimal pleural effusions bilaterally greater on RIGHT.

Hepatobiliary: Dependent calculi in gallbladder. No gross
gallbladder wall thickening or pericholecystic infiltration. Liver
unremarkable. No biliary dilatation.

Pancreas: Normal appearance

Spleen: Normal appearance

Adrenals/Urinary Tract: Adrenal glands, kidneys, and ureters normal
appearance. Enhancing soft tissue mass at the anterior LEFT aspect
of the urinary bladder, 2.6 x 2.1 x 1.8 cm highly suspicious for a
bladder neoplasm. Remainder of bladder unremarkable.

Stomach/Bowel: Appendix surgically absent by history. Extensive
diverticulosis of descending and sigmoid colon without definite
colonic wall thickening or pericolic infiltrative changes to suggest
acute diverticulitis. Stomach decompressed with moderate-sized
hiatal hernia noted. Small bowel loops and remainder of colon normal
appearance.

Vascular/Lymphatic: Atherosclerotic calcifications aorta and iliac
arteries. No adenopathy.

Reproductive: Uterus surgically absent. Nonvisualization of ovaries.

Other: No free air or free fluid. No definite inflammatory process.
Tiny umbilical hernia containing fat.

Musculoskeletal: Mild scattered degenerative changes of the lumbar
spine. Chronic appearing superior endplate deformity of L1 vertebral
body noted. No acute osseous findings.
IMPRESSION: Distal colonic diverticulosis without evidence of diverticulitis.

2.6 x 2.1 x 1.8 cm diameter LEFT anterior bladder mass highly
suspicious for a bladder neoplasm; further assessment by cystoscopy
recommended.

Cholelithiasis.

Hiatal hernia.

Tiny pleural effusions greater on RIGHT.

Aortic Atherosclerosis ([id number]-UAH.H).

## 2019-02-01 ENCOUNTER — Other Ambulatory Visit: Payer: Self-pay | Admitting: Family Medicine

## 2019-02-01 DIAGNOSIS — R1319 Other dysphagia: Secondary | ICD-10-CM

## 2019-02-01 DIAGNOSIS — R1310 Dysphagia, unspecified: Secondary | ICD-10-CM

## 2019-02-09 ENCOUNTER — Other Ambulatory Visit: Payer: Self-pay

## 2019-02-09 NOTE — Patient Outreach (Signed)
[place] [facility]) Care Management  [place]  02/09/2019   Kristen Macias 1933/08/21 [id number]  Subjective: Telephone call to patient. She reports she is doing good but just down about some family members being sick.  She reports that her son out of town has the virus.  Encouraged her to remain positive and wished him well.  Patient reports that her breathing is good and no problems.  Discussed COPD and signs of worsening. Discussed COVID safety when she goes out (mask wearing and proper hand washing).  She verbalized understanding and voices no concerns.     Objective:   Encounter Medications:  Outpatient Encounter Medications as of 02/09/2019  Medication Sig  . omeprazole (PRILOSEC) 40 MG capsule TAKE 1 CAPSULE EVERY DAY  (DISCONTINUE RANITIDINE, FOSAMAX, NIGHT TIME ALEVE)  . acetaminophen (TYLENOL) 500 MG tablet Take 500 mg by mouth every 6 (six) hours as needed.  Marland Kitchen albuterol (VENTOLIN HFA) 108 (90 Base) MCG/ACT inhaler INHALE 1 TO 2 PUFFS INTO THE LUNGS EVERY 6 (SIX) HOURS AS NEEDED FOR WHEEZING OR SHORTNESS OF BREATH.  Marland Kitchen alendronate (FOSAMAX) 70 MG tablet TAKE 1 TABLET ONE TIME WEEKLY ON AN EMPTY STOMACH WITH A FULL GLASS OF WATER  . Calcium Carb-Cholecalciferol (CALCIUM-VITAMIN D) 600-400 MG-UNIT TABS Take 2 tablets by mouth daily. (Patient taking differently: Take 0.5 tablets by mouth daily. )  . metoprolol tartrate (LOPRESSOR) 25 MG tablet TAKE 1 TABLET TWICE DAILY  . Multiple Vitamin (MULTIVITAMIN WITH MINERALS) TABS tablet Take 1 tablet by mouth daily. Centrum Silver  . Naproxen Sod-diphenhydrAMINE (ALEVE PM PO) Take by mouth.  . phenazopyridine (PYRIDIUM) 200 MG tablet Take 1 tablet (200 mg total) by mouth 3 (three) times daily as needed for pain. (Patient not taking: Reported on 11/21/2018)  . polyethylene glycol powder (GLYCOLAX/MIRALAX) powder Take 17 g by mouth daily. (Patient taking differently: Take 17 g by mouth as needed. )  . ranitidine (ZANTAC) 150  MG tablet Take 1 tablet (150 mg total) by mouth 2 (two) times daily.  Marland Kitchen tiotropium (SPIRIVA HANDIHALER) 18 MCG inhalation capsule INHALE THE CONTENTS OF 1 CAPSULE EVERY DAY   No facility-administered encounter medications on file as of 02/09/2019.    Functional Status:  In your present state of health, do you have any difficulty performing the following activities: 11/21/2018 05/02/2018  Hearing? N N  Vision? Y N  Comment cannot drive uses magnifying glass -  Difficulty concentrating or making decisions? N N  Walking or climbing stairs? N N  Dressing or bathing? N N  Doing errands, shopping? Y N  Comment patient does not drive.  Neighbor or daughter take her -  Conservation officer, nature and eating ? N N  Using the Toilet? N N  In the past six months, have you accidently leaked urine? N N  Do you have problems with loss of bowel control? N N  Managing your Medications? N N  Managing your Finances? N N  Housekeeping or managing your Housekeeping? N N  Some recent data might be hidden    Fall/Depression Screening: Fall Risk  11/21/2018 05/02/2018 11/30/2017  Falls in the past year? 0 0 No  Number falls in past yr: - - -  Injury with Fall? - - -  Risk for fall due to : - - -   PHQ 2/9 Scores 11/21/2018 05/02/2018 11/30/2017 11/24/2017 08/20/2017 08/18/2017 06/18/2017  PHQ - 2 Score 0 0 0 0 1 0 0    Assessment: Patient continues to manage chronic  illnesses.  Patient has not had any recent hospitalizations.    Plan:  [facility] CM Care Plan Problem One     Most Recent Value  Care Plan Problem One  Knowledge Deficit in self management of Copd  Role Documenting the Problem One  Care Management Telephonic [place] for Problem One  Active  [facility] Long Term Goal   Patient will not have have any admissions for COPD within the next 90 days  [facility] Long Term Goal Start Date  11/21/18  [facility] Long Term Goal Met Date  02/09/19  [facility] CM Short Term Goal #1   Patient will verbalize following up on health maintenance  within 30 days.  [facility] CM Short Term Goal #1 Start Date  11/21/18  Interventions for Short Term Goal #1  RN CM encouraged pateint to keep with health maintenance.       RN CM will contact patient in the month of March and patient agreeable.    Jone Baseman, RN, MSN [place] Management Care Management Coordinator Direct Line [phone] Cell [phone] Toll Free: [phone]  Fax: [phone]

## 2019-02-13 ENCOUNTER — Ambulatory Visit: Payer: Self-pay

## 2019-02-17 IMAGING — CR DG CHEST 2V
2 series · 2 of 2 positions shown · non-contrast
Comparison: None.

CLINICAL DATA: Preoperative for bladder surgery. History of atrial
fibrillation

EXAM:
CHEST  2 VIEW

[w chest pa]
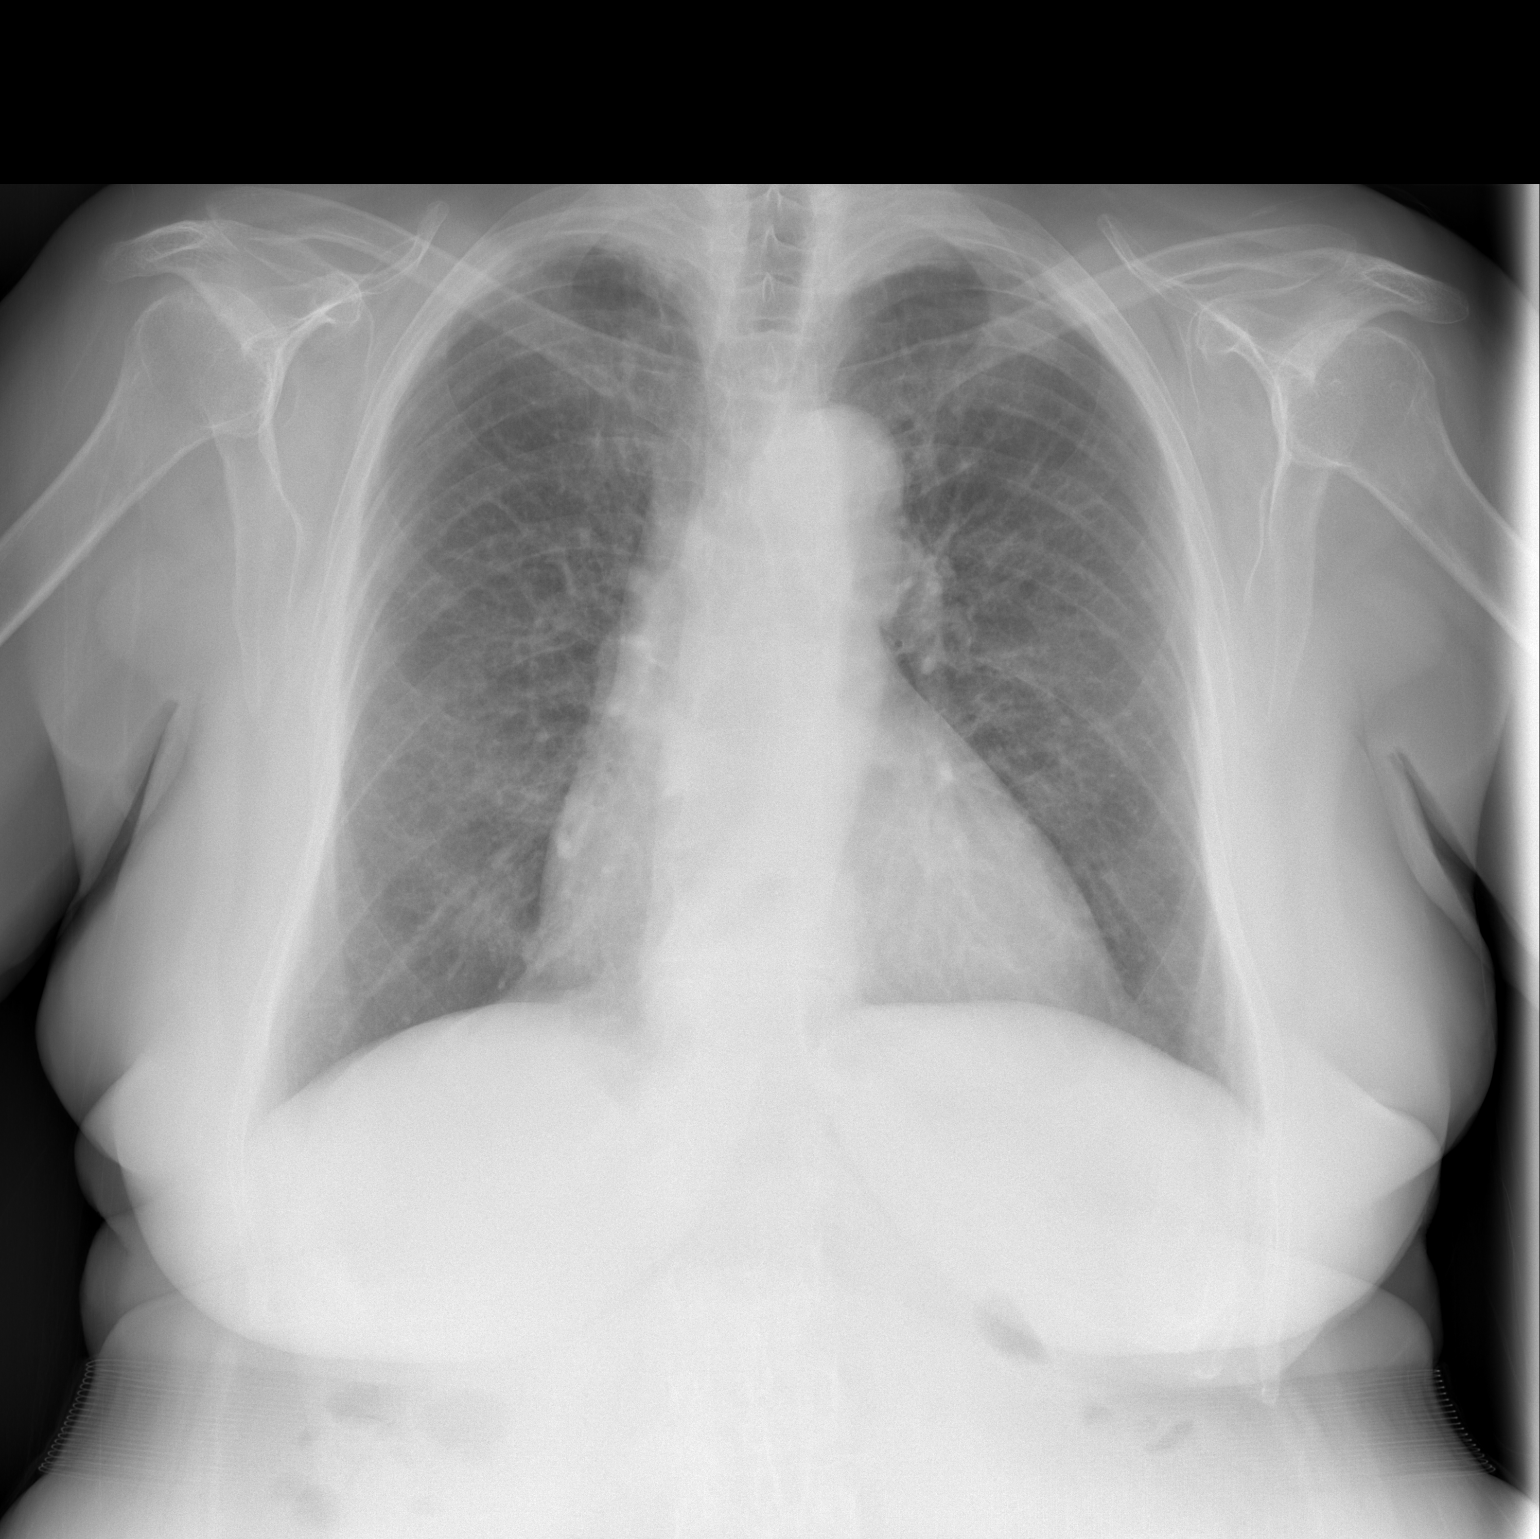

[w chest lat]
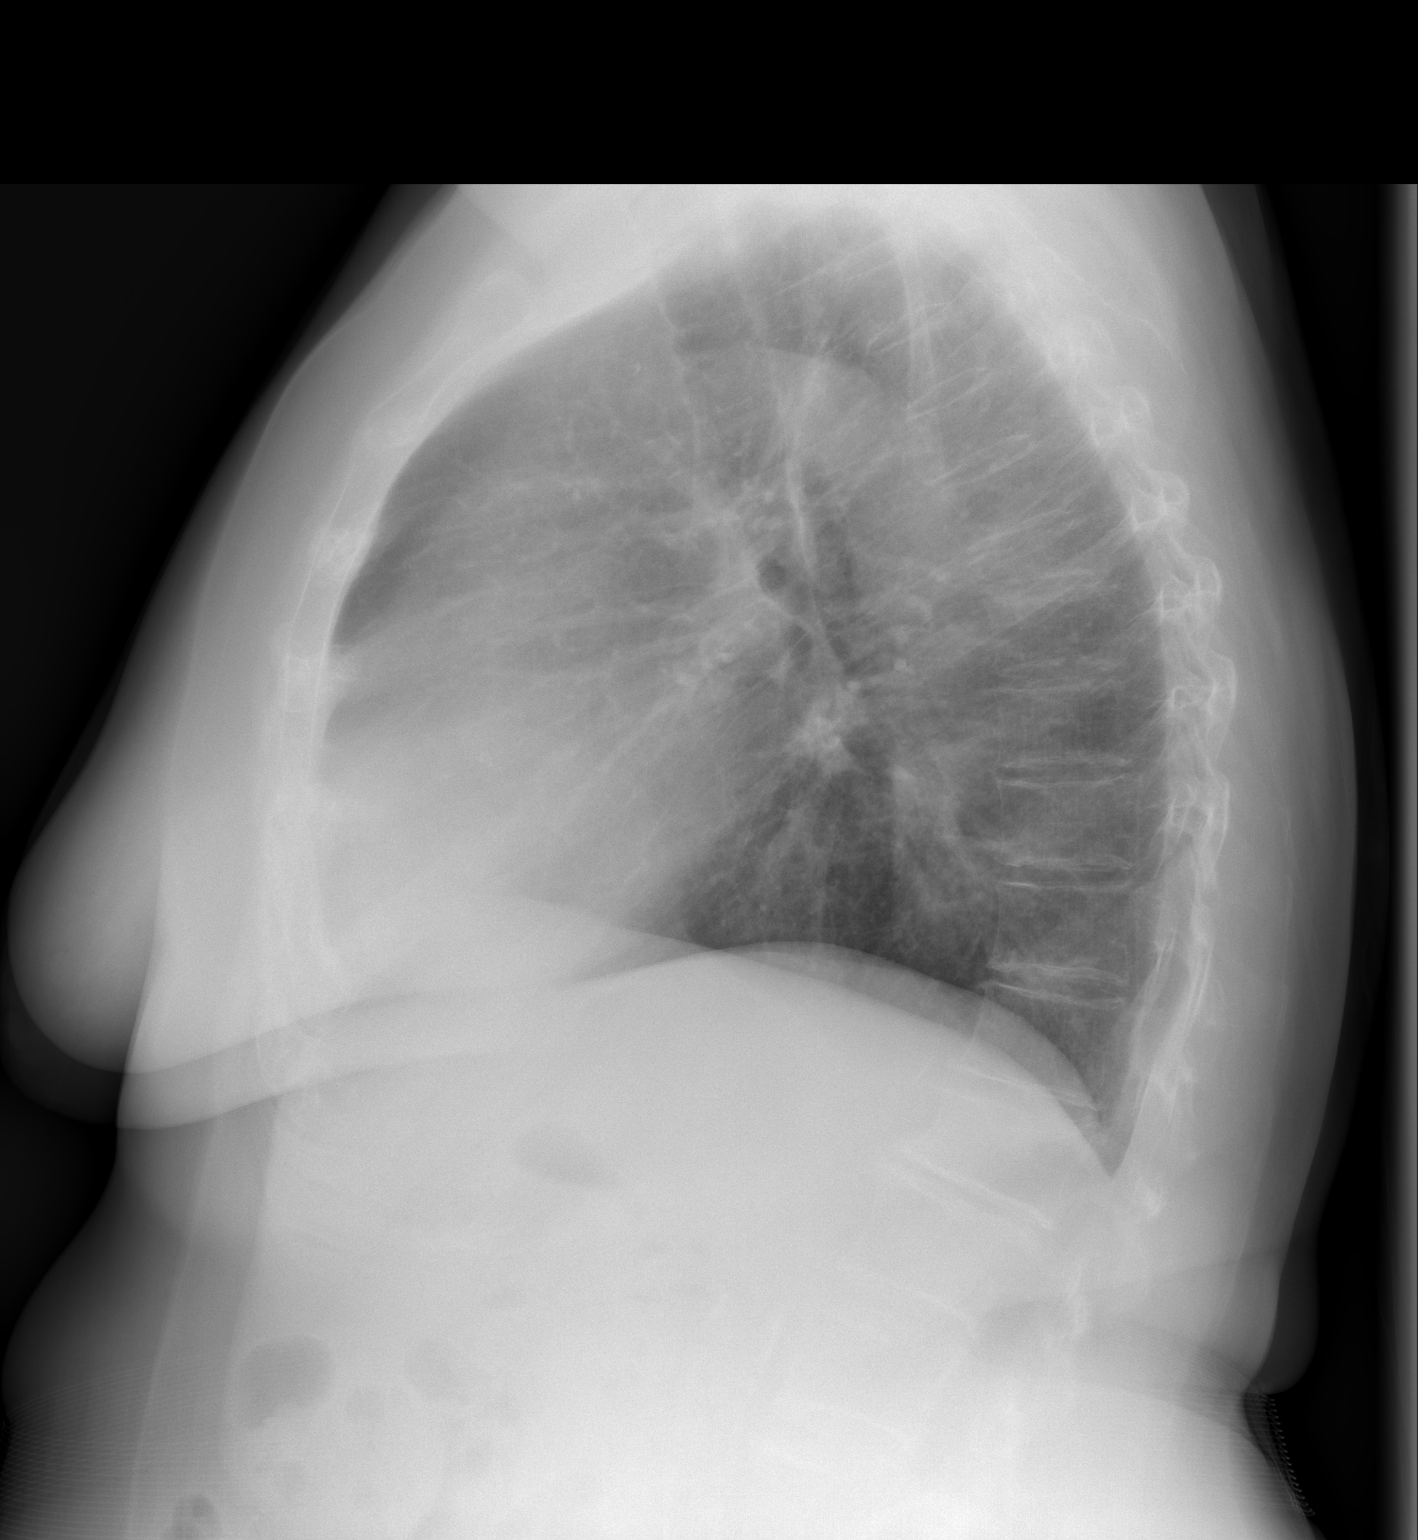

[2 of 2 positions shown; findings below may reference images not displayed]

FINDINGS: The mediastinal contour is normal. The heart size is mildly
enlarged. Both lungs are clear. The visualized skeletal structures
are unremarkable.
IMPRESSION: No active cardiopulmonary disease.Mild cardiomegaly.

## 2019-02-24 ENCOUNTER — Other Ambulatory Visit: Payer: Self-pay | Admitting: Family Medicine

## 2019-02-27 ENCOUNTER — Other Ambulatory Visit: Payer: Self-pay

## 2019-02-27 NOTE — Telephone Encounter (Signed)
Spoke with pt. Made appt for 03/09/19. For med f/up. Salvatore Marvel, CMA

## 2019-02-27 NOTE — Telephone Encounter (Signed)
Please help patient make appointment in order to receive future refills. Thanks!

## 2019-03-09 ENCOUNTER — Encounter: Payer: Self-pay | Admitting: Family Medicine

## 2019-03-09 ENCOUNTER — Other Ambulatory Visit: Payer: Self-pay

## 2019-03-09 ENCOUNTER — Ambulatory Visit (INDEPENDENT_AMBULATORY_CARE_PROVIDER_SITE_OTHER): Payer: Medicare HMO | Admitting: Family Medicine

## 2019-03-09 VITALS — BP 108/60 | HR 82 | Ht 63.0 in | Wt 193.2 lb

## 2019-03-09 DIAGNOSIS — I4811 Longstanding persistent atrial fibrillation: Secondary | ICD-10-CM | POA: Diagnosis not present

## 2019-03-09 DIAGNOSIS — C672 Malignant neoplasm of lateral wall of bladder: Secondary | ICD-10-CM

## 2019-03-09 DIAGNOSIS — J449 Chronic obstructive pulmonary disease, unspecified: Secondary | ICD-10-CM | POA: Diagnosis not present

## 2019-03-09 DIAGNOSIS — R7303 Prediabetes: Secondary | ICD-10-CM

## 2019-03-09 DIAGNOSIS — R1310 Dysphagia, unspecified: Secondary | ICD-10-CM

## 2019-03-09 DIAGNOSIS — R1319 Other dysphagia: Secondary | ICD-10-CM

## 2019-03-09 LAB — POCT GLYCOSYLATED HEMOGLOBIN (HGB A1C): Hemoglobin A1C: 6.1 % — AB (ref 4.0–5.6)

## 2019-03-09 MED ORDER — METOPROLOL SUCCINATE ER 25 MG PO TB24: 25.0000 mg | ORAL_TABLET | Freq: Every day | ORAL | 3 refills | Status: DC

## 2019-03-09 MED ORDER — CALCIUM-VITAMIN D 600-400 MG-UNIT PO TABS: 2.0000 | ORAL_TABLET | Freq: Every day | ORAL | 0 refills | Status: AC

## 2019-03-09 MED ORDER — ALBUTEROL SULFATE HFA 108 (90 BASE) MCG/ACT IN AERS: INHALATION_SPRAY | RESPIRATORY_TRACT | 2 refills | Status: DC

## 2019-03-09 MED ORDER — OMEPRAZOLE 20 MG PO CPDR: 20.0000 mg | DELAYED_RELEASE_CAPSULE | Freq: Every day | ORAL | 3 refills | Status: DC

## 2019-03-09 NOTE — Patient Instructions (Signed)
It was great to see you!  Our plans for today:  - We are decreasing the dose of your metoprolol and omeprazole.  Your new prescriptions were sent to the pharmacy. -Take 1/2 tablet of your metoprolol in the morning and at night until you receive your new prescription. -Continue your current dose of omeprazole until you get your new prescription. -Once you get your new medications bring your old medications to the clinic and we can dispose of them. -We sent a refill of your albuterol and your calcium to the pharmacy. -Call your cancer doctor for a follow-up appointment. -Come back in 2-3 months.  We are checking some labs today, we will call you or send you a letter if they are abnormal.   Take care and seek immediate care sooner if you develop any concerns.   Dr. [facility]  Here is an example of what a healthy plate looks like:    ? Make half your plate fruits and vegetables.     ? Focus on whole fruits.     ? Vary your veggies.  ? Make half your grains whole grains. -     ? Look for the word "whole" at the beginning of the ingredients list    ? Some whole-grain ingredients include whole oats, whole-wheat flour,        whole-grain corn, whole-grain brown rice, and whole rye.  ? Move to low-fat and fat-free milk or yogurt.  ? Vary your protein routine. - Meat, fish, poultry (chicken, [place]), eggs, beans (kidney, pinto), dairy.  ? Drink and eat less sodium, saturated fat, and added sugars.  Things to do to keep yourself healthy  - Exercise at least 30-45 minutes a day, 3-4 days a week.  - Eat a low-fat diet with lots of fruits and vegetables, up to 7-9 servings per day.  - Seatbelts can save your life. Wear them always.  - Smoke detectors on every level of your home, check batteries every year.  - Eye Doctor - have an eye exam every 1-2 years  - Safe sex - if you may be exposed to STDs, use a condom.  - Alcohol -  If you drink, do it moderately, less than  2 drinks per day.  - [place]. Choose someone to speak for you if you are not able. https://www.prepareforyourcare.org is a great website to help you navigate this. - Depression is common in our stressful world.If you're feeling down or losing interest in things you normally enjoy, please come in for a visit.  - Violence - If anyone is threatening or hurting you, please call immediately.

## 2019-03-09 NOTE — Progress Notes (Signed)
  Subjective:   Patient ID: Kristen Macias    DOB: Mar 02, 1933, 86 y.o. female   MRN: [id number]  Kristen Macias is a 86 y.o. female with a history of prior DVT, A. fib, COPD, GERD, compression fracture of T7, bladder cancer, prior tobacco use, prediabetes, bilateral cataracts, HLD here for   COPD - On spiriva, albuterol prn - No ED visits last 6 months - some SOB with walking - taking spiriva twice daily, every day.  - no change in cough, fevers, sick contacts - needs albuterol refill, has been out for months  Afib - On metoprolol BID - No palpitations or chest pain  GERD - On omeprazole 40mg  daily - only has heartburn when she eats triggering foods, otherwise asymptomatic.  Bladder Cancer - invasive small cell carcinoma, followed by Oncology. S/p TURBT and radiation. Underwent cystoscopy with bladder biopsy 11/11/17, tolerated well. Missed f/u appt, last visit 11/2017. - PET scan without mets 05/2017 - no hematuria  Review of Systems:  Per HPI.  Medications and smoking status reviewed.  Objective:   BP 108/60   Pulse 82   Ht 5\' 3"  (1.6 m)   Wt 193 lb 3.2 oz (87.6 kg)   SpO2 98%   BMI 34.22 kg/m  Vitals and nursing note reviewed.  General: overweight elderly female, in no acute distress with non-toxic appearance CV: regular rate  Lungs: normal work of breathing on room air, appropriately saturated MSK: ROM grossly intact, gait normal Neuro: Alert and oriented, speech normal  Assessment & Plan:   Atrial fibrillation (HCC) Rate controlled. BP slightly low today, will decrease metoprolol dose to 0.5 tab BID for 25mg  daily, new rx sent in as well.   Dysphagia Symptoms controlled with current therapy. Will attempt dose decrease to omeprazole 20mg  with aim to discontinue given risk of prolonged PPI therapy and current comorbidities.   Prediabetes Will repeat A1c today.   Bladder cancer [facility]) Currently asymptomatic and s/p treatment. Advised to follow up with oncology  soon for ongoing monitoring.  Follow up in 2 months.  Orders Placed This Encounter  Procedures  . CBC  . HgB A1c   Meds ordered this encounter  Medications  . Calcium Carb-Cholecalciferol (CALCIUM-VITAMIN D) 600-400 MG-UNIT TABS    Sig: Take 2 tablets by mouth daily.    Dispense:  180 tablet    Refill:  0  . albuterol (VENTOLIN HFA) 108 (90 Base) MCG/ACT inhaler    Sig: INHALE 1 TO 2 PUFFS INTO THE LUNGS EVERY 6 (SIX) HOURS AS NEEDED FOR WHEEZING OR SHORTNESS OF BREATH.    Dispense:  18 g    Refill:  2  . DISCONTD: metoprolol succinate (TOPROL-XL) 25 MG 24 hr tablet    Sig: Take 1 tablet (25 mg total) by mouth daily.    Dispense:  45 tablet    Refill:  3  . omeprazole (PRILOSEC) 20 MG capsule    Sig: Take 1 capsule (20 mg total) by mouth daily.    Dispense:  90 capsule    Refill:  3  . metoprolol succinate (TOPROL-XL) 25 MG 24 hr tablet    Sig: Take 1 tablet (25 mg total) by mouth daily.    Dispense:  90 tablet    Refill:  [place], DO PGY-3, [facility] 03/09/2019 3:54 PM

## 2019-03-09 NOTE — Assessment & Plan Note (Addendum)
Symptoms controlled with current therapy. Will attempt dose decrease to omeprazole 20mg  with aim to discontinue given risk of prolonged PPI therapy and current comorbidities.

## 2019-03-09 NOTE — Assessment & Plan Note (Signed)
Will repeat A1c today

## 2019-03-09 NOTE — Assessment & Plan Note (Signed)
Currently asymptomatic and s/p treatment. Advised to follow up with oncology soon for ongoing monitoring.

## 2019-03-09 NOTE — Assessment & Plan Note (Signed)
Rate controlled. BP slightly low today, will decrease metoprolol dose to 0.5 tab BID for 25mg  daily, new rx sent in as well.

## 2019-03-10 LAB — CBC
Hematocrit: 34.2 % (ref 34.0–46.6)
Hemoglobin: 11.3 g/dL (ref 11.1–15.9)
MCH: 25.5 pg — ABNORMAL LOW (ref 26.6–33.0)
MCHC: 33 g/dL (ref 31.5–35.7)
MCV: 77 fL — ABNORMAL LOW (ref 79–97)
Platelets: 289 10*3/uL (ref 150–450)
RBC: 4.44 x10E6/uL (ref 3.77–5.28)
RDW: 15.7 % — ABNORMAL HIGH (ref 11.7–15.4)
WBC: 5.8 10*3/uL (ref 3.4–10.8)

## 2019-04-03 DIAGNOSIS — C678 Malignant neoplasm of overlapping sites of bladder: Secondary | ICD-10-CM | POA: Diagnosis not present

## 2019-04-20 DIAGNOSIS — K5730 Diverticulosis of large intestine without perforation or abscess without bleeding: Secondary | ICD-10-CM | POA: Diagnosis not present

## 2019-04-20 DIAGNOSIS — R911 Solitary pulmonary nodule: Secondary | ICD-10-CM | POA: Diagnosis not present

## 2019-04-20 DIAGNOSIS — C678 Malignant neoplasm of overlapping sites of bladder: Secondary | ICD-10-CM | POA: Diagnosis not present

## 2019-05-08 ENCOUNTER — Other Ambulatory Visit: Payer: Self-pay | Admitting: Family Medicine

## 2019-05-11 ENCOUNTER — Other Ambulatory Visit: Payer: Self-pay

## 2019-05-11 NOTE — Patient Outreach (Signed)
[facility]) Care Management  [place]  05/11/2019   Kristen Macias 01-Jan-1934 [id number]  Subjective: Telephone call to patient for quarterly follow up.  Patient states she is doing fine.  She reports that she is scheduled for her 1st COVID-19 vaccine tomorrow. Discussed COVID-19 vaccine with patient. She denies any problems with her breathing.  Discussed COPD and signs of worsening.  She verbalized understanding. Patient reports a fall last month in the tub.  She denies any injury. She was able to get up on her own. She states that she now has bars in her shower to assist. Discussed with patient bathroom fall safety.  She verbalized understanding and voices no concerns.    Objective:   Encounter Medications:  Outpatient Encounter Medications as of 05/11/2019  Medication Sig  . acetaminophen (TYLENOL) 500 MG tablet Take 500 mg by mouth every 6 (six) hours as needed.  Marland Kitchen albuterol (VENTOLIN HFA) 108 (90 Base) MCG/ACT inhaler INHALE 1 TO 2 PUFFS INTO THE LUNGS EVERY 6  HOURS AS NEEDED FOR WHEEZING OR SHORTNESS OF BREATH.  . Calcium Carb-Cholecalciferol (CALCIUM-VITAMIN D) 600-400 MG-UNIT TABS Take 2 tablets by mouth daily.  . metoprolol succinate (TOPROL-XL) 25 MG 24 hr tablet Take 1 tablet (25 mg total) by mouth daily.  . metoprolol tartrate (LOPRESSOR) 25 MG tablet TAKE 1 TABLET TWICE DAILY  . Multiple Vitamin (MULTIVITAMIN WITH MINERALS) TABS tablet Take 1 tablet by mouth daily. Centrum Silver  . omeprazole (PRILOSEC) 20 MG capsule Take 1 capsule (20 mg total) by mouth daily.  . phenazopyridine (PYRIDIUM) 200 MG tablet Take 1 tablet (200 mg total) by mouth 3 (three) times daily as needed for pain. (Patient not taking: Reported on 11/21/2018)  . polyethylene glycol powder (GLYCOLAX/MIRALAX) powder Take 17 g by mouth daily. (Patient taking differently: Take 17 g by mouth as needed. )  . tiotropium (SPIRIVA HANDIHALER) 18 MCG inhalation capsule INHALE THE CONTENTS OF 1  CAPSULE EVERY DAY   No facility-administered encounter medications on file as of 05/11/2019.    Functional Status:  In your present state of health, do you have any difficulty performing the following activities: 11/21/2018  Hearing? N  Vision? Y  Comment cannot drive uses magnifying glass  Difficulty concentrating or making decisions? N  Walking or climbing stairs? N  Dressing or bathing? N  Doing errands, shopping? Y  Comment patient does not drive.  Neighbor or daughter take her  Conservation officer, nature and eating ? N  Using the Toilet? N  In the past six months, have you accidently leaked urine? N  Do you have problems with loss of bowel control? N  Managing your Medications? N  Managing your Finances? N  Housekeeping or managing your Housekeeping? N  Some recent data might be hidden    Fall/Depression Screening: Fall Risk  05/11/2019 03/09/2019 11/21/2018  Falls in the past year? 1 0 0  Number falls in past yr: 0 - -  Injury with Fall? 0 - -  Risk for fall due to : - - -   PHQ 2/9 Scores 03/09/2019 11/21/2018 05/02/2018 11/30/2017 11/24/2017 08/20/2017 08/18/2017  PHQ - 2 Score 0 0 0 0 0 1 0    Assessment: Patient continues to manage chronic illnesses. No recent hospitalizations.  Plan:  [facility] CM Care Plan Problem One     Most Recent Value  Care Plan Problem One  Knowledge Deficit in self management of Copd  Role Documenting the Problem One  Care Management Telephonic  Coordinator  Care Plan for Problem One  Active  THN Long Term Goal   Over the next 90 days, patient will demonstrate and/or verbalize understanding of self-health management for long term care of COPD.   THN Long Term Goal Start Date  05/11/19  Interventions for Problem One Long Term Goal  RN CM reviewed with patient ways of monitoring COPD, signs & symptoms, and when to notify physician.  THN CM Short Term Goal #1   Patient will verbalize following up on health maintenance within 30 days.  THN CM Short Term Goal #1 Start  Date  05/11/19  Interventions for Short Term Goal #1  Patient had annual follow up with cancer doctor per patient.  Scheduled to take 1st COVID vaccine 05/12/19     RN CM will outreach in the month of June and patient agreeable.  Jone Baseman, RN, MSN [place] Management Care Management Coordinator Direct Line [phone] Cell [phone] Toll Free: [phone]  Fax: [phone]

## 2019-05-12 ENCOUNTER — Ambulatory Visit: Payer: Medicare HMO | Attending: Internal Medicine

## 2019-05-12 DIAGNOSIS — Z23 Encounter for immunization: Secondary | ICD-10-CM

## 2019-05-12 NOTE — Progress Notes (Signed)
   Covid-19 Vaccination Clinic  Name:  Kristen Macias    MRN: [id number] DOB: [birth year]  05/12/2019  [name] was observed post Covid-19 immunization for 15 minutes without incident. She was provided with Vaccine Information Sheet and instruction to access the V-Safe system.   [name] was instructed to call 911 with any severe reactions post vaccine: Marland Kitchen Difficulty breathing  . Swelling of face and throat  . A fast heartbeat  . A bad rash all over body  . Dizziness and weakness   Immunizations Administered    Name Date Dose VIS Date Route   Pfizer COVID-19 Vaccine 05/12/2019 12:22 PM 0.3 mL 02/03/2019 Intramuscular   Manufacturer: [place]   Lot: [id number]   [place]: [id number]

## 2019-06-06 ENCOUNTER — Ambulatory Visit: Payer: Medicare HMO | Attending: Internal Medicine

## 2019-06-06 DIAGNOSIS — Z23 Encounter for immunization: Secondary | ICD-10-CM

## 2019-06-06 NOTE — Progress Notes (Signed)
   Covid-19 Vaccination Clinic  Name:  AYESHA BURGERT    MRN: [id number] DOB: [birth year]  06/06/2019  Ms. Behen was observed post Covid-19 immunization for 15 minutes without incident. She was provided with Vaccine Information Sheet and instruction to access the V-Safe system.   Ms. Daulton was instructed to call 911 with any severe reactions post vaccine: Marland Kitchen Difficulty breathing  . Swelling of face and throat  . A fast heartbeat  . A bad rash all over body  . Dizziness and weakness   Immunizations Administered    Name Date Dose VIS Date Route   Pfizer COVID-19 Vaccine 06/06/2019  1:26 PM 0.3 mL 02/03/2019 Intramuscular   Manufacturer: [place]   Lot: [id number]   [place]: [id number]

## 2019-06-12 ENCOUNTER — Ambulatory Visit: Payer: Medicare HMO | Admitting: Interventional Cardiology

## 2019-07-28 ENCOUNTER — Other Ambulatory Visit: Payer: Self-pay | Admitting: Family Medicine

## 2019-08-01 ENCOUNTER — Other Ambulatory Visit: Payer: Self-pay

## 2019-08-01 NOTE — Patient Outreach (Signed)
[facility]) Care Management  08/01/2019  Kristen Macias [birth year] [id number]   Telephone call to patient for disease management follow up. No answer.  HIPAA compliant voice message left.  Plan: RN CM will wait return call.  If no return call will attempt patient again in the month of September and send letter.  Jone Baseman, RN, MSN [place] Management Care Management Coordinator Direct Line [phone] Cell [phone] Toll Free: [phone]  Fax: [phone]

## 2019-08-03 ENCOUNTER — Ambulatory Visit: Payer: Self-pay

## 2019-09-06 ENCOUNTER — Ambulatory Visit: Payer: Medicare HMO

## 2019-09-06 ENCOUNTER — Other Ambulatory Visit: Payer: Self-pay

## 2019-09-18 ENCOUNTER — Other Ambulatory Visit: Payer: Self-pay | Admitting: Family Medicine

## 2019-09-18 DIAGNOSIS — J449 Chronic obstructive pulmonary disease, unspecified: Secondary | ICD-10-CM

## 2019-10-02 DIAGNOSIS — C678 Malignant neoplasm of overlapping sites of bladder: Secondary | ICD-10-CM | POA: Diagnosis not present

## 2019-11-01 ENCOUNTER — Other Ambulatory Visit: Payer: Self-pay

## 2019-11-01 NOTE — Patient Outreach (Signed)
[facility]) Care Management  [place]  11/01/2019   Kristen Macias [birth year] [id number]  Subjective: Telephone call to patient for disease management follow up. Patient reports she is doing well.  Patient reports no problems with her breathing.  Discussed COPD and inhaler use. She verbalized understanding. Patient reports she saw her cancer doctor recently and everything went well.  Patient reports using [employer] mail order for medications and voices no concerns. Encouraged patient to continue daily regimen of inhalers and pacing herself.  She verbalized understanding and voices no concerns.    Objective:   Encounter Medications:  Outpatient Encounter Medications as of 11/01/2019  Medication Sig   acetaminophen (TYLENOL) 500 MG tablet Take 500 mg by mouth every 6 (six) hours as needed.   albuterol (VENTOLIN HFA) 108 (90 Base) MCG/ACT inhaler INHALE 1 TO 2 PUFFS INTO THE LUNGS EVERY 6  HOURS AS NEEDED FOR WHEEZING OR SHORTNESS OF BREATH.   Calcium Carb-Cholecalciferol (CALCIUM-VITAMIN D) 600-400 MG-UNIT TABS Take 2 tablets by mouth daily.   metoprolol succinate (TOPROL-XL) 25 MG 24 hr tablet Take 1 tablet (25 mg total) by mouth daily.   metoprolol tartrate (LOPRESSOR) 25 MG tablet TAKE 1 TABLET TWICE DAILY   Multiple Vitamin (MULTIVITAMIN WITH MINERALS) TABS tablet Take 1 tablet by mouth daily. Centrum Silver   omeprazole (PRILOSEC) 20 MG capsule Take 1 capsule (20 mg total) by mouth daily.   phenazopyridine (PYRIDIUM) 200 MG tablet Take 1 tablet (200 mg total) by mouth 3 (three) times daily as needed for pain. (Patient not taking: Reported on 11/21/2018)   polyethylene glycol powder (GLYCOLAX/MIRALAX) powder Take 17 g by mouth daily. (Patient taking differently: Take 17 g by mouth as needed. )   SPIRIVA HANDIHALER 18 MCG inhalation capsule INHALE THE CONTENTS OF 1 CAPSULE EVERY DAY   No facility-administered encounter medications on file as of 11/01/2019.     Functional Status:  In your present state of health, do you have any difficulty performing the following activities: 11/21/2018  Hearing? N  Vision? Y  Comment cannot drive uses magnifying glass  Difficulty concentrating or making decisions? N  Walking or climbing stairs? N  Dressing or bathing? N  Doing errands, shopping? Y  Comment patient does not drive.  Neighbor or daughter take her  Conservation officer, nature and eating ? N  Using the Toilet? N  In the past six months, have you accidently leaked urine? N  Do you have problems with loss of bowel control? N  Managing your Medications? N  Managing your Finances? N  Housekeeping or managing your Housekeeping? N  Some recent data might be hidden    Fall/Depression Screening: Fall Risk  11/01/2019 05/11/2019 03/09/2019  Falls in the past year? 1 1 0  Number falls in past yr: 0 0 -  Injury with Fall? 0 0 -  Risk for fall due to : - - -   PHQ 2/9 Scores 11/01/2019 03/09/2019 11/21/2018 05/02/2018 11/30/2017 11/24/2017 08/20/2017  PHQ - 2 Score 0 0 0 0 0 0 1    Assessment: Patient continues to manage chronic health conditions and benefits from disease management.   Plan:  [facility] CM Care Plan Problem One     Most Recent Value  Care Plan Problem One Knowledge Deficit in self management of Copd  Role Documenting the Problem One Care Management Telephonic [place] for Problem One Active  THN Long Term Goal  Over the next 90 days, patient will demonstrate and/or  verbalize understanding of self-health management for long term care of COPD.   THN Long Term Goal Start Date 11/01/19  Interventions for Problem One Long Term Goal Patient reports green zone.  Discussed COPD maintenance and when to notify physician and seek help.    THN CM Short Term Goal #1  Patient will verbalize following up on health maintenance within 30 days.  THN CM Short Term Goal #1 Start Date 05/11/19  [facility] CM Short Term Goal #1 Met Date 11/01/19     RN CM will contact  patient in the month of December and patient agreeable.   Jone Baseman, RN, MSN [place] Management Care Management Coordinator Direct Line [phone] Cell [phone] Toll Free: [phone]  Fax: [phone]

## 2019-11-22 ENCOUNTER — Other Ambulatory Visit: Payer: Self-pay

## 2019-11-22 MED ORDER — ALBUTEROL SULFATE HFA 108 (90 BASE) MCG/ACT IN AERS
INHALATION_SPRAY | RESPIRATORY_TRACT | 3 refills | Status: DC
Start: 2019-11-22 — End: 2020-02-12

## 2019-12-01 ENCOUNTER — Other Ambulatory Visit: Payer: Self-pay | Admitting: Family Medicine

## 2019-12-01 DIAGNOSIS — R1319 Other dysphagia: Secondary | ICD-10-CM

## 2019-12-01 NOTE — Telephone Encounter (Signed)
Apologies- Rx request sent to [facility] RF pool - forwarded to PCP for review of request

## 2019-12-06 ENCOUNTER — Other Ambulatory Visit: Payer: Self-pay | Admitting: Family Medicine

## 2019-12-06 NOTE — Telephone Encounter (Signed)
   Notes to clinic I do not believe that this is a MD we approve Rx for. Please assess

## 2020-01-30 ENCOUNTER — Other Ambulatory Visit: Payer: Self-pay

## 2020-01-30 NOTE — Patient Instructions (Addendum)
Goals    . THN-Make and Keep All Appointments     Timeframe:  Long-Range Goal Priority:  High Start Date:   01/30/20                          Expected End Date:   05/23/20                       - ask family or friend for a ride - keep a calendar with appointment dates    Why is this important?    Part of staying healthy is seeing the doctor for follow-up care.   If you forget your appointments, there are some things you can do to stay on track.    Notes:     . THN-Track and Manage My Symptoms-COPD     Timeframe:  Long-Range Goal Priority:  High Start Date:   01/30/20                          Expected End Date: 05/23/20                       - follow rescue plan if symptoms flare-up - keep follow-up appointments    Why is this important?    Tracking your symptoms and other information about your health helps your doctor plan your care.   Write down the symptoms, the time of day, what you were doing and what medicine you are taking.   You will soon learn how to manage your symptoms.     Notes:        Chronic Obstructive Pulmonary Disease Exacerbation Chronic obstructive pulmonary disease (COPD) is a long-term (chronic) lung problem. In COPD, the flow of air from the lungs is limited. COPD exacerbations are times that breathing gets worse and you need more than your normal treatment. Without treatment, they can be life threatening. If they happen often, your lungs can become more damaged. If your COPD gets worse, your doctor may treat you with:  Medicines.  Oxygen.  Different ways to clear your airway, such as using a mask. Follow these instructions at home: Medicines  Take over-the-counter and prescription medicines only as told by your doctor.  If you take an antibiotic or steroid medicine, do not stop taking the medicine even if you start to feel better.  Keep up with shots (vaccinations) as told by your doctor. Be sure to get a yearly (annual) flu  shot. Lifestyle  Do not smoke. If you need help quitting, ask your doctor.  Eat healthy foods.  Exercise regularly.  Get plenty of sleep.  Avoid tobacco smoke and other things that can bother your lungs.  Wash your hands often with soap and water. This will help keep you from getting an infection. If you cannot use soap and water, use hand sanitizer.  During flu season, avoid areas that are crowded with people. General instructions  Drink enough fluid to keep your pee (urine) clear or pale yellow. Do not do this if your doctor has told you not to.  Use a cool mist machine (vaporizer).  If you use oxygen or a machine that turns medicine into a mist (nebulizer), continue to use it as told.  Follow all instructions for rehabilitation. These are steps you can take to make your body work better.  Keep all follow-up visits as told by your doctor.  This is important. Contact a doctor if:  Your COPD symptoms get worse than normal. Get help right away if:  You are short of breath and it gets worse.  You have trouble talking.  You have chest pain.  You cough up blood.  You have a fever.  You keep throwing up (vomiting).  You feel weak or you pass out (faint).  You feel confused.  You are not able to sleep because of your symptoms.  You are not able to do daily activities. Summary  COPD exacerbations are times that breathing gets worse and you need more treatment than normal.  COPD exacerbations can be very serious and may cause your lungs to become more damaged.  Do not smoke. If you need help quitting, ask your doctor.  Stay up-to-date on your shots. Get a flu shot every year. This information is not intended to replace advice given to you by your health care provider. Make sure you discuss any questions you have with your health care provider. Document Revised: 01/22/2017 Document Reviewed: 03/16/2016 Elsevier Patient Education  2020 [employer].

## 2020-01-30 NOTE — Patient Outreach (Signed)
Kristen Macias) Care Management  [place]  01/30/2020   Kristen Macias Dec 21, 1933 [id number]  Subjective: Telephone call to patient for disease management follow up. Patient reports she is doing good.  She states that she has not had any problems with her breathing. Discussed COPD and management.  She verbalized understanding.    Objective:   Encounter Medications:  Outpatient Encounter Medications as of 01/30/2020  Medication Sig  . acetaminophen (TYLENOL) 500 MG tablet Take 500 mg by mouth every 6 (six) hours as needed.  Marland Kitchen albuterol (VENTOLIN HFA) 108 (90 Base) MCG/ACT inhaler Inhale 1 to 2 puffs into the lungs every 6 hours as needed for wheezing or shortness of breath.  . Calcium Carb-Cholecalciferol (CALCIUM-VITAMIN D) 600-400 MG-UNIT TABS Take 2 tablets by mouth daily.  . metoprolol succinate (TOPROL-XL) 25 MG 24 hr tablet TAKE 1 TABLET EVERY DAY  . Multiple Vitamin (MULTIVITAMIN WITH MINERALS) TABS tablet Take 1 tablet by mouth daily. Centrum Silver  . omeprazole (PRILOSEC) 20 MG capsule TAKE 1 CAPSULE (20 MG TOTAL) BY MOUTH DAILY.  Marland Kitchen phenazopyridine (PYRIDIUM) 200 MG tablet Take 1 tablet (200 mg total) by mouth 3 (three) times daily as needed for pain. (Patient not taking: Reported on 11/21/2018)  . polyethylene glycol powder (GLYCOLAX/MIRALAX) powder Take 17 g by mouth daily. (Patient taking differently: Take 17 g by mouth as needed. )  . SPIRIVA HANDIHALER 18 MCG inhalation capsule INHALE THE CONTENTS OF 1 CAPSULE EVERY DAY   No facility-administered encounter medications on file as of 01/30/2020.    Functional Status:  In your present state of health, do you have any difficulty performing the following activities: 01/30/2020  Hearing? N  Vision? Y  Comment cannot drive uses magnifying glass  Difficulty concentrating or making decisions? N  Walking or climbing stairs? N  Dressing or bathing? N  Doing errands, shopping? Y  Comment patient does not  drive.  Neighbor or daughter take her  Conservation officer, nature and eating ? N  Using the Toilet? N  In the past six months, have you accidently leaked urine? N  Do you have problems with loss of bowel control? N  Managing your Medications? N  Managing your Finances? N  Housekeeping or managing your Housekeeping? N  Some recent data might be hidden    Fall/Depression Screening: Fall Risk  01/30/2020 11/01/2019 05/11/2019  Falls in the past year? 1 1 1   Number falls in past yr: 0 0 0  Injury with Fall? 0 0 0  Risk for fall due to : History of fall(s) - -  Follow up Falls prevention discussed - -   PHQ 2/9 Scores 01/30/2020 11/01/2019 03/09/2019 11/21/2018 05/02/2018 11/30/2017 11/24/2017  PHQ - 2 Score 1 0 0 0 0 0 0    Assessment: Patient continues to manage chronic conditions and benefits from disease management support outreach. Goals Addressed            This Visit's Progress   . THN-Make and Keep All Appointments       Timeframe:  Long-Range Goal Priority:  High Start Date:   01/30/20                          Expected End Date:   05/23/20                       - ask family or friend for a ride - keep a calendar with  appointment dates    Why is this important?    Part of staying healthy is seeing the doctor for follow-up care.   If you forget your appointments, there are some things you can do to stay on track.    Notes:     . THN-Track and Manage My Symptoms-COPD       Timeframe:  Long-Range Goal Priority:  High Start Date:   01/30/20                          Expected End Date: 05/23/20                       - follow rescue plan if symptoms flare-up - keep follow-up appointments    Why is this important?    Tracking your symptoms and other information about your health helps your doctor plan your care.   Write down the symptoms, the time of day, what you were doing and what medicine you are taking.   You will soon learn how to manage your symptoms.     Notes:         Plan: RN CM will contact patient in the month of March. Follow-up:  Patient agrees to Care Plan and Follow-up.   Jone Baseman, RN, MSN [place] [facility] Care Management Coordinator Direct Line [phone] Cell [phone] Toll Free: [phone]  Fax: [phone]

## 2020-02-09 ENCOUNTER — Other Ambulatory Visit: Payer: Self-pay | Admitting: Family Medicine

## 2020-03-13 ENCOUNTER — Other Ambulatory Visit: Payer: Self-pay | Admitting: Family Medicine

## 2020-03-13 DIAGNOSIS — E785 Hyperlipidemia, unspecified: Secondary | ICD-10-CM | POA: Diagnosis not present

## 2020-03-13 DIAGNOSIS — D6859 Other primary thrombophilia: Secondary | ICD-10-CM | POA: Diagnosis not present

## 2020-03-13 DIAGNOSIS — I82409 Acute embolism and thrombosis of unspecified deep veins of unspecified lower extremity: Secondary | ICD-10-CM | POA: Diagnosis not present

## 2020-03-13 DIAGNOSIS — R32 Unspecified urinary incontinence: Secondary | ICD-10-CM | POA: Diagnosis not present

## 2020-03-13 DIAGNOSIS — Z79899 Other long term (current) drug therapy: Secondary | ICD-10-CM | POA: Diagnosis not present

## 2020-03-13 DIAGNOSIS — Z1159 Encounter for screening for other viral diseases: Secondary | ICD-10-CM | POA: Diagnosis not present

## 2020-03-13 DIAGNOSIS — J449 Chronic obstructive pulmonary disease, unspecified: Secondary | ICD-10-CM | POA: Diagnosis not present

## 2020-03-13 DIAGNOSIS — Z23 Encounter for immunization: Secondary | ICD-10-CM | POA: Diagnosis not present

## 2020-03-13 DIAGNOSIS — B372 Candidiasis of skin and nail: Secondary | ICD-10-CM | POA: Diagnosis not present

## 2020-03-25 ENCOUNTER — Other Ambulatory Visit: Payer: Self-pay | Admitting: Family Medicine

## 2020-04-30 ENCOUNTER — Other Ambulatory Visit: Payer: Self-pay

## 2020-04-30 NOTE — Patient Outreach (Signed)
[facility]) Care Management  04/30/2020  KINSLEA FRANCES 09-29-1933 [id number]   Telephone call to patient for disease management follow up.   No answer.  HIPAA compliant voice message left.    Plan: If no return call, RN CM will attempt patient again in the month of June.    Jone Baseman, RN, MSN [place] [facility] Care Management Coordinator Direct Line [phone] Cell [phone] Toll Free: [phone]  Fax: [phone]

## 2020-05-27 ENCOUNTER — Telehealth: Payer: Self-pay

## 2020-05-27 NOTE — Telephone Encounter (Signed)
Received a fax from [place] for True Metrix blood Glucose MTR, [employer] True Metrix Test Strip, and Trueplus 33G Lacets. I dont see these medications on current med list. Salvatore Marvel, CMA

## 2020-05-27 NOTE — Telephone Encounter (Signed)
I also do not see these on her med list nor have I prescribed these. She has prediabetes and I do not expect she would need these supplies.

## 2020-06-08 ENCOUNTER — Other Ambulatory Visit: Payer: Self-pay | Admitting: Family Medicine

## 2020-06-13 ENCOUNTER — Other Ambulatory Visit: Payer: Self-pay | Admitting: Family Medicine

## 2020-07-30 ENCOUNTER — Other Ambulatory Visit: Payer: Self-pay

## 2020-07-30 NOTE — Patient Instructions (Signed)
Goals Addressed            This Visit's Progress   . COMPLETED: THN-Make and Keep All Appointments   On track    Timeframe:  Long-Range Goal Priority:  High Start Date:   01/30/20                          Expected End Date:   05/23/20                       - ask family or friend for a ride - keep a calendar with appointment dates    Why is this important?    Part of staying healthy is seeing the doctor for follow-up care.   If you forget your appointments, there are some things you can do to stay on track.    Notes:     . COMPLETED: THN-Track and Manage My Symptoms-COPD   On track    Timeframe:  Long-Range Goal Priority:  High Start Date:   01/30/20                          Expected End Date: 05/23/20                       - follow rescue plan if symptoms flare-up - keep follow-up appointments    Why is this important?    Tracking your symptoms and other information about your health helps your doctor plan your care.   Write down the symptoms, the time of day, what you were doing and what medicine you are taking.   You will soon learn how to manage your symptoms.     Notes:

## 2020-07-30 NOTE — Patient Outreach (Signed)
[facility]) Care Management  [place]  07/30/2020   Kristen Macias 10/05/1933 [id number]  Subjective: Telephone call to patient for disease management follow up. Patient reports she is doing good.  She denies any problems with her COPD.  She rarely uses rescue inhaler.  She reports that she has changed her PCP to [facility].  Discussed with patient that being that she is with [facility] as it is a care management program that [facility] would not contact any more.  She verbalized understanding.      Objective:   Encounter Medications:  Outpatient Encounter Medications as of 07/30/2020  Medication Sig  . acetaminophen (TYLENOL) 500 MG tablet Take 500 mg by mouth every 6 (six) hours as needed.  Marland Kitchen albuterol (VENTOLIN HFA) 108 (90 Base) MCG/ACT inhaler INHALE 1 TO 2 PUFFS EVERY 6 HOURS AS NEEDED FOR WHEEZING OR SHORTNESS OF BREATH  . Calcium Carb-Cholecalciferol (CALCIUM-VITAMIN D) 600-400 MG-UNIT TABS Take 2 tablets by mouth daily.  . metoprolol succinate (TOPROL-XL) 25 MG 24 hr tablet TAKE 1 TABLET EVERY DAY  . Multiple Vitamin (MULTIVITAMIN WITH MINERALS) TABS tablet Take 1 tablet by mouth daily. Centrum Silver  . omeprazole (PRILOSEC) 20 MG capsule TAKE 1 CAPSULE (20 MG TOTAL) BY MOUTH DAILY.  Marland Kitchen phenazopyridine (PYRIDIUM) 200 MG tablet Take 1 tablet (200 mg total) by mouth 3 (three) times daily as needed for pain. (Patient not taking: Reported on 11/21/2018)  . polyethylene glycol powder (GLYCOLAX/MIRALAX) powder Take 17 g by mouth daily. (Patient taking differently: Take 17 g by mouth as needed. )  . SPIRIVA HANDIHALER 18 MCG inhalation capsule INHALE THE CONTENTS OF 1 CAPSULE EVERY DAY   No facility-administered encounter medications on file as of 07/30/2020.    Functional Status:  In your present state of health, do you have any difficulty performing the following activities: 01/30/2020  Hearing? N  Vision? Y  Comment cannot drive uses  magnifying glass  Difficulty concentrating or making decisions? N  Walking or climbing stairs? N  Dressing or bathing? N  Doing errands, shopping? Y  Comment patient does not drive.  Neighbor or daughter take her  Conservation officer, nature and eating ? N  Using the Toilet? N  In the past six months, have you accidently leaked urine? N  Do you have problems with loss of bowel control? N  Managing your Medications? N  Managing your Finances? N  Housekeeping or managing your Housekeeping? N  Some recent data might be hidden    Fall/Depression Screening: Fall Risk  01/30/2020 11/01/2019 05/11/2019  Falls in the past year? 1 1 1   Number falls in past yr: 0 0 0  Injury with Fall? 0 0 0  Risk for fall due to : History of fall(s) - -  Follow up Falls prevention discussed - -   PHQ 2/9 Scores 01/30/2020 11/01/2019 03/09/2019 11/21/2018 05/02/2018 11/30/2017 11/24/2017  PHQ - 2 Score 1 0 0 0 0 0 0    Assessment:   Care Plan Care Plan : COPD (Adult)  Updates made by Jon Billings, RN since 07/30/2020 12:00 AM    Problem: Symptom Exacerbation (COPD)     Long-Range Goal: Symptom Exacerbation Prevented or Minimized Completed 07/30/2020  Start Date: 01/30/2020  Expected End Date: 05/23/2020  Priority: High  Note:   Evidence-based guidance:   Monitor for signs of respiratory infection, including changes in sputum color, volume and thickness, as well as fever.   Encourage infection prevention  strategies that may include prophylactic antibiotic therapy for patients with history of frequent exacerbations or antibiotic administration during exacerbation based on presentation, risk and benefit.   Encourage receipt of influenza and pneumococcal vaccine.   Prepare patient for use of home long-term oxygen therapy in presence of sever resting hypoxemia.   Prepare patients for laboratory studies or diagnostic exams, such as spirometry, pulse oximetry and arterial blood gas based on current symptoms, risk factors and  presentation.   Assess barriers and manage adherence, including inhaler technique and persistent trigger exposure; encourage adherence, even when symptoms are controlled or infrequent.   Assess and monitor for signs/symptoms of psychosocial concerns, such as shortness of breath-anxiety cycle or depression that may impact stability of symptoms.   Identify economic resources, sociocultural beliefs, social factors and health literacy that may interfere with adherence.   Promote lifestyle changes when needed, including regular physical activity based on tolerance, weight loss, healthy eating and stress management.   Consider referral to nurse or community health worker or home-visiting program for intensive support and education (disease-management program).   Increase frequency of follow-up following exacerbation or hospitalization; consider transition of care interventions, such as hospital visit, home visit, telephone follow-up, review of discharge summary and resource referrals.   Notes:    Task: Identify and Minimize Risk of COPD Exacerbation Completed 07/30/2020  Priority: Routine  Responsible User: Jon Billings, RN  Note:   Care Management Activities:    - breathing techniques encouraged - healthy lifestyle promoted - rescue (action) plan reviewed - signs/symptoms of worsening disease assessed    Notes:      Goals Addressed            This Visit's Progress   . COMPLETED: THN-Make and Keep All Appointments   On track    Timeframe:  Long-Range Goal Priority:  High Start Date:   01/30/20                          Expected End Date:   05/23/20                       - ask family or friend for a ride - keep a calendar with appointment dates    Why is this important?    Part of staying healthy is seeing the doctor for follow-up care.   If you forget your appointments, there are some things you can do to stay on track.    Notes:     . COMPLETED: THN-Track and Manage My  Symptoms-COPD   On track    Timeframe:  Long-Range Goal Priority:  High Start Date:   01/30/20                          Expected End Date: 05/23/20                       - follow rescue plan if symptoms flare-up - keep follow-up appointments    Why is this important?    Tracking your symptoms and other information about your health helps your doctor plan your care.   Write down the symptoms, the time of day, what you were doing and what medicine you are taking.   You will soon learn how to manage your symptoms.     Notes:        Plan: RN CM will close case.  Jone Baseman, RN, MSN [place] Management Care Management Coordinator Direct Line [phone] Cell [phone] Toll Free: [phone]  Fax: [phone]

## 2020-10-14 ENCOUNTER — Other Ambulatory Visit (HOSPITAL_COMMUNITY): Payer: Self-pay | Admitting: Urology

## 2020-10-14 ENCOUNTER — Other Ambulatory Visit: Payer: Self-pay

## 2020-10-14 ENCOUNTER — Ambulatory Visit (HOSPITAL_COMMUNITY)
Admission: RE | Admit: 2020-10-14 | Discharge: 2020-10-14 | Disposition: A | Discharge status: Home / Self Care | Payer: Medicare Other | Source: Ambulatory Visit | Attending: Urology | Admitting: Urology

## 2020-10-14 DIAGNOSIS — C678 Malignant neoplasm of overlapping sites of bladder: Secondary | ICD-10-CM | POA: Diagnosis not present

## 2021-03-18 ENCOUNTER — Ambulatory Visit: Payer: Self-pay

## 2021-03-18 ENCOUNTER — Encounter: Payer: Self-pay | Admitting: Orthopaedic Surgery

## 2021-03-18 ENCOUNTER — Ambulatory Visit (INDEPENDENT_AMBULATORY_CARE_PROVIDER_SITE_OTHER): Payer: Medicare Other | Admitting: Orthopaedic Surgery

## 2021-03-18 ENCOUNTER — Other Ambulatory Visit: Payer: Self-pay

## 2021-03-18 VITALS — Ht 63.0 in | Wt 193.0 lb

## 2021-03-18 DIAGNOSIS — M25511 Pain in right shoulder: Secondary | ICD-10-CM

## 2021-03-18 DIAGNOSIS — G8929 Other chronic pain: Secondary | ICD-10-CM

## 2021-03-18 MED ORDER — LIDOCAINE HCL 1 % IJ SOLN
5.0000 mL | INTRAMUSCULAR | Status: AC | PRN
  Administered 2021-03-18: 15:00:00 5 mL

## 2021-03-18 MED ORDER — METHYLPREDNISOLONE ACETATE 40 MG/ML IJ SUSP
40.0000 mg | INTRAMUSCULAR | Status: AC | PRN
  Administered 2021-03-18: 15:00:00 40 mg via INTRA_ARTICULAR
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
## 2021-06-16 ENCOUNTER — Ambulatory Visit (HOSPITAL_COMMUNITY)
Admission: RE | Admit: 2021-06-16 | Discharge: 2021-06-16 | Disposition: A | Discharge status: Home / Self Care | Payer: Medicare Other | Source: Ambulatory Visit | Attending: Urology | Admitting: Urology

## 2021-06-16 ENCOUNTER — Other Ambulatory Visit (HOSPITAL_COMMUNITY): Payer: Self-pay | Admitting: Urology

## 2021-06-16 DIAGNOSIS — C671 Malignant neoplasm of dome of bladder: Secondary | ICD-10-CM | POA: Insufficient documentation

## 2022-01-29 ENCOUNTER — Other Ambulatory Visit: Payer: Self-pay | Admitting: Student

## 2022-01-29 DIAGNOSIS — M79604 Pain in right leg: Secondary | ICD-10-CM

## 2022-01-29 DIAGNOSIS — Z86718 Personal history of other venous thrombosis and embolism: Secondary | ICD-10-CM

## 2022-02-10 ENCOUNTER — Ambulatory Visit
Admission: RE | Admit: 2022-02-10 | Discharge: 2022-02-10 | Disposition: A | Discharge status: Home / Self Care | Payer: Medicare Other | Source: Ambulatory Visit | Attending: Student | Admitting: Student

## 2022-02-10 DIAGNOSIS — M79604 Pain in right leg: Secondary | ICD-10-CM

## 2022-02-10 DIAGNOSIS — Z86718 Personal history of other venous thrombosis and embolism: Secondary | ICD-10-CM

## 2022-02-12 ENCOUNTER — Other Ambulatory Visit: Payer: Medicare Other

## 2022-02-22 ENCOUNTER — Other Ambulatory Visit: Payer: Self-pay

## 2022-02-22 ENCOUNTER — Encounter (HOSPITAL_COMMUNITY): Payer: Self-pay

## 2022-02-22 ENCOUNTER — Emergency Department (HOSPITAL_COMMUNITY): Payer: Medicare Other

## 2022-02-22 ENCOUNTER — Inpatient Hospital Stay (HOSPITAL_COMMUNITY)
Admission: EM | Admit: 2022-02-22 | Discharge: 2022-02-26 | DRG: 522 | Disposition: A | Discharge status: Home Health | Payer: Medicare Other | Attending: Family Medicine | Admitting: Family Medicine

## 2022-02-22 ENCOUNTER — Inpatient Hospital Stay (HOSPITAL_COMMUNITY): Payer: Medicare Other

## 2022-02-22 DIAGNOSIS — W010XXA Fall on same level from slipping, tripping and stumbling without subsequent striking against object, initial encounter: Secondary | ICD-10-CM | POA: Diagnosis present

## 2022-02-22 DIAGNOSIS — G2581 Restless legs syndrome: Secondary | ICD-10-CM | POA: Diagnosis present

## 2022-02-22 DIAGNOSIS — Z87891 Personal history of nicotine dependence: Secondary | ICD-10-CM | POA: Diagnosis not present

## 2022-02-22 DIAGNOSIS — S72001A Fracture of unspecified part of neck of right femur, initial encounter for closed fracture: Secondary | ICD-10-CM | POA: Diagnosis not present

## 2022-02-22 DIAGNOSIS — Z833 Family history of diabetes mellitus: Secondary | ICD-10-CM | POA: Diagnosis not present

## 2022-02-22 DIAGNOSIS — Z7901 Long term (current) use of anticoagulants: Secondary | ICD-10-CM | POA: Diagnosis not present

## 2022-02-22 DIAGNOSIS — E119 Type 2 diabetes mellitus without complications: Secondary | ICD-10-CM | POA: Diagnosis present

## 2022-02-22 DIAGNOSIS — M810 Age-related osteoporosis without current pathological fracture: Secondary | ICD-10-CM | POA: Diagnosis present

## 2022-02-22 DIAGNOSIS — W19XXXA Unspecified fall, initial encounter: Principal | ICD-10-CM

## 2022-02-22 DIAGNOSIS — Z86718 Personal history of other venous thrombosis and embolism: Secondary | ICD-10-CM | POA: Diagnosis not present

## 2022-02-22 DIAGNOSIS — R7303 Prediabetes: Secondary | ICD-10-CM | POA: Diagnosis present

## 2022-02-22 DIAGNOSIS — Z79899 Other long term (current) drug therapy: Secondary | ICD-10-CM | POA: Diagnosis not present

## 2022-02-22 DIAGNOSIS — Z8249 Family history of ischemic heart disease and other diseases of the circulatory system: Secondary | ICD-10-CM

## 2022-02-22 DIAGNOSIS — I4891 Unspecified atrial fibrillation: Secondary | ICD-10-CM | POA: Diagnosis present

## 2022-02-22 DIAGNOSIS — Z7984 Long term (current) use of oral hypoglycemic drugs: Secondary | ICD-10-CM

## 2022-02-22 DIAGNOSIS — K219 Gastro-esophageal reflux disease without esophagitis: Secondary | ICD-10-CM | POA: Diagnosis present

## 2022-02-22 DIAGNOSIS — I482 Chronic atrial fibrillation, unspecified: Secondary | ICD-10-CM | POA: Diagnosis present

## 2022-02-22 DIAGNOSIS — Z8551 Personal history of malignant neoplasm of bladder: Secondary | ICD-10-CM | POA: Diagnosis not present

## 2022-02-22 DIAGNOSIS — S72011A Unspecified intracapsular fracture of right femur, initial encounter for closed fracture: Secondary | ICD-10-CM | POA: Diagnosis present

## 2022-02-22 DIAGNOSIS — E871 Hypo-osmolality and hyponatremia: Secondary | ICD-10-CM | POA: Diagnosis present

## 2022-02-22 DIAGNOSIS — J449 Chronic obstructive pulmonary disease, unspecified: Secondary | ICD-10-CM | POA: Diagnosis present

## 2022-02-22 DIAGNOSIS — I700 Atherosclerosis of aorta: Secondary | ICD-10-CM | POA: Diagnosis present

## 2022-02-22 DIAGNOSIS — E785 Hyperlipidemia, unspecified: Secondary | ICD-10-CM | POA: Diagnosis present

## 2022-02-22 DIAGNOSIS — Z90710 Acquired absence of both cervix and uterus: Secondary | ICD-10-CM

## 2022-02-22 DIAGNOSIS — D649 Anemia, unspecified: Secondary | ICD-10-CM | POA: Diagnosis not present

## 2022-02-22 LAB — BASIC METABOLIC PANEL WITH GFR
Anion gap: 7 (ref 5–15)
BUN: 17 mg/dL (ref 8–23)
CO2: 25 mmol/L (ref 22–32)
Calcium: 8.7 mg/dL — ABNORMAL LOW (ref 8.9–10.3)
Chloride: 102 mmol/L (ref 98–111)
Creatinine, Ser: 1.08 mg/dL — ABNORMAL HIGH (ref 0.44–1.00)
GFR, Estimated: 50 mL/min — ABNORMAL LOW
Glucose, Bld: 118 mg/dL — ABNORMAL HIGH (ref 70–99)
Potassium: 4.1 mmol/L (ref 3.5–5.1)
Sodium: 134 mmol/L — ABNORMAL LOW (ref 135–145)

## 2022-02-22 LAB — CBC WITH DIFFERENTIAL/PLATELET
Abs Immature Granulocytes: 0.04 K/uL (ref 0.00–0.07)
Basophils Absolute: 0 K/uL (ref 0.0–0.1)
Basophils Relative: 1 %
Eosinophils Absolute: 0 K/uL (ref 0.0–0.5)
Eosinophils Relative: 1 %
HCT: 33.7 % — ABNORMAL LOW (ref 36.0–46.0)
Hemoglobin: 10.4 g/dL — ABNORMAL LOW (ref 12.0–15.0)
Immature Granulocytes: 1 %
Lymphocytes Relative: 9 %
Lymphs Abs: 0.5 K/uL — ABNORMAL LOW (ref 0.7–4.0)
MCH: 25.1 pg — ABNORMAL LOW (ref 26.0–34.0)
MCHC: 30.9 g/dL (ref 30.0–36.0)
MCV: 81.4 fL (ref 80.0–100.0)
Monocytes Absolute: 0.3 K/uL (ref 0.1–1.0)
Monocytes Relative: 5 %
Neutro Abs: 4.5 K/uL (ref 1.7–7.7)
Neutrophils Relative %: 83 %
Platelets: 183 K/uL (ref 150–400)
RBC: 4.14 MIL/uL (ref 3.87–5.11)
RDW: 15 % (ref 11.5–15.5)
WBC: 5.4 K/uL (ref 4.0–10.5)
nRBC: 0 % (ref 0.0–0.2)

## 2022-02-22 LAB — SURGICAL PCR SCREEN
MRSA, PCR: NEGATIVE
Staphylococcus aureus: POSITIVE — AB

## 2022-02-22 LAB — PROTIME-INR
INR: 1 (ref 0.8–1.2)
Prothrombin Time: 13.2 s (ref 11.4–15.2)

## 2022-02-22 LAB — GLUCOSE, CAPILLARY
Glucose-Capillary: 88 mg/dL (ref 70–99)
Glucose-Capillary: 92 mg/dL (ref 70–99)
Glucose-Capillary: 105 mg/dL — ABNORMAL HIGH (ref 70–99)

## 2022-02-22 MED ORDER — HYDROMORPHONE HCL 1 MG/ML IJ SOLN
0.5000 mg | INTRAMUSCULAR | Status: DC | PRN
  Administered 2022-02-22 – 2022-02-23 (×4): 0.5 mg via INTRAVENOUS
  Filled 2022-02-22 (×4): qty 0.5

## 2022-02-22 MED ORDER — ONDANSETRON HCL 4 MG/2ML IJ SOLN
4.0000 mg | Freq: Once | INTRAMUSCULAR | Status: AC
  Administered 2022-02-22: 4 mg via INTRAVENOUS
  Filled 2022-02-22: qty 2

## 2022-02-22 MED ORDER — POLYETHYLENE GLYCOL 3350 17 G PO PACK
17.0000 g | PACK | Freq: Every day | ORAL | Status: DC
  Administered 2022-02-22 – 2022-02-26 (×3): 17 g via ORAL
  Filled 2022-02-22 (×4): qty 1

## 2022-02-22 MED ORDER — POVIDONE-IODINE 10 % EX SWAB: 2.0000 | Freq: Once | CUTANEOUS | Status: DC

## 2022-02-22 MED ORDER — ALBUTEROL SULFATE (2.5 MG/3ML) 0.083% IN NEBU: 2.5000 mg | INHALATION_SOLUTION | RESPIRATORY_TRACT | Status: DC | PRN

## 2022-02-22 MED ORDER — METFORMIN HCL ER 500 MG PO TB24
500.0000 mg | ORAL_TABLET | Freq: Every evening | ORAL | Status: DC
  Administered 2022-02-22: 500 mg via ORAL
  Filled 2022-02-22: qty 1

## 2022-02-22 MED ORDER — TRAMADOL HCL 50 MG PO TABS: 50.0000 mg | ORAL_TABLET | Freq: Three times a day (TID) | ORAL | Status: DC | PRN

## 2022-02-22 MED ORDER — SENNOSIDES-DOCUSATE SODIUM 8.6-50 MG PO TABS
1.0000 | ORAL_TABLET | Freq: Two times a day (BID) | ORAL | Status: DC
  Administered 2022-02-22 (×2): 1 via ORAL
  Filled 2022-02-22 (×2): qty 1

## 2022-02-22 MED ORDER — CHLORHEXIDINE GLUCONATE 4 % EX LIQD: 60.0000 mL | Freq: Once | CUTANEOUS | Status: DC

## 2022-02-22 MED ORDER — ACETAMINOPHEN 650 MG RE SUPP: 650.0000 mg | Freq: Four times a day (QID) | RECTAL | Status: DC | PRN

## 2022-02-22 MED ORDER — TRAMADOL HCL 50 MG PO TABS: 50.0000 mg | ORAL_TABLET | Freq: Four times a day (QID) | ORAL | Status: DC | PRN

## 2022-02-22 MED ORDER — GABAPENTIN 100 MG PO CAPS
100.0000 mg | ORAL_CAPSULE | Freq: Two times a day (BID) | ORAL | Status: DC
  Administered 2022-02-22 – 2022-02-26 (×8): 100 mg via ORAL
  Filled 2022-02-22 (×8): qty 1

## 2022-02-22 MED ORDER — ONDANSETRON HCL 4 MG/2ML IJ SOLN: 4.0000 mg | Freq: Four times a day (QID) | INTRAMUSCULAR | Status: DC | PRN

## 2022-02-22 MED ORDER — ONDANSETRON HCL 4 MG PO TABS: 4.0000 mg | ORAL_TABLET | Freq: Four times a day (QID) | ORAL | Status: DC | PRN

## 2022-02-22 MED ORDER — MUPIROCIN 2 % EX OINT
1.0000 | TOPICAL_OINTMENT | Freq: Two times a day (BID) | CUTANEOUS | Status: DC
  Administered 2022-02-22 – 2022-02-26 (×8): 1 via NASAL
  Filled 2022-02-22: qty 22

## 2022-02-22 MED ORDER — TRANEXAMIC ACID-NACL 1000-0.7 MG/100ML-% IV SOLN
1000.0000 mg | INTRAVENOUS | Status: AC
  Administered 2022-02-23: 1000 mg via INTRAVENOUS
  Filled 2022-02-22: qty 100

## 2022-02-22 MED ORDER — ENOXAPARIN SODIUM 30 MG/0.3ML IJ SOSY
30.0000 mg | PREFILLED_SYRINGE | Freq: Once | INTRAMUSCULAR | Status: AC
  Administered 2022-02-22: 30 mg via SUBCUTANEOUS
  Filled 2022-02-22: qty 0.3

## 2022-02-22 MED ORDER — SODIUM CHLORIDE 0.9 % IV BOLUS
1000.0000 mL | Freq: Once | INTRAVENOUS | Status: AC
  Administered 2022-02-22: 1000 mL via INTRAVENOUS

## 2022-02-22 MED ORDER — ACETAMINOPHEN 325 MG PO TABS
650.0000 mg | ORAL_TABLET | Freq: Four times a day (QID) | ORAL | Status: DC | PRN
  Administered 2022-02-22 (×2): 650 mg via ORAL
  Filled 2022-02-22 (×2): qty 2

## 2022-02-22 MED ORDER — METOPROLOL SUCCINATE ER 25 MG PO TB24: 25.0000 mg | ORAL_TABLET | Freq: Every day | ORAL | Status: DC

## 2022-02-22 MED ORDER — CEFAZOLIN SODIUM-DEXTROSE 2-4 GM/100ML-% IV SOLN
2.0000 g | INTRAVENOUS | Status: AC
  Administered 2022-02-23: 2 g via INTRAVENOUS
  Filled 2022-02-22: qty 100

## 2022-02-22 MED ORDER — ACETAMINOPHEN 500 MG PO TABS: 1000.0000 mg | ORAL_TABLET | Freq: Once | ORAL | Status: DC

## 2022-02-22 MED ORDER — METOPROLOL SUCCINATE ER 50 MG PO TB24
50.0000 mg | ORAL_TABLET | Freq: Every day | ORAL | Status: DC
  Administered 2022-02-22 – 2022-02-26 (×4): 50 mg via ORAL
  Filled 2022-02-22 (×4): qty 1

## 2022-02-22 MED ORDER — HYDROMORPHONE HCL 1 MG/ML IJ SOLN
1.0000 mg | Freq: Once | INTRAMUSCULAR | Status: AC
  Administered 2022-02-22: 1 mg via INTRAVENOUS
  Filled 2022-02-22: qty 1

## 2022-02-22 MED ORDER — KETOROLAC TROMETHAMINE 15 MG/ML IJ SOLN
15.0000 mg | Freq: Once | INTRAMUSCULAR | Status: AC
  Administered 2022-02-22: 15 mg via INTRAVENOUS
  Filled 2022-02-22: qty 1

## 2022-02-22 MED ORDER — PROCHLORPERAZINE EDISYLATE 10 MG/2ML IJ SOLN
5.0000 mg | Freq: Once | INTRAMUSCULAR | Status: AC
  Administered 2022-02-22: 5 mg via INTRAVENOUS
  Filled 2022-02-22: qty 2

## 2022-02-22 MED ORDER — PANTOPRAZOLE SODIUM 40 MG PO TBEC
40.0000 mg | DELAYED_RELEASE_TABLET | Freq: Every day | ORAL | Status: DC
  Administered 2022-02-22 – 2022-02-26 (×4): 40 mg via ORAL
  Filled 2022-02-22 (×4): qty 1

## 2022-02-22 NOTE — Plan of Care (Signed)
  Problem: Education: Goal: Knowledge of General Education information will improve Description: Including pain rating scale, medication(s)/side effects and non-pharmacologic comfort measures Outcome: Progressing   Problem: Coping: Goal: Level of anxiety will decrease Outcome: Progressing   Problem: Pain Managment: Goal: General experience of comfort will improve Outcome: Progressing   
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
## 2022-02-22 NOTE — Plan of Care (Signed)
  Problem: Education: Goal: Knowledge of General Education information will improve Description Including pain rating scale, medication(s)/side effects and non-pharmacologic comfort measures Outcome: Progressing   
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
## 2022-02-22 NOTE — ED Triage Notes (Signed)
Pt. BIB [facility] for a mechanical fall. Pt. Did not hit her head per EMS. Denies taking blood thinners. pT. C/o r. Hip pain. EMS notes shortening to the right leg.

## 2022-02-22 NOTE — Progress Notes (Signed)
Consult received for displaced R femoral neck fracture. Will need surgery. Plan for OR tomorrow am. OK to have diet today; NPO after MN tonight. Will give OTO Lovenox now. [employer] DVT ppx. Full consult to follow.
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
## 2022-02-23 ENCOUNTER — Inpatient Hospital Stay (HOSPITAL_COMMUNITY): Payer: Medicare Other

## 2022-02-23 ENCOUNTER — Inpatient Hospital Stay (HOSPITAL_COMMUNITY): Payer: Medicare Other | Admitting: Certified Registered"

## 2022-02-23 ENCOUNTER — Other Ambulatory Visit: Payer: Self-pay

## 2022-02-23 ENCOUNTER — Encounter (HOSPITAL_COMMUNITY): Payer: Self-pay | Admitting: Family Medicine

## 2022-02-23 ENCOUNTER — Encounter (HOSPITAL_COMMUNITY)
Admission: EM | Disposition: A | Discharge status: Home Health | Payer: Self-pay | Source: Home / Self Care | Attending: Internal Medicine

## 2022-02-23 DIAGNOSIS — S72001A Fracture of unspecified part of neck of right femur, initial encounter for closed fracture: Secondary | ICD-10-CM

## 2022-02-23 DIAGNOSIS — Z87891 Personal history of nicotine dependence: Secondary | ICD-10-CM

## 2022-02-23 DIAGNOSIS — J449 Chronic obstructive pulmonary disease, unspecified: Secondary | ICD-10-CM

## 2022-02-23 DIAGNOSIS — I4891 Unspecified atrial fibrillation: Secondary | ICD-10-CM

## 2022-02-23 HISTORY — PX: HIP ARTHROPLASTY: SHX981

## 2022-02-23 LAB — COMPREHENSIVE METABOLIC PANEL
ALT: 18 U/L (ref 0–44)
AST: 26 U/L (ref 15–41)
Albumin: 3.2 g/dL — ABNORMAL LOW (ref 3.5–5.0)
Alkaline Phosphatase: 75 U/L (ref 38–126)
Anion gap: 8 (ref 5–15)
BUN: 15 mg/dL (ref 8–23)
CO2: 24 mmol/L (ref 22–32)
Calcium: 8.2 mg/dL — ABNORMAL LOW (ref 8.9–10.3)
Chloride: 99 mmol/L (ref 98–111)
Creatinine, Ser: 1.07 mg/dL — ABNORMAL HIGH (ref 0.44–1.00)
GFR, Estimated: 51 mL/min — ABNORMAL LOW (ref >60)
Glucose, Bld: 101 mg/dL — ABNORMAL HIGH (ref 70–99)
Potassium: 4.3 mmol/L (ref 3.5–5.1)
Sodium: 131 mmol/L — ABNORMAL LOW (ref 135–145)
Total Bilirubin: 0.4 mg/dL (ref 0.3–1.2)
Total Protein: 6.3 g/dL — ABNORMAL LOW (ref 6.5–8.1)

## 2022-02-23 LAB — CBC
HCT: 31.1 % — ABNORMAL LOW (ref 36.0–46.0)
Hemoglobin: 9.7 g/dL — ABNORMAL LOW (ref 12.0–15.0)
MCH: 25.7 pg — ABNORMAL LOW (ref 26.0–34.0)
MCHC: 31.2 g/dL (ref 30.0–36.0)
MCV: 82.3 fL (ref 80.0–100.0)
Platelets: 169 10*3/uL (ref 150–400)
RBC: 3.78 MIL/uL — ABNORMAL LOW (ref 3.87–5.11)
RDW: 15.1 % (ref 11.5–15.5)
WBC: 3.8 10*3/uL — ABNORMAL LOW (ref 4.0–10.5)
nRBC: 0 % (ref 0.0–0.2)

## 2022-02-23 LAB — GLUCOSE, CAPILLARY
Glucose-Capillary: 128 mg/dL — ABNORMAL HIGH (ref 70–99)
Glucose-Capillary: 144 mg/dL — ABNORMAL HIGH (ref 70–99)
Glucose-Capillary: 161 mg/dL — ABNORMAL HIGH (ref 70–99)

## 2022-02-23 SURGERY — HEMIARTHROPLASTY, HIP, DIRECT ANTERIOR APPROACH, FOR FRACTURE
Anesthesia: General | Site: Hip | Laterality: Right

## 2022-02-23 MED ORDER — ACETAMINOPHEN 500 MG PO TABS
500.0000 mg | ORAL_TABLET | Freq: Four times a day (QID) | ORAL | Status: AC
  Administered 2022-02-23 – 2022-02-24 (×4): 500 mg via ORAL
  Filled 2022-02-23 (×4): qty 1

## 2022-02-23 MED ORDER — ONDANSETRON HCL 4 MG PO TABS: 4.0000 mg | ORAL_TABLET | Freq: Four times a day (QID) | ORAL | Status: DC | PRN

## 2022-02-23 MED ORDER — ACETAMINOPHEN 10 MG/ML IV SOLN
INTRAVENOUS | Status: AC
  Filled 2022-02-23: qty 100

## 2022-02-23 MED ORDER — ISOPROPYL ALCOHOL 70 % SOLN
Status: AC
  Filled 2022-02-23: qty 480

## 2022-02-23 MED ORDER — ATORVASTATIN CALCIUM 20 MG PO TABS
20.0000 mg | ORAL_TABLET | Freq: Every day | ORAL | Status: DC
  Administered 2022-02-23 – 2022-02-26 (×4): 20 mg via ORAL
  Filled 2022-02-23 (×4): qty 1

## 2022-02-23 MED ORDER — ACETAMINOPHEN 325 MG PO TABS: 325.0000 mg | ORAL_TABLET | Freq: Four times a day (QID) | ORAL | Status: DC | PRN

## 2022-02-23 MED ORDER — DIPHENHYDRAMINE HCL 12.5 MG/5ML PO ELIX: 12.5000 mg | ORAL_SOLUTION | ORAL | Status: DC | PRN

## 2022-02-23 MED ORDER — ONDANSETRON HCL 4 MG/2ML IJ SOLN
4.0000 mg | Freq: Four times a day (QID) | INTRAMUSCULAR | Status: DC | PRN
  Administered 2022-02-25: 4 mg via INTRAVENOUS
  Filled 2022-02-23: qty 2

## 2022-02-23 MED ORDER — SUCCINYLCHOLINE CHLORIDE 200 MG/10ML IV SOSY
PREFILLED_SYRINGE | INTRAVENOUS | Status: AC
  Filled 2022-02-23: qty 10

## 2022-02-23 MED ORDER — APIXABAN 2.5 MG PO TABS
2.5000 mg | ORAL_TABLET | Freq: Two times a day (BID) | ORAL | Status: DC
  Administered 2022-02-24 – 2022-02-26 (×6): 2.5 mg via ORAL
  Filled 2022-02-23 (×6): qty 1

## 2022-02-23 MED ORDER — LACTATED RINGERS IV SOLN: INTRAVENOUS | Status: DC | PRN

## 2022-02-23 MED ORDER — SUGAMMADEX SODIUM 200 MG/2ML IV SOLN
INTRAVENOUS | Status: DC | PRN
  Administered 2022-02-23: 200 mg via INTRAVENOUS

## 2022-02-23 MED ORDER — HYDROCODONE-ACETAMINOPHEN 5-325 MG PO TABS
1.0000 | ORAL_TABLET | ORAL | Status: DC | PRN
  Administered 2022-02-24: 1 via ORAL
  Administered 2022-02-25: 2 via ORAL
  Filled 2022-02-23 (×2): qty 2
  Filled 2022-02-23: qty 1

## 2022-02-23 MED ORDER — ISOPROPYL ALCOHOL 70 % SOLN
Status: DC | PRN
  Administered 2022-02-23: 1 via TOPICAL

## 2022-02-23 MED ORDER — PHENOL 1.4 % MT LIQD: 1.0000 | OROMUCOSAL | Status: DC | PRN

## 2022-02-23 MED ORDER — SODIUM CHLORIDE 0.9 % IR SOLN
Status: DC | PRN
  Administered 2022-02-23: 1000 mL
  Administered 2022-02-23: 3000 mL

## 2022-02-23 MED ORDER — CEFAZOLIN SODIUM-DEXTROSE 2-4 GM/100ML-% IV SOLN
2.0000 g | Freq: Four times a day (QID) | INTRAVENOUS | Status: AC
  Administered 2022-02-23 (×2): 2 g via INTRAVENOUS
  Filled 2022-02-23 (×2): qty 100

## 2022-02-23 MED ORDER — FENTANYL CITRATE PF 50 MCG/ML IJ SOSY
25.0000 ug | PREFILLED_SYRINGE | INTRAMUSCULAR | Status: DC | PRN
  Administered 2022-02-23 (×2): 50 ug via INTRAVENOUS

## 2022-02-23 MED ORDER — SODIUM CHLORIDE 0.9 % IV SOLN: INTRAVENOUS | Status: DC

## 2022-02-23 MED ORDER — 0.9 % SODIUM CHLORIDE (POUR BTL) OPTIME
TOPICAL | Status: DC | PRN
  Administered 2022-02-23: 1000 mL

## 2022-02-23 MED ORDER — KETOROLAC TROMETHAMINE 30 MG/ML IJ SOLN
INTRAMUSCULAR | Status: DC | PRN
  Administered 2022-02-23: 30 mg

## 2022-02-23 MED ORDER — KETOROLAC TROMETHAMINE 30 MG/ML IJ SOLN
INTRAMUSCULAR | Status: AC
  Filled 2022-02-23: qty 1

## 2022-02-23 MED ORDER — SODIUM CHLORIDE (PF) 0.9 % IJ SOLN
INTRAMUSCULAR | Status: DC | PRN
  Administered 2022-02-23: 30 mL

## 2022-02-23 MED ORDER — ONDANSETRON HCL 4 MG/2ML IJ SOLN
INTRAMUSCULAR | Status: AC
  Filled 2022-02-23: qty 2

## 2022-02-23 MED ORDER — DEXAMETHASONE SODIUM PHOSPHATE 10 MG/ML IJ SOLN
INTRAMUSCULAR | Status: DC | PRN
  Administered 2022-02-23: 5 mg via INTRAVENOUS

## 2022-02-23 MED ORDER — SENNA 8.6 MG PO TABS
1.0000 | ORAL_TABLET | Freq: Two times a day (BID) | ORAL | Status: DC
  Administered 2022-02-23 – 2022-02-26 (×8): 8.6 mg via ORAL
  Filled 2022-02-23 (×8): qty 1

## 2022-02-23 MED ORDER — FENTANYL CITRATE PF 50 MCG/ML IJ SOSY
PREFILLED_SYRINGE | INTRAMUSCULAR | Status: AC
  Filled 2022-02-23: qty 2

## 2022-02-23 MED ORDER — OXYCODONE HCL 5 MG/5ML PO SOLN: 5.0000 mg | Freq: Once | ORAL | Status: DC | PRN

## 2022-02-23 MED ORDER — PHENYLEPHRINE 80 MCG/ML (10ML) SYRINGE FOR IV PUSH (FOR BLOOD PRESSURE SUPPORT)
PREFILLED_SYRINGE | INTRAVENOUS | Status: DC | PRN
  Administered 2022-02-23: 160 ug via INTRAVENOUS

## 2022-02-23 MED ORDER — ONDANSETRON HCL 4 MG/2ML IJ SOLN
INTRAMUSCULAR | Status: DC | PRN
  Administered 2022-02-23: 4 mg via INTRAVENOUS

## 2022-02-23 MED ORDER — POLYETHYLENE GLYCOL 3350 17 G PO PACK: 17.0000 g | PACK | Freq: Every day | ORAL | Status: DC | PRN

## 2022-02-23 MED ORDER — PROPOFOL 10 MG/ML IV BOLUS
INTRAVENOUS | Status: DC | PRN
  Administered 2022-02-23: 100 ug/kg/min via INTRAVENOUS
  Administered 2022-02-23: 70 mg via INTRAVENOUS

## 2022-02-23 MED ORDER — ALUM & MAG HYDROXIDE-SIMETH 200-200-20 MG/5ML PO SUSP
30.0000 mL | ORAL | Status: DC | PRN
  Administered 2022-02-23 (×2): 30 mL via ORAL
  Filled 2022-02-23 (×2): qty 30

## 2022-02-23 MED ORDER — DOCUSATE SODIUM 100 MG PO CAPS
100.0000 mg | ORAL_CAPSULE | Freq: Two times a day (BID) | ORAL | Status: DC
  Administered 2022-02-23 – 2022-02-26 (×8): 100 mg via ORAL
  Filled 2022-02-23 (×8): qty 1

## 2022-02-23 MED ORDER — FENTANYL CITRATE (PF) 100 MCG/2ML IJ SOLN
INTRAMUSCULAR | Status: DC | PRN
  Administered 2022-02-23 (×2): 50 ug via INTRAVENOUS

## 2022-02-23 MED ORDER — SODIUM CHLORIDE (PF) 0.9 % IJ SOLN
INTRAMUSCULAR | Status: AC
  Filled 2022-02-23: qty 50

## 2022-02-23 MED ORDER — SUCCINYLCHOLINE CHLORIDE 200 MG/10ML IV SOSY
PREFILLED_SYRINGE | INTRAVENOUS | Status: DC | PRN
  Administered 2022-02-23: 100 mg via INTRAVENOUS

## 2022-02-23 MED ORDER — ROCURONIUM BROMIDE 10 MG/ML (PF) SYRINGE
PREFILLED_SYRINGE | INTRAVENOUS | Status: DC | PRN
  Administered 2022-02-23: 40 mg via INTRAVENOUS
  Administered 2022-02-23: 10 mg via INTRAVENOUS

## 2022-02-23 MED ORDER — PHENYLEPHRINE HCL-NACL 20-0.9 MG/250ML-% IV SOLN
INTRAVENOUS | Status: DC | PRN
  Administered 2022-02-23: 50 ug/min via INTRAVENOUS

## 2022-02-23 MED ORDER — LACTATED RINGERS IV SOLN: Freq: Once | INTRAVENOUS | Status: AC

## 2022-02-23 MED ORDER — DEXAMETHASONE SODIUM PHOSPHATE 10 MG/ML IJ SOLN
INTRAMUSCULAR | Status: AC
  Filled 2022-02-23: qty 1

## 2022-02-23 MED ORDER — METHOCARBAMOL 500 MG IVPB - SIMPLE MED: 500.0000 mg | Freq: Four times a day (QID) | INTRAVENOUS | Status: DC | PRN

## 2022-02-23 MED ORDER — METHOCARBAMOL 500 MG PO TABS
500.0000 mg | ORAL_TABLET | Freq: Four times a day (QID) | ORAL | Status: DC | PRN
  Administered 2022-02-24 – 2022-02-25 (×4): 500 mg via ORAL
  Filled 2022-02-23 (×4): qty 1

## 2022-02-23 MED ORDER — ROCURONIUM BROMIDE 10 MG/ML (PF) SYRINGE
PREFILLED_SYRINGE | INTRAVENOUS | Status: AC
  Filled 2022-02-23: qty 10

## 2022-02-23 MED ORDER — MORPHINE SULFATE (PF) 2 MG/ML IV SOLN: 0.5000 mg | INTRAVENOUS | Status: DC | PRN

## 2022-02-23 MED ORDER — FENTANYL CITRATE (PF) 100 MCG/2ML IJ SOLN
INTRAMUSCULAR | Status: AC
  Filled 2022-02-23: qty 2

## 2022-02-23 MED ORDER — BUPIVACAINE HCL 0.25 % IJ SOLN
INTRAMUSCULAR | Status: AC
  Filled 2022-02-23: qty 1

## 2022-02-23 MED ORDER — PROPOFOL 10 MG/ML IV BOLUS
INTRAVENOUS | Status: AC
  Filled 2022-02-23: qty 20

## 2022-02-23 MED ORDER — HYDROCODONE-ACETAMINOPHEN 7.5-325 MG PO TABS
1.0000 | ORAL_TABLET | ORAL | Status: DC | PRN
  Administered 2022-02-24 (×2): 1 via ORAL
  Administered 2022-02-25 – 2022-02-26 (×4): 2 via ORAL
  Filled 2022-02-23 (×2): qty 2
  Filled 2022-02-23 (×2): qty 1
  Filled 2022-02-23 (×2): qty 2

## 2022-02-23 MED ORDER — METOCLOPRAMIDE HCL 5 MG PO TABS: 5.0000 mg | ORAL_TABLET | Freq: Three times a day (TID) | ORAL | Status: DC | PRN

## 2022-02-23 MED ORDER — MENTHOL 3 MG MT LOZG: 1.0000 | LOZENGE | OROMUCOSAL | Status: DC | PRN

## 2022-02-23 MED ORDER — METOCLOPRAMIDE HCL 5 MG/ML IJ SOLN: 5.0000 mg | Freq: Three times a day (TID) | INTRAMUSCULAR | Status: DC | PRN

## 2022-02-23 MED ORDER — ONDANSETRON HCL 4 MG/2ML IJ SOLN: 4.0000 mg | Freq: Once | INTRAMUSCULAR | Status: DC | PRN

## 2022-02-23 MED ORDER — CHLORHEXIDINE GLUCONATE 0.12 % MT SOLN: 15.0000 mL | Freq: Once | OROMUCOSAL | Status: DC

## 2022-02-23 MED ORDER — OXYCODONE HCL 5 MG PO TABS: 5.0000 mg | ORAL_TABLET | Freq: Once | ORAL | Status: DC | PRN

## 2022-02-23 MED ORDER — BUPIVACAINE HCL (PF) 0.25 % IJ SOLN
INTRAMUSCULAR | Status: DC | PRN
  Administered 2022-02-23: 30 mL

## 2022-02-23 MED ORDER — ACETAMINOPHEN 10 MG/ML IV SOLN
INTRAVENOUS | Status: DC | PRN
  Administered 2022-02-23: 1000 mg via INTRAVENOUS

## 2022-02-23 MED ORDER — LIDOCAINE 2% (20 MG/ML) 5 ML SYRINGE
INTRAMUSCULAR | Status: DC | PRN
  Administered 2022-02-23: 60 mg via INTRAVENOUS

## 2022-02-23 SURGICAL SUPPLY — 54 items
BAG COUNTER SPONGE SURGICOUNT (BAG) IMPLANT
BAG ZIPLOCK 12X15 (MISCELLANEOUS) IMPLANT
CHLORAPREP W/TINT 26 (MISCELLANEOUS) ×1 IMPLANT
COVER PERINEAL POST (MISCELLANEOUS) ×1 IMPLANT
COVER SURGICAL LIGHT HANDLE (MISCELLANEOUS) ×1 IMPLANT
DERMABOND ADVANCED .7 DNX12 (GAUZE/BANDAGES/DRESSINGS) ×2 IMPLANT
DRAPE IMP U-DRAPE 54X76 (DRAPES) ×1 IMPLANT
DRAPE SHEET LG 3/4 BI-LAMINATE (DRAPES) ×2 IMPLANT
DRAPE STERI IOBAN 125X83 (DRAPES) ×1 IMPLANT
DRAPE U-SHAPE 47X51 STRL (DRAPES) ×2 IMPLANT
DRSG AQUACEL AG ADV 3.5X10 (GAUZE/BANDAGES/DRESSINGS) ×1 IMPLANT
DRSG TEGADERM 4X4.75 (GAUZE/BANDAGES/DRESSINGS) IMPLANT
ELECT REM PT RETURN 15FT ADLT (MISCELLANEOUS) ×2 IMPLANT
GAUZE SPONGE 2X2 8PLY STRL LF (GAUZE/BANDAGES/DRESSINGS) IMPLANT
GAUZE SPONGE 4X4 12PLY STRL (GAUZE/BANDAGES/DRESSINGS) ×1 IMPLANT
GLOVE BIO SURGEON STRL SZ8.5 (GLOVE) ×2 IMPLANT
GLOVE BIOGEL M 7.0 STRL (GLOVE) ×1 IMPLANT
GLOVE BIOGEL PI IND STRL 7.5 (GLOVE) ×1 IMPLANT
GLOVE BIOGEL PI IND STRL 8 (GLOVE) ×1 IMPLANT
GLOVE BIOGEL PI IND STRL 8.5 (GLOVE) ×1 IMPLANT
GLOVE SURG LX STRL 8.0 MICRO (GLOVE) ×2 IMPLANT
GOWN SPEC L3 XXLG W/TWL (GOWN DISPOSABLE) ×1 IMPLANT
GOWN STRL REUS W/ TWL LRG LVL3 (GOWN DISPOSABLE) ×1 IMPLANT
GOWN STRL REUS W/TWL LRG LVL3 (GOWN DISPOSABLE) ×1
HANDPIECE INTERPULSE COAX TIP (DISPOSABLE)
HEAD MOD COCR 28MM HD -6MM NK (Orthopedic Implant) IMPLANT
HOOD PEEL AWAY T7 (MISCELLANEOUS) ×2 IMPLANT
JET LAVAGE IRRISEPT WOUND (IRRIGATION / IRRIGATOR)
KIT TURNOVER KIT A (KITS) IMPLANT
LAVAGE JET IRRISEPT WOUND (IRRIGATION / IRRIGATOR) IMPLANT
MANIFOLD NEPTUNE II (INSTRUMENTS) ×1 IMPLANT
MARKER SKIN DUAL TIP RULER LAB (MISCELLANEOUS) ×1 IMPLANT
NDL SPNL 18GX3.5 QUINCKE PK (NEEDLE) ×1 IMPLANT
NEEDLE SPNL 18GX3.5 QUINCKE PK (NEEDLE) ×1 IMPLANT
PACK ANTERIOR HIP CUSTOM (KITS) ×1 IMPLANT
PENCIL SMOKE EVACUATOR (MISCELLANEOUS) IMPLANT
RINGBLOC BI POLAR 28X48MM (Orthopedic Implant) ×1 IMPLANT
SAW OSC TIP CART 19.5X105X1.3 (SAW) ×1 IMPLANT
SEALER BIPOLAR AQUA 6.0 (INSTRUMENTS) IMPLANT
SET HNDPC FAN SPRY TIP SCT (DISPOSABLE) IMPLANT
SHELL RINGBLOC BI POLR 28X48MM (Orthopedic Implant) IMPLANT
SPIKE FLUID TRANSFER (MISCELLANEOUS) ×1 IMPLANT
STEM FEM CMTL 15X150 133D (Stem) IMPLANT
SUT ETHIBOND NAB CT1 #1 30IN (SUTURE) ×2 IMPLANT
SUT MNCRL AB 3-0 PS2 18 (SUTURE) ×1 IMPLANT
SUT MNCRL AB 4-0 PS2 18 (SUTURE) ×1 IMPLANT
SUT MON AB 2-0 CT1 36 (SUTURE) ×2 IMPLANT
SUT STRATAFIX PDO 1 14 VIOLET (SUTURE) ×1
SUT STRATFX PDO 1 14 VIOLET (SUTURE) ×1
SUT VIC AB 2-0 CT1 27 (SUTURE) ×1
SUT VIC AB 2-0 CT1 TAPERPNT 27 (SUTURE) ×1 IMPLANT
SUT VLOC 180 0 24IN GS25 (SUTURE) ×1 IMPLANT
SUTURE STRATFX PDO 1 14 VIOLET (SUTURE) IMPLANT
SYR 50ML LL SCALE MARK (SYRINGE) IMPLANT

## 2022-02-23 NOTE — Progress Notes (Signed)
PROGRESS NOTE    Kristen Macias  EXH:[id number] DOB: 09-17-1933 DOA: 02/22/2022 PCP: Cipriano Mile, NP   Brief Narrative:  89 y.o. female with medical history significant of aortic atherosclerosis, chronic atrial fibrillation not on anticoagulation, biatrial enlargement, bilateral cataracts, lower extremity edema, bladder cancer, cholelithiasis, T7 compression fracture, COPD, diverticulosis, GERD, history of DVT, hiatal hernia, hyperlipidemia, iron deficiency anemia, pyelonephritis, osteoarthritis, class I obesity, osteoporosis, history of pneumonia, pulmonary nodule, restless leg syndrome, rotator cuff tear, tricuspid regurgitation, urinary incontinence and emergency who had a mechanical fall home injuring her right hip with inability to bear weight on the RLE.  She denied any prodromal symptoms before her fall.  No fever, chills or night sweats. No sore throat, rhinorrhea, dyspnea, wheezing or hemoptysis.  No chest pain, palpitations, diaphoresis, PND, orthopnea or recent pitting edema of the lower extremities.  No appetite changes, abdominal pain, diarrhea, melena or hematochezia.  She gets occasionally constipated.  No flank pain, dysuria, frequency or hematuria.  No polyuria, polydipsia, polyphagia or blurred vision.   ED course: Initial vital signs were temperature  97.7 F, pulse 91, respiration 20, BP 154/94 mmHg O2 sat 92%.  The patient received hydromorphone 1 mg IVP and ondansetron 4 mg IVP in the ED.   Lab work: CBC showed a white count of 5.4, hemoglobin 10.4 g/dL platelets 183.  Normal PT and INR.  BMP showed a calcium of 8.7, glucose of 119, BUN 17 and creatinine 1.08 mg/dL.  The rest of the electrolytes are normal after sodium correction.   Imaging: Right hip fracture with an acute, impacted right femoral neck fracture.  Portable 1 view chest radiograph with mildly increased pulmonary interstitial and likely due to vascular congestion or due to being a supine radiograph.  Chronic moderate  gastric hiatal hernia.  No other acute findings  Assessment & Plan:   Principal Problem:   Closed right hip fracture, initial encounter ([facility]) Active Problems:   GERD (gastroesophageal reflux disease)   Prediabetes   Hyperlipidemia   Atrial fibrillation (HCC)   COPD (chronic obstructive pulmonary disease) (HCC)   Right hip fracture s/p right hip hemiarthroplasty Pt eval Pain control Bowel regime Dvt prophylaxis per ortho  Jerrye Bushy continue protonix   Afib rate controlled on beta blocker   Hyperlipedemia on lipitor   Copd stable Nebs  Dm on metformin  Anemia monitor Mild hyponatremia Monitor      Estimated body mass index is 34.17 kg/m as calculated from the following:   Height as of this encounter: '5\' 3"'$  (1.6 m).   Weight as of this encounter: 87.5 kg.  DVT prophylaxis: scd Code Status: full Family Communication: family at bed side Disposition Plan:  Status is: Inpatient Remains inpatient appropriate because: hip fracture   Consultants: ortho  Procedures: right hip hemiarthroplasty Antimicrobials:none  Subjective: C/o acid reflux   Objective: Vitals:   02/23/22 1000 02/23/22 1015 02/23/22 1038 02/23/22 1229  BP: 103/67 110/63 116/67 103/71  Pulse: 90 91 98 83  Resp: '13 12  16  '$ Temp:  97.6 F (36.4 C) 97.8 F (36.6 C) 97.9 F (36.6 C)  TempSrc:   Oral Oral  SpO2: 93% 92% 98% 99%  Weight:      Height:        Intake/Output Summary (Last 24 hours) at 02/23/2022 1538 Last data filed at 02/23/2022 1300 Gross per 24 hour  Intake 2780 ml  Output 1000 ml  Net 1780 ml   Filed Weights   02/22/22 0223 02/23/22 0750  Weight:  87.5 kg 87.5 kg    Examination:  General exam: Appears in nad  Respiratory system: Clear to auscultation. Respiratory effort normal. Cardiovascular system: S1 & S2 heard, RRR. No JVD, murmurs, rubs, gallops or clicks. No pedal edema. Gastrointestinal system: Abdomen is nondistended, soft and nontender. No organomegaly or masses  felt. Normal bowel sounds heard. Central nervous system: Alert and oriented. No focal neurological deficits. Extremities:right hip dressings on cdi.   Data Reviewed: I have personally reviewed following labs and imaging studies  CBC: Recent Labs  Lab 02/22/22 0413 02/23/22 0349  WBC 5.4 3.8*  NEUTROABS 4.5  --   HGB 10.4* 9.7*  HCT 33.7* 31.1*  MCV 81.4 82.3  PLT 183 938   Basic Metabolic Panel: Recent Labs  Lab 02/22/22 0413 02/23/22 0349  NA 134* 131*  K 4.1 4.3  CL 102 99  CO2 25 24  GLUCOSE 118* 101*  BUN 17 15  CREATININE 1.08* 1.07*  CALCIUM 8.7* 8.2*   GFR: Estimated Creatinine Clearance: 39.6 mL/min (A) (by C-G formula based on SCr of 1.07 mg/dL (H)). Liver Function Tests: Recent Labs  Lab 02/23/22 0349  AST 26  ALT 18  ALKPHOS 75  BILITOT 0.4  PROT 6.3*  ALBUMIN 3.2*   No results for input(s): "LIPASE", "AMYLASE" in the last 168 hours. No results for input(s): "AMMONIA" in the last 168 hours. Coagulation Profile: Recent Labs  Lab 02/22/22 0413  INR 1.0   Cardiac Enzymes: No results for input(s): "CKTOTAL", "CKMB", "CKMBINDEX", "TROPONINI" in the last 168 hours. BNP (last 3 results) No results for input(s): "PROBNP" in the last 8760 hours. HbA1C: No results for input(s): "HGBA1C" in the last 72 hours. CBG: Recent Labs  Lab 02/22/22 1218 02/22/22 1615 02/22/22 2239 02/23/22 1212  GLUCAP 88 92 105* 128*   Lipid Profile: No results for input(s): "CHOL", "HDL", "LDLCALC", "TRIG", "CHOLHDL", "LDLDIRECT" in the last 72 hours. Thyroid Function Tests: No results for input(s): "TSH", "T4TOTAL", "FREET4", "T3FREE", "THYROIDAB" in the last 72 hours. Anemia Panel: No results for input(s): "VITAMINB12", "FOLATE", "FERRITIN", "TIBC", "IRON", "RETICCTPCT" in the last 72 hours. Sepsis Labs: No results for input(s): "PROCALCITON", "LATICACIDVEN" in the last 168 hours.  Recent Results (from the past 240 hour(s))  Surgical PCR screen     Status:  Abnormal   Collection Time: 02/22/22  8:52 AM   Specimen: Nasal Mucosa; Nasal Swab  Result Value Ref Range Status   MRSA, PCR NEGATIVE NEGATIVE Final   Staphylococcus aureus POSITIVE (A) NEGATIVE Final    Comment: (NOTE) The Xpert SA Assay (FDA approved for NASAL specimens in patients 32 years of age and older), is one component of a comprehensive surveillance program. It is not intended to diagnose infection nor to guide or monitor treatment. Performed at [facility], [place] [address], [place], [place] [address]          Radiology Studies: DG HIP UNILAT WITH PELVIS 1V RIGHT  Result Date: 02/23/2022 CLINICAL DATA:  89 year old female status post right hip arthroplasty. EXAM: DG HIP (WITH OR WITHOUT PELVIS) 1V RIGHT; DG C-ARM 1-60 MIN-NO REPORT COMPARISON:  02/22/2022. FINDINGS: Multiple intraoperative views of the bony pelvis and right hip demonstrate interval performance of a right hip hemiarthroplasty. The prosthesis appears well seated without definite periprosthetic fracture or other immediate complicating features. Two views provided show the prosthetic femoral head projecting within the right acetabulum. IMPRESSION: 1. Intraoperative views from right hip hemiarthroplasty, as above, with no immediate complicating features. Electronically Signed   By:  Vinnie Langton M.D.   On: 02/23/2022 10:33   DG Pelvis Portable  Result Date: 02/23/2022 CLINICAL DATA:  89 year old female status post right hip arthroplasty. EXAM: PORTABLE PELVIS 1-2 VIEWS COMPARISON:  Intraoperative films 02/23/2022. FINDINGS: Postoperative changes of right hip hemiarthroplasty are noted. Prosthetic femoral head projects over the region of the right acetabulum on this single view examination. Small amount of gas in the joint space extensive gas in the overlying soft tissues. Skin staples lateral to the right proximal femur. Bony pelvis and left proximal femur are unremarkable in appearance.  IMPRESSION: 1. Expected postoperative findings of recent right hip hemiarthroplasty, as above, without immediate complicating features. Electronically Signed   By: Vinnie Langton M.D.   On: 02/23/2022 10:31   DG C-Arm 1-60 Min-No Report  Result Date: 02/23/2022 Fluoroscopy was utilized by the requesting physician.  No radiographic interpretation.   DG Knee Right Port  Result Date: 02/22/2022 CLINICAL DATA:  Preoperative for right hip fracture. EXAM: PORTABLE RIGHT KNEE - 1-2 VIEW COMPARISON:  Pelvis and right hip radiographs 02/22/2022, MRI right knee 10/22/2012 FINDINGS: There is diffuse decreased bone mineralization. Mild peripheral medial and lateral compartment degenerative spurring. Mild to moderate superior and mild inferior patellar degenerative osteophytes. The lateral view is mildly oblique. No joint effusion is seen. No acute fracture is seen. No dislocation. IMPRESSION: Mild-to-moderate patellofemoral and mild medial and lateral compartment osteoarthritis. Electronically Signed   By: Yvonne Kendall M.D.   On: 02/22/2022 11:53   DG Chest 1 View  Result Date: 02/22/2022 CLINICAL DATA:  89 year old female status post fall with right femur fracture. EXAM: CHEST  1 VIEW COMPARISON:  Chest radiographs 06/16/2021 and earlier. FINDINGS: Supine AP view at 0454 hours. Moderate chronic gastric hiatal hernia. Cardiac size now at the upper limits of normal. Other mediastinal contours are within normal limits. Visualized tracheal air column is within normal limits. Diffuse increased pulmonary interstitium. No pneumothorax, pleural effusion, or confluent pulmonary opacity. Paucity bowel gas in the visible abdomen. No acute osseous abnormality identified. IMPRESSION: 1. Mildly increased pulmonary interstitium diffusely, favor vascular congestion due to supine radiograph but difficult to exclude developing interstitial edema. 2. No other acute cardiopulmonary abnormality identified. Chronic moderate gastric  hiatal hernia. Electronically Signed   By: Genevie Ann M.D.   On: 02/22/2022 05:20   DG HIP UNILAT W OR W/O PELVIS 2-3 VIEWS LEFT  Result Date: 02/22/2022 CLINICAL DATA:  89 year old female status post fall. EXAM: DG HIP (WITH OR WITHOUT PELVIS) 2-3V LEFT COMPARISON:  Hip series 05/21/2008. FINDINGS: AP and cross-table lateral views at 0456 hours. Impacted right femoral neck fracture. Proximal right femur intertrochanteric segment appears to remain intact and right femoral head appears to remain normally located. Visible lower pelvis appears stable and intact. Grossly intact proximal left femur. Negative visible bowel gas. IMPRESSION: Acute, impacted right femoral neck fracture. Electronically Signed   By: Genevie Ann M.D.   On: 02/22/2022 05:18        Scheduled Meds:  acetaminophen  500 mg Oral Q6H   [START ON 02/24/2022] apixaban  2.5 mg Oral Q12H   atorvastatin  20 mg Oral QHS   docusate sodium  100 mg Oral BID   fentaNYL       gabapentin  100 mg Oral BID   metoprolol succinate  50 mg Oral Daily   mupirocin ointment  1 Application Nasal BID   pantoprazole  40 mg Oral Daily   polyethylene glycol  17 g Oral Daily   senna  1 tablet Oral BID   Continuous Infusions:  sodium chloride 100 mL/hr at 02/23/22 1138    ceFAZolin (ANCEF) IV 2 g (02/23/22 1432)   methocarbamol (ROBAXIN) IV       LOS: 1 day    Time spent: 38 min  Georgette Shell, MD 02/23/2022, 3:38 PM

## 2022-02-23 NOTE — Progress Notes (Signed)
Pt has returned to 1337 from PACU s/p bipolar right hip arthroplasty.  Report accepted from [facility]. Pt remains alert and oriented.  Multiple family members at bedside.  Call bell is within reach.  Care continues.

## 2022-02-23 NOTE — Transfer of Care (Signed)
Immediate Anesthesia Transfer of Care Note  Patient: Kristen Macias  Procedure(s) Performed: ARTHROPLASTY BIPOLAR HIP (HEMIARTHROPLASTY) (Right: Hip)  Patient Location: PACU  Anesthesia Type:General  Level of Consciousness: awake, alert , oriented, and patient cooperative  Airway & Oxygen Therapy: Patient Spontanous Breathing and Patient connected to face mask oxygen  Post-op Assessment: Report given to RN and Post -op Vital signs reviewed and stable  Post vital signs: Reviewed and stable  Last Vitals:  Vitals Value Taken Time  BP 121/83 02/23/22 0939  Temp    Pulse 84 02/23/22 0942  Resp 20 02/23/22 0942  SpO2 100 % 02/23/22 0942  Vitals shown include unvalidated device data.  Last Pain:  Vitals:   02/23/22 0750  TempSrc:   PainSc: 10-Worst pain ever      Patients Stated Pain Goal: 0 (88/67/73 7366)  Complications: No notable events documented.

## 2022-02-23 NOTE — Anesthesia Preprocedure Evaluation (Signed)
Anesthesia Evaluation  Patient identified by MRN, date of birth, ID band Patient awake    Reviewed: Allergy & Precautions, H&P , NPO status , Patient's Chart, lab work & pertinent test results  Airway Mallampati: II  TM Distance: >3 FB Neck ROM: Full    Dental  (+) Upper Dentures, Lower Dentures   Pulmonary COPD, former smoker   Pulmonary exam normal breath sounds clear to auscultation       Cardiovascular + Peripheral Vascular Disease  + dysrhythmias Atrial Fibrillation + Valvular Problems/Murmurs MR  Rhythm:Irregular Rate:Normal + Systolic murmurs    Neuro/Psych negative neurological ROS  negative psych ROS   GI/Hepatic Neg liver ROS,GERD  ,,  Endo/Other  negative endocrine ROS    Renal/GU negative Renal ROS  negative genitourinary   Musculoskeletal negative musculoskeletal ROS (+)    Abdominal   Peds negative pediatric ROS (+)  Hematology negative hematology ROS (+)   Anesthesia Other Findings   Reproductive/Obstetrics negative OB ROS                             Anesthesia Physical Anesthesia Plan  ASA: 3 and emergent  Anesthesia Plan: General   Post-op Pain Management: Ofirmev IV (intra-op)*   Induction: Intravenous  PONV Risk Score and Plan: 3 and Ondansetron, Dexamethasone and Treatment may vary due to age or medical condition  Airway Management Planned: Oral ETT  Additional Equipment:   Intra-op Plan:   Post-operative Plan: Extubation in OR  Informed Consent: I have reviewed the patients History and Physical, chart, labs and discussed the procedure including the risks, benefits and alternatives for the proposed anesthesia with the patient or authorized representative who has indicated his/her understanding and acceptance.     Dental advisory given  Plan Discussed with: CRNA and Surgeon  Anesthesia Plan Comments:        Anesthesia Quick Evaluation

## 2022-02-23 NOTE — Op Note (Signed)
OPERATIVE REPORT  SURGEON: Rod Can, MD   ASSISTANT: Larene Pickett, PA-C  PREOPERATIVE DIAGNOSIS: Displaced Right femoral neck fracture.   POSTOPERATIVE DIAGNOSIS: Displaced Right femoral neck fracture.   PROCEDURE: Right hip hemiarthroplasty, anterior approach.   IMPLANTS: Biomet Taperloc Complete Reduced Distal stem, size 15 x 161m, high offset, with a 28 - 6 mm metal head ball and a 48 mm bipolar articulation.  ANESTHESIA:  General  ANTIBIOTICS: 2g ancef.  ESTIMATED BLOOD LOSS:-450 mL    DRAINS: None.  COMPLICATIONS: None   CONDITION: PACU - hemodynamically stable.   BRIEF CLINICAL NOTE: Kristen DARNOLDis a 89y.o. female with a displaced Right femoral neck fracture. The patient was admitted to the hospitalist service and underwent perioperative risk stratification and medical optimization. The risks, benefits, and alternatives to hemiarthroplasty were explained, and the patient elected to proceed.  PROCEDURE IN DETAIL: The patient was taken to the operating room and general anesthesia was induced on the hospital bed.  The patient was then positioned on the Hana table.  All bony prominences were well padded.  The hip was prepped and draped in the normal sterile surgical fashion.  A time-out was called verifying side and site of surgery. Antibiotics were given within 60 minutes of beginning the procedure.   Bikini incision was made, and the direct anterior approach to the hip was performed through the Hueter interval.  Superficial dissection was carried out lateral to the ASIS. Lateral femoral circumflex vessels were treated with the Auqumantys. The anterior capsule was exposed and an inverted T capsulotomy was made. Fracture hematoma was encountered and evacuated. The patient was found to have a comminuted Right subcapital femoral neck fracture.  Inferior pubofemoral ligament was released subperiosteally to the lesser trochanter. I freshened the femoral neck cut with a saw.  I  removed the femoral neck fragment.  A corkscrew was placed into the head and the head was removed.  This was passed to the back table and was measured.   Acetabular exposure was achieved.  I examined the articular cartilage which was intact.  The labrum was intact. A 48 mm trial head was placed and found to have excellent fit.   I then gained femoral exposure taking care to protect the abductors and greater trochanter.  The superior capsule was incised longitudinally, staying lateral to the posterior border of the femoral neck. External rotation, extension, and adduction were applied.  A cookie cutter was used to enter the femoral canal, and then the femoral canal finder was used to confirm location.  I then sequentially broached up to a size 15.  Calcar planer was used on the femoral neck remnant.  I placed a high offset neck and a 48 mm bipolar trial construct. The hip was reduced.  Leg lengths were checked fluoroscopically.  The hip was dislocated and trial components were removed.  I placed the real stem followed by the real bipolar construct.  A single reduction maneuver was performed and the hip was reduced.  Fluoroscopy was used to confirm component position and leg lengths.  At 90 degrees of external rotation and extension, the hip was stable to an anterior directed force.   The wound was copiously irrigated with Irrisept solution and normal saline using pulse lavage.  Marcaine solution was injected into the periarticular soft tissue.  The wound was closed in layers using #1 Stratafix for the fascia, 2-0 Vicryl for the subcutaneous fat, 2-0 Monocryl for the deep dermal layer, and staples + Dermabond for  the skin.  Once the glue was fully dried, an Aquacell Ag dressing was applied.  The patient was then awakened from anesthesia and transported to the recovery room in stable condition.  Sponge, needle, and instrument counts were correct at the end of the case x2.  The patient tolerated the procedure well  and there were no known complications.  Please note that a surgical assistant was a medical necessity for this procedure to perform it in a safe and expeditious manner. Assistant was necessary to provide appropriate retraction of vital neurovascular structures, to prevent femoral fracture, and to allow for anatomic placement of the prosthesis.

## 2022-02-23 NOTE — Plan of Care (Signed)
  Problem: Education: Goal: Knowledge of General Education information will improve Description: Including pain rating scale, medication(s)/side effects and non-pharmacologic comfort measures Outcome: Progressing   Problem: Health Behavior/Discharge Planning: Goal: Ability to manage health-related needs will improve Outcome: Progressing   Problem: Clinical Measurements: Goal: Ability to maintain clinical measurements within normal limits will improve Outcome: Progressing Goal: Will remain free from infection Outcome: Progressing Goal: Diagnostic test results will improve Outcome: Progressing Goal: Respiratory complications will improve Outcome: Progressing Goal: Cardiovascular complication will be avoided Outcome: Progressing   Problem: Activity: Goal: Risk for activity intolerance will decrease Outcome: Progressing   Problem: Nutrition: Goal: Adequate nutrition will be maintained Outcome: Progressing   Problem: Coping: Goal: Level of anxiety will decrease Outcome: Progressing   Problem: Elimination: Goal: Will not experience complications related to bowel motility Outcome: Progressing Goal: Will not experience complications related to urinary retention Outcome: Progressing   Problem: Pain Managment: Goal: General experience of comfort will improve Outcome: Progressing   Problem: Safety: Goal: Ability to remain free from injury will improve Outcome: Progressing   Problem: Skin Integrity: Goal: Risk for impaired skin integrity will decrease Outcome: Progressing   Problem: Education: Goal: Ability to describe self-care measures that may prevent or decrease complications (Diabetes Survival Skills Education) will improve Outcome: Progressing Goal: Individualized Educational Video(s) Outcome: Progressing   Problem: Coping: Goal: Ability to adjust to condition or change in health will improve Outcome: Progressing   Problem: Fluid Volume: Goal: Ability to  maintain a balanced intake and output will improve Outcome: Progressing   Problem: Health Behavior/Discharge Planning: Goal: Ability to identify and utilize available resources and services will improve Outcome: Progressing Goal: Ability to manage health-related needs will improve Outcome: Progressing   Problem: Metabolic: Goal: Ability to maintain appropriate glucose levels will improve Outcome: Progressing   Problem: Nutritional: Goal: Maintenance of adequate nutrition will improve Outcome: Progressing Goal: Progress toward achieving an optimal weight will improve Outcome: Progressing   

## 2022-02-23 NOTE — Interval H&P Note (Signed)
History and Physical Interval Note:  02/23/2022 7:46 AM  Kristen Macias  has presented today for surgery, with the diagnosis of RIGHT FEMORAL NECK FRACTURE.  The various methods of treatment have been discussed with the patient and family. After consideration of risks, benefits and other options for treatment, the patient has consented to  Procedure(s): ARTHROPLASTY BIPOLAR HIP (HEMIARTHROPLASTY) (Right) as a surgical intervention.  The patient's history has been reviewed, patient examined, no change in status, stable for surgery.  I have reviewed the patient's chart and labs.  Questions were answered to the patient's satisfaction.     Hilton Cork Airon Sahni

## 2022-02-23 NOTE — Discharge Instructions (Signed)

## 2022-02-23 NOTE — Anesthesia Postprocedure Evaluation (Signed)
Anesthesia Post Note  Patient: [name]  Procedure(s) Performed: ARTHROPLASTY BIPOLAR HIP (HEMIARTHROPLASTY) (Right: Hip)     Patient location during evaluation: PACU Anesthesia Type: General Level of consciousness: awake and alert Pain management: pain level controlled Vital Signs Assessment: post-procedure vital signs reviewed and stable Respiratory status: spontaneous breathing, nonlabored ventilation, respiratory function stable and patient connected to nasal cannula oxygen Cardiovascular status: blood pressure returned to baseline and stable Postop Assessment: no apparent nausea or vomiting Anesthetic complications: no  No notable events documented.  Last Vitals:  Vitals:   02/23/22 0945 02/23/22 1000  BP: 112/76 103/67  Pulse: 88 90  Resp: 19 13  Temp:    SpO2: 100% 93%    Last Pain:  Vitals:   02/23/22 1000  TempSrc:   PainSc: 4                  [name]

## 2022-02-23 NOTE — Anesthesia Procedure Notes (Signed)
Procedure Name: Intubation Date/Time: 02/23/2022 7:56 AM  Performed by: Cleda Daub, CRNAPre-anesthesia Checklist: Patient identified, Emergency Drugs available, Suction available and Patient being monitored Patient Re-evaluated:Patient Re-evaluated prior to induction Oxygen Delivery Method: Circle system utilized Preoxygenation: Pre-oxygenation with 100% oxygen Induction Type: IV induction and Rapid sequence Laryngoscope Size: Mac and 3 Grade View: Grade I Tube type: Oral Tube size: 7.5 mm Number of attempts: 1 Airway Equipment and Method: Stylet and Oral airway Placement Confirmation: ETT inserted through vocal cords under direct vision, positive ETCO2 and breath sounds checked- equal and bilateral Secured at: 21 cm Tube secured with: Tape Dental Injury: Teeth and Oropharynx as per pre-operative assessment

## 2022-02-24 ENCOUNTER — Encounter (HOSPITAL_COMMUNITY): Payer: Self-pay | Admitting: Orthopedic Surgery

## 2022-02-24 DIAGNOSIS — S72001A Fracture of unspecified part of neck of right femur, initial encounter for closed fracture: Secondary | ICD-10-CM | POA: Diagnosis not present

## 2022-02-24 LAB — BASIC METABOLIC PANEL
Anion gap: 6 (ref 5–15)
BUN: 15 mg/dL (ref 8–23)
CO2: 23 mmol/L (ref 22–32)
Calcium: 7.8 mg/dL — ABNORMAL LOW (ref 8.9–10.3)
Chloride: 106 mmol/L (ref 98–111)
Creatinine, Ser: 1.08 mg/dL — ABNORMAL HIGH (ref 0.44–1.00)
GFR, Estimated: 50 mL/min — ABNORMAL LOW (ref >60)
Glucose, Bld: 121 mg/dL — ABNORMAL HIGH (ref 70–99)
Potassium: 4.2 mmol/L (ref 3.5–5.1)
Sodium: 135 mmol/L (ref 135–145)

## 2022-02-24 LAB — CBC
HCT: 25 % — ABNORMAL LOW (ref 36.0–46.0)
Hemoglobin: 7.9 g/dL — ABNORMAL LOW (ref 12.0–15.0)
MCH: 25.5 pg — ABNORMAL LOW (ref 26.0–34.0)
MCHC: 31.6 g/dL (ref 30.0–36.0)
MCV: 80.6 fL (ref 80.0–100.0)
Platelets: 149 10*3/uL — ABNORMAL LOW (ref 150–400)
RBC: 3.1 MIL/uL — ABNORMAL LOW (ref 3.87–5.11)
RDW: 15 % (ref 11.5–15.5)
WBC: 4.8 10*3/uL (ref 4.0–10.5)
nRBC: 0 % (ref 0.0–0.2)

## 2022-02-24 LAB — HEMOGLOBIN A1C
Hgb A1c MFr Bld: 6 % — ABNORMAL HIGH (ref 4.8–5.6)
Mean Plasma Glucose: 126 mg/dL

## 2022-02-24 LAB — GLUCOSE, CAPILLARY
Glucose-Capillary: 107 mg/dL — ABNORMAL HIGH (ref 70–99)
Glucose-Capillary: 126 mg/dL — ABNORMAL HIGH (ref 70–99)
Glucose-Capillary: 104 mg/dL — ABNORMAL HIGH (ref 70–99)
Glucose-Capillary: 134 mg/dL — ABNORMAL HIGH (ref 70–99)

## 2022-02-24 MED ORDER — ADULT MULTIVITAMIN W/MINERALS CH
1.0000 | ORAL_TABLET | Freq: Every day | ORAL | Status: DC
  Administered 2022-02-24 – 2022-02-26 (×3): 1 via ORAL
  Filled 2022-02-24 (×3): qty 1

## 2022-02-24 MED ORDER — ENSURE MAX PROTEIN PO LIQD
11.0000 [oz_av] | Freq: Every day | ORAL | Status: DC
  Administered 2022-02-25: 11 [oz_av] via ORAL
  Filled 2022-02-24 (×2): qty 330

## 2022-02-24 NOTE — Evaluation (Signed)
Physical Therapy Evaluation Patient Details Name: Kristen Macias MRN: [id number] DOB: 02-14-1934 Today's Date: 02/24/2022  History of Present Illness  Patient is a 89 year old female who presented after a fall on R side with R hip pain. Patient was found to have right acute impacted femoral neck fracture. Patient underwent R hip hemiarthroplasty through direct anterior approach on 1/1. PMH: a fib, bilateral cataracts, bladder CA, osteoporosis, COPD, T7 compression fx.  Clinical Impression  Pt admitted with above diagnosis.  Pt mod I with rollator at her baseline, very motivated to work with PT and progress to d/c home;  Amb 17' with RW and min/guard to supervision.  Pt currently with functional limitations due to the deficits listed below (see PT Problem List). Pt will benefit from skilled PT to increase their independence and safety with mobility to allow discharge to the venue listed below.          Recommendations for follow up therapy are one component of a multi-disciplinary discharge planning process, led by the attending physician.  Recommendations may be updated based on patient status, additional functional criteria and insurance authorization.  Follow Up Recommendations Home health PT      Assistance Recommended at Discharge Intermittent Supervision/Assistance  Patient can return home with the following  A little help with bathing/dressing/bathroom;Assistance with cooking/housework;Assist for transportation;Help with stairs or ramp for entrance    Equipment Recommendations Rolling walker (2 wheels)  Recommendations for Other Services       Functional Status Assessment Patient has had a recent decline in their functional status and demonstrates the ability to make significant improvements in function in a reasonable and predictable amount of time.     Precautions / Restrictions Precautions Precautions: Fall Restrictions Weight Bearing Restrictions: No RLE Weight Bearing:  Weight bearing as tolerated      Mobility  Bed Mobility               General bed mobility comments:  (OOB with OT)    Transfers Overall transfer level: Needs assistance Equipment used: Rolling walker (2 wheels) Transfers: Sit to/from Stand Sit to Stand: Min guard, Supervision           General transfer comment: cues for hand placement, safety on descent    Ambulation/Gait Ambulation/Gait assistance: Min guard Gait Distance (Feet): 90 Feet Assistive device: Rolling walker (2 wheels) Gait Pattern/deviations: Step-to pattern, Step-through pattern, Decreased stance time - right       General Gait Details: cues for RW distance from self and safety; min/guard for safety  Stairs            Wheelchair Mobility    Modified Rankin (Stroke Patients Only)       Balance Overall balance assessment: Mild deficits observed, not formally tested                                           Pertinent Vitals/Pain Pain Assessment Pain Assessment: Faces Faces Pain Scale: Hurts a little bit Pain Location: right hip Pain Descriptors / Indicators: Grimacing, Discomfort Pain Intervention(s): Limited activity within patient's tolerance, Monitored during session, Premedicated before session, Repositioned    Home Living Family/patient expects to be discharged to:: Private residence Living Arrangements: Alone Available Help at Discharge: Family;Available PRN/intermittently Type of Home: House Home Access: Stairs to enter Entrance Stairs-Rails: Right;Left;Can reach both Entrance Stairs-Number of Steps: 4  Home Equipment: Rollator (4 wheels);Wheelchair - manual;BSC/3in1;Grab bars - tub/shower Additional Comments: family to install longer on on opposite wall in shower. son coming to stay with pt    Prior Function Prior Level of Function : Independent/Modified Independent                     Hand Dominance        Extremity/Trunk  Assessment   Upper Extremity Assessment Upper Extremity Assessment: Overall WFL for tasks assessed    Lower Extremity Assessment Lower Extremity Assessment: RLE deficits/detail RLE Deficits / Details: grossly 3/5, A/AROM WFL    Cervical / Trunk Assessment Cervical / Trunk Assessment: Normal  Communication   Communication: No difficulties  Cognition Arousal/Alertness: Awake/alert Behavior During Therapy: WFL for tasks assessed/performed Overall Cognitive Status: Within Functional Limits for tasks assessed                                          General Comments      Exercises     Assessment/Plan    PT Assessment Patient needs continued PT services  PT Problem List Decreased strength;Decreased range of motion;Decreased activity tolerance;Decreased mobility       PT Treatment Interventions DME instruction;Therapeutic exercise;Gait training;Functional mobility training;Patient/family education;Therapeutic activities    PT Goals (Current goals can be found in the Care Plan section)  Acute Rehab PT Goals Patient Stated Goal: home today! PT Goal Formulation: With patient Time For Goal Achievement: 03/03/22 Potential to Achieve Goals: Good    Frequency Min 5X/week     Co-evaluation               AM-PAC PT "6 Clicks" Mobility  Outcome Measure Help needed turning from your back to your side while in a flat bed without using bedrails?: A Little Help needed moving from lying on your back to sitting on the side of a flat bed without using bedrails?: A Little Help needed moving to and from a bed to a chair (including a wheelchair)?: A Little Help needed standing up from a chair using your arms (e.g., wheelchair or bedside chair)?: A Little Help needed to walk in hospital room?: A Little Help needed climbing 3-5 steps with a railing? : A Little 6 Click Score: 18    End of Session Equipment Utilized During Treatment: Gait belt Activity Tolerance:  Patient tolerated treatment well Patient left: in chair;with call bell/phone within reach;with chair alarm set   PT Visit Diagnosis: Other abnormalities of gait and mobility (R26.89);Difficulty in walking, not elsewhere classified (R26.2)    Time: 0762-2633 PT Time Calculation (min) (ACUTE ONLY): 16 min   Charges:   PT Evaluation $PT Eval Low Complexity: [place], PT  Acute Rehab Dept [facility]) [phone]  [facility] Weekend Pager [facility] only)  [phone]  02/24/2022   [facility] 02/24/2022, 11:16 AM

## 2022-02-24 NOTE — Progress Notes (Signed)
PROGRESS NOTE    Kristen Macias  MGQ:[id number] DOB: 1933/05/24 DOA: 02/22/2022 PCP: Cipriano Mile, NP   Brief Narrative:  89 y.o. female with medical history significant of aortic atherosclerosis, chronic atrial fibrillation not on anticoagulation, biatrial enlargement, bilateral cataracts, lower extremity edema, bladder cancer, cholelithiasis, T7 compression fracture, COPD, diverticulosis, GERD, history of DVT, hiatal hernia, hyperlipidemia, iron deficiency anemia, pyelonephritis, osteoarthritis, class I obesity, osteoporosis, history of pneumonia, pulmonary nodule, restless leg syndrome, rotator cuff tear, tricuspid regurgitation, urinary incontinence and emergency who had a mechanical fall home injuring her right hip with inability to bear weight on the RLE.  She denied any prodromal symptoms before her fall.  No fever, chills or night sweats. No sore throat, rhinorrhea, dyspnea, wheezing or hemoptysis.  No chest pain, palpitations, diaphoresis, PND, orthopnea or recent pitting edema of the lower extremities.  No appetite changes, abdominal pain, diarrhea, melena or hematochezia.  She gets occasionally constipated.  No flank pain, dysuria, frequency or hematuria.  No polyuria, polydipsia, polyphagia or blurred vision.   ED course: Initial vital signs were temperature  97.7 F, pulse 91, respiration 20, BP 154/94 mmHg O2 sat 92%.  The patient received hydromorphone 1 mg IVP and ondansetron 4 mg IVP in the ED.   Lab work: CBC showed a white count of 5.4, hemoglobin 10.4 g/dL platelets 183.  Normal PT and INR.  BMP showed a calcium of 8.7, glucose of 119, BUN 17 and creatinine 1.08 mg/dL.  The rest of the electrolytes are normal after sodium correction.   Imaging: Right hip fracture with an acute, impacted right femoral neck fracture.  Portable 1 view chest radiograph with mildly increased pulmonary interstitial and likely due to vascular congestion or due to being a supine radiograph.  Chronic moderate  gastric hiatal hernia.  No other acute findings  Assessment & Plan:   Principal Problem:   Closed right hip fracture, initial encounter ([place]) Active Problems:   GERD (gastroesophageal reflux disease)   Prediabetes   Hyperlipidemia   Atrial fibrillation (HCC)   COPD (chronic obstructive pulmonary disease) (HCC)   Right hip fracture s/p right hip hemiarthroplasty- Pt eval pending Pain control Bowel regime Dvt prophylaxis per ortho Eliquis  Gerd continue protonix   Afib rate controlled on beta blocker   Hyperlipedemia on lipitor   Copd stable Nebs  Dm on metformin CBG (last 3)  Recent Labs    02/23/22 1729 02/23/22 2209 02/24/22 0739  GLUCAP 144* 161* 107*     Anemia -hemoglobin slightly down to 7.9 trending down likely combination of IV fluids as well as recent surgery.  Monitor in a.m.  Mild hyponatremia resolved     Estimated body mass index is 34.17 kg/m as calculated from the following:   Height as of this encounter: '5\' 3"'$  (1.6 m).   Weight as of this encounter: 87.5 kg.  DVT prophylaxis: scd Code Status: full Family Communication: None at bedside today Disposition Plan:  Status is: Inpatient Remains inpatient appropriate because: hip fracture   Consultants: ortho  Procedures: right hip hemiarthroplasty Antimicrobials:none  Subjective: Patient is resting in bed but she is able to be woken up easily.  She denies any heartburn nausea vomiting abdominal pain or hip pain.  Objective: Vitals:   02/23/22 1811 02/23/22 2207 02/24/22 0201 02/24/22 1027  BP: 121/73 111/79 112/72 106/72  Pulse: 80 96 83 84  Resp: '16 16 16 18  '$ Temp: 98.5 F (36.9 C) 97.9 F (36.6 C) (!) 97.5 F (36.4 C) 97.8 F (  36.6 C)  TempSrc: Oral Oral Oral   SpO2: 97% 98% 100% 94%  Weight:      Height:        Intake/Output Summary (Last 24 hours) at 02/24/2022 1048 Last data filed at 02/24/2022 0835 Gross per 24 hour  Intake 2491.52 ml  Output 1600 ml  Net 891.52 ml     Filed Weights   02/22/22 0223 02/23/22 0750  Weight: 87.5 kg 87.5 kg    Examination:  General exam: Appears in nad  Respiratory system: Clear to auscultation. Respiratory effort normal. Cardiovascular system: S1 & S2 heard, RRR. No JVD, murmurs, rubs, gallops or clicks. No pedal edema. Gastrointestinal system: Abdomen is nondistended, soft and nontender. No organomegaly or masses felt. Normal bowel sounds heard. Central nervous system: Alert and oriented. No focal neurological deficits. Extremities: Incisions on the right hip covered with dressing which appears clean dry and intact.    Data Reviewed: I have personally reviewed following labs and imaging studies  CBC: Recent Labs  Lab 02/22/22 0413 02/23/22 0349 02/24/22 0335  WBC 5.4 3.8* 4.8  NEUTROABS 4.5  --   --   HGB 10.4* 9.7* 7.9*  HCT 33.7* 31.1* 25.0*  MCV 81.4 82.3 80.6  PLT 183 169 149*    Basic Metabolic Panel: Recent Labs  Lab 02/22/22 0413 02/23/22 0349 02/24/22 0335  NA 134* 131* 135  K 4.1 4.3 4.2  CL 102 99 106  CO2 '25 24 23  '$ GLUCOSE 118* 101* 121*  BUN '17 15 15  '$ CREATININE 1.08* 1.07* 1.08*  CALCIUM 8.7* 8.2* 7.8*    GFR: Estimated Creatinine Clearance: 39.2 mL/min (A) (by C-G formula based on SCr of 1.08 mg/dL (H)). Liver Function Tests: Recent Labs  Lab 02/23/22 0349  AST 26  ALT 18  ALKPHOS 75  BILITOT 0.4  PROT 6.3*  ALBUMIN 3.2*    No results for input(s): "LIPASE", "AMYLASE" in the last 168 hours. No results for input(s): "AMMONIA" in the last 168 hours. Coagulation Profile: Recent Labs  Lab 02/22/22 0413  INR 1.0    Cardiac Enzymes: No results for input(s): "CKTOTAL", "CKMB", "CKMBINDEX", "TROPONINI" in the last 168 hours. BNP (last 3 results) No results for input(s): "PROBNP" in the last 8760 hours. HbA1C: Recent Labs    02/23/22 0350  HGBA1C 6.0*   CBG: Recent Labs  Lab 02/22/22 2239 02/23/22 1212 02/23/22 1729 02/23/22 2209 02/24/22 0739  GLUCAP  105* 128* 144* 161* 107*    Lipid Profile: No results for input(s): "CHOL", "HDL", "LDLCALC", "TRIG", "CHOLHDL", "LDLDIRECT" in the last 72 hours. Thyroid Function Tests: No results for input(s): "TSH", "T4TOTAL", "FREET4", "T3FREE", "THYROIDAB" in the last 72 hours. Anemia Panel: No results for input(s): "VITAMINB12", "FOLATE", "FERRITIN", "TIBC", "IRON", "RETICCTPCT" in the last 72 hours. Sepsis Labs: No results for input(s): "PROCALCITON", "LATICACIDVEN" in the last 168 hours.  Recent Results (from the past 240 hour(s))  Surgical PCR screen     Status: Abnormal   Collection Time: 02/22/22  8:52 AM   Specimen: Nasal Mucosa; Nasal Swab  Result Value Ref Range Status   MRSA, PCR NEGATIVE NEGATIVE Final   Staphylococcus aureus POSITIVE (A) NEGATIVE Final    Comment: (NOTE) The Xpert SA Assay (FDA approved for NASAL specimens in patients 85 years of age and older), is one component of a comprehensive surveillance program. It is not intended to diagnose infection nor to guide or monitor treatment. Performed at [facility], [place] [address] [place], [place] [address]  Radiology Studies: DG HIP UNILAT WITH PELVIS 1V RIGHT  Result Date: 02/23/2022 CLINICAL DATA:  89 year old female status post right hip arthroplasty. EXAM: DG HIP (WITH OR WITHOUT PELVIS) 1V RIGHT; DG C-ARM 1-60 MIN-NO REPORT COMPARISON:  02/22/2022. FINDINGS: Multiple intraoperative views of the bony pelvis and right hip demonstrate interval performance of a right hip hemiarthroplasty. The prosthesis appears well seated without definite periprosthetic fracture or other immediate complicating features. Two views provided show the prosthetic femoral head projecting within the right acetabulum. IMPRESSION: 1. Intraoperative views from right hip hemiarthroplasty, as above, with no immediate complicating features. Electronically Signed   By: Vinnie Langton M.D.   On: 02/23/2022 10:33   DG Pelvis  Portable  Result Date: 02/23/2022 CLINICAL DATA:  89 year old female status post right hip arthroplasty. EXAM: PORTABLE PELVIS 1-2 VIEWS COMPARISON:  Intraoperative films 02/23/2022. FINDINGS: Postoperative changes of right hip hemiarthroplasty are noted. Prosthetic femoral head projects over the region of the right acetabulum on this single view examination. Small amount of gas in the joint space extensive gas in the overlying soft tissues. Skin staples lateral to the right proximal femur. Bony pelvis and left proximal femur are unremarkable in appearance. IMPRESSION: 1. Expected postoperative findings of recent right hip hemiarthroplasty, as above, without immediate complicating features. Electronically Signed   By: Vinnie Langton M.D.   On: 02/23/2022 10:31   DG C-Arm 1-60 Min-No Report  Result Date: 02/23/2022 Fluoroscopy was utilized by the requesting physician.  No radiographic interpretation.        Scheduled Meds:  apixaban  2.5 mg Oral Q12H   atorvastatin  20 mg Oral QHS   docusate sodium  100 mg Oral BID   gabapentin  100 mg Oral BID   metoprolol succinate  50 mg Oral Daily   mupirocin ointment  1 Application Nasal BID   pantoprazole  40 mg Oral Daily   polyethylene glycol  17 g Oral Daily   senna  1 tablet Oral BID   Continuous Infusions:  sodium chloride 100 mL/hr at 02/24/22 0825   methocarbamol (ROBAXIN) IV       LOS: 2 days    Time spent: 38 min  Georgette Shell, MD 02/24/2022, 10:48 AM

## 2022-02-24 NOTE — Progress Notes (Signed)
Initial Nutrition Assessment  INTERVENTION:   -Ensure MAX Protein po daily, each supplement provides 150 kcal and 30 grams of protein   -Multivitamin with minerals daily  NUTRITION DIAGNOSIS:   Increased nutrient needs related to hip fracture, post-op healing as evidenced by estimated needs.  GOAL:   Patient will meet greater than or equal to 90% of their needs  MONITOR:   PO intake, Supplement acceptance, Weight trends, Labs, I & O's  REASON FOR ASSESSMENT:   Consult Hip fracture protocol  ASSESSMENT:   89 y.o. female with medical history significant of aortic atherosclerosis, chronic atrial fibrillation not on anticoagulation, biatrial enlargement, bilateral cataracts, lower extremity edema, bladder cancer, cholelithiasis, T7 compression fracture, COPD, diverticulosis, GERD, history of DVT, hiatal hernia, hyperlipidemia, iron deficiency anemia, pyelonephritis, osteoarthritis, class I obesity, osteoporosis, history of pneumonia, pulmonary nodule, restless leg syndrome, rotator cuff tear, tricuspid regurgitation, urinary incontinence and emergency who had a mechanical fall home injuring her right hip with inability to bear weight on the RLE.   Admitted for right hip fracture.  1/1 s/p : Right hip hemiarthroplasty, anterior approach   Patient sitting in chair, eating lunch. Reports she isn't eating as well as she doesn't have her dentures with her so chewing is more difficult. Pt drinking chocolate milk as well. Pt states she eats 2 meals at home and cooks her meals. For lunch she will have breakfast foods and for dinner she tends to make chicken tenders with beans and corn or sometimes orders pizza. Has a good appetite. Will order daily Ensure Max for additional protein and daily MVI to aid in post-op healing.  Per weight records, no weight loss noted.  Medications: Colace, Miralax, Senokot  Labs reviewed: CBGs: 92-144   NUTRITION - FOCUSED PHYSICAL EXAM:  Flowsheet Row  Most Recent Value  Orbital Region No depletion  Upper Arm Region Mild depletion  Thoracic and Lumbar Region No depletion  Buccal Region Mild depletion  Temple Region Mild depletion  Clavicle Bone Region No depletion  Clavicle and Acromion Bone Region No depletion  Scapular Bone Region No depletion  Dorsal Hand No depletion  Patellar Region Unable to assess  Anterior Thigh Region Unable to assess  Posterior Calf Region Unable to assess  Edema (RD Assessment) None  Hair Reviewed  Eyes Reviewed  Mouth Reviewed  [missing teeth, dentures at home]  Skin Reviewed  Nails Reviewed       Diet Order:   Diet Order             Diet Carb Modified Fluid consistency: Thin; Room service appropriate? Yes  Diet effective now                   EDUCATION NEEDS:   No education needs have been identified at this time  Skin:  Skin Assessment: Skin Integrity Issues: Skin Integrity Issues:: Incisions Incisions: 1/1 right hip  Last BM:  PTA  Height:   Ht Readings from Last 1 Encounters:  02/23/22 '5\' 3"'$  (1.6 m)    Weight:   Wt Readings from Last 1 Encounters:  02/23/22 87.5 kg    BMI:  Body mass index is 34.17 kg/m.  Estimated Nutritional Needs:   Kcal:  1400-1600  Protein:  65-75g  Fluid:  1.5L/day   Clayton Bibles, MS, RD, LDN Inpatient Clinical Dietitian Contact information available via Amion

## 2022-02-24 NOTE — Progress Notes (Signed)
    Subjective:  Patient reports pain as mild to none.  Denies N/V/CP/SOB/Abd pain. She denies any tingling or numbness in her LE bilaterally. She reports she has been having strange dreams.   Objective:   VITALS:   Vitals:   02/23/22 1229 02/23/22 1811 02/23/22 2207 02/24/22 0201  BP: 103/71 121/73 111/79 112/72  Pulse: 83 80 96 83  Resp: '16 16 16 16  '$ Temp: 97.9 F (36.6 C) 98.5 F (36.9 C) 97.9 F (36.6 C) (!) 97.5 F (36.4 C)  TempSrc: Oral Oral Oral Oral  SpO2: 99% 97% 98% 100%  Weight:      Height:        Patient sleeping, woken up for exam. NAD.  ABD soft Neurovascular intact Sensation intact distally Intact pulses distally Dorsiflexion/Plantar flexion intact Incision: dressing C/D/I No cellulitis present Compartment soft Painless log rolling of the hip.   Lab Results  Component Value Date   WBC 4.8 02/24/2022   HGB 7.9 (L) 02/24/2022   HCT 25.0 (L) 02/24/2022   MCV 80.6 02/24/2022   PLT 149 (L) 02/24/2022   BMET    Component Value Date/Time   NA 135 02/24/2022 0335   NA 140 08/20/2016 1650   K 4.2 02/24/2022 0335   CL 106 02/24/2022 0335   CO2 23 02/24/2022 0335   GLUCOSE 121 (H) 02/24/2022 0335   BUN 15 02/24/2022 0335   BUN 18 08/20/2016 1650   CREATININE 1.08 (H) 02/24/2022 0335   CREATININE 1.03 06/14/2017 1039   CREATININE 1.15 (H) 04/30/2016 1023   CALCIUM 7.8 (L) 02/24/2022 0335   GFRNONAA 50 (L) 02/24/2022 0335   GFRNONAA 50 (L) 06/14/2017 1039   GFRNONAA 45 (L) 04/30/2016 1023     Assessment/Plan: 1 Day Post-Op   Principal Problem:   Closed right hip fracture, initial encounter ([place]) Active Problems:   GERD (gastroesophageal reflux disease)   Prediabetes   Hyperlipidemia   Atrial fibrillation (HCC)   COPD (chronic obstructive pulmonary disease) ([place])   WBAT with walker DVT ppx:  Eliquis , SCDs, TEDS PO pain control PT/OT: PT has not seen yet. PT to come by today.  Dispo: Patient under care of the medical team,  disposition per their recommendation.    Charlott Rakes, PA-C 02/24/2022, 7:55 AM   [facility]  [place] [address], [address], [place], [place] [address] Phone: [phone] [email or web address] Facebook  [employer]

## 2022-02-24 NOTE — Progress Notes (Signed)
Physical Therapy Treatment Patient Details Name: Kristen Macias MRN: [id number] DOB: 1933-09-26 Today's Date: 02/24/2022   History of Present Illness Patient is a 89 year old female who presented after a fall on R side with R hip pain. Patient was found to have right acute impacted femoral neck fracture. Patient underwent R hip hemiarthroplasty through direct anterior approach on 1/1. PMH: a fib, bilateral cataracts, bladder CA, osteoporosis, COPD, T7 compression fx.    PT Comments    Pt having some pain this pm, just getting her pain meds; agreeable to  reviewing exercises in bed. Discussed d/c home tomorrow if cleared by MD, will see in am to review stairs and reinforce safety with mobility.  Recommendations for follow up therapy are one component of a multi-disciplinary discharge planning process, led by the attending physician.  Recommendations may be updated based on patient status, additional functional criteria and insurance authorization.  Follow Up Recommendations  Home health PT     Assistance Recommended at Discharge Intermittent Supervision/Assistance  Patient can return home with the following A little help with bathing/dressing/bathroom;Assistance with cooking/housework;Assist for transportation;Help with stairs or ramp for entrance   Equipment Recommendations  Rolling walker (2 wheels)    Recommendations for Other Services       Precautions / Restrictions Precautions Precautions: Fall Restrictions Weight Bearing Restrictions: No RLE Weight Bearing: Weight bearing as tolerated     Mobility  Bed Mobility               General bed mobility comments:  (OOB with OT)    Transfers Overall transfer level: Needs assistance Equipment used: Rolling walker (2 wheels) Transfers: Sit to/from Stand Sit to Stand: Min guard, Supervision           General transfer comment: cues for hand placement, safety on descent    Ambulation/Gait Ambulation/Gait assistance:  Min guard Gait Distance (Feet): 90 Feet Assistive device: Rolling walker (2 wheels) Gait Pattern/deviations: Step-to pattern, Step-through pattern, Decreased stance time - right       General Gait Details: cues for RW distance from self and safety; min/guard for safety   Stairs             Wheelchair Mobility    Modified Rankin (Stroke Patients Only)       Balance Overall balance assessment: Mild deficits observed, not formally tested                                          Cognition Arousal/Alertness: Awake/alert Behavior During Therapy: WFL for tasks assessed/performed Overall Cognitive Status: Within Functional Limits for tasks assessed                                          Exercises General Exercises - Lower Extremity Ankle Circles/Pumps: AROM, Both, 15 reps Quad Sets: AROM, Both, 10 reps Gluteal Sets: AROM, Both, 10 reps Heel Slides: AROM, AAROM, Both, 10 reps Hip ABduction/ADduction: AAROM, Left, 10 reps    General Comments        Pertinent Vitals/Pain Pain Assessment Pain Assessment: Faces Faces Pain Scale: Hurts little more Pain Location: right hip Pain Descriptors / Indicators: Grimacing, Discomfort Pain Intervention(s): Limited activity within patient's tolerance, Monitored during session, Premedicated before session, Repositioned    Home Living  Prior Function            PT Goals (current goals can now be found in the care plan section) Acute Rehab PT Goals Patient Stated Goal: home today PT Goal Formulation: With patient Time For Goal Achievement: 03/03/22 Potential to Achieve Goals: Good Progress towards PT goals: Progressing toward goals    Frequency    Min 5X/week      PT Plan Current plan remains appropriate    Co-evaluation              AM-PAC PT "6 Clicks" Mobility   Outcome Measure  Help needed turning from your back to your side while  in a flat bed without using bedrails?: A Little Help needed moving from lying on your back to sitting on the side of a flat bed without using bedrails?: A Little Help needed moving to and from a bed to a chair (including a wheelchair)?: A Little Help needed standing up from a chair using your arms (e.g., wheelchair or bedside chair)?: A Little Help needed to walk in hospital room?: A Little Help needed climbing 3-5 steps with a railing? : A Little 6 Click Score: 18    End of Session Equipment Utilized During Treatment: Gait belt Activity Tolerance: Patient tolerated treatment well Patient left: in bed;with call bell/phone within reach;with bed alarm set Nurse Communication: Mobility status PT Visit Diagnosis: Other abnormalities of gait and mobility (R26.89);Difficulty in walking, not elsewhere classified (R26.2)     Time: 4287-6811 PT Time Calculation (min) (ACUTE ONLY): 14 min  Charges:  $Therapeutic Exercise: 8-22 mins                     Baxter Flattery, PT  Acute Rehab Dept [facility]) [phone]  [facility] Weekend Pager [facility] only)  [phone]  02/24/2022    [facility] 02/24/2022, 3:09 PM

## 2022-02-24 NOTE — Evaluation (Signed)
Occupational Therapy Evaluation Patient Details Name: Kristen Macias MRN: [id number] DOB: 1933-06-29 Today's Date: 02/24/2022   History of Present Illness Patient is a 89 year old female who presented after a fall on R side with R hip pain. Patient was found to have right acute impacted femoral neck fracture. Patient underwent R hip hemiarthroplasty through direct anterior approach on 1/1. PMH: a fib, bilateral cataracts, bladder CA, osteoporosis, COPD, T7 compression fx.   Clinical Impression   Patient is a 89 year old female who was admitted for above. Patient was living at home alone with family support for driving prior level. Currently, patient is min A for transfers with RW, max A for LB dressing tasks and noted to have increased pain with movement impacting participation in ADLs. Patient would continue to benefit from skilled OT services at this time while admitted and after d/c to address noted deficits in order to improve overall safety and independence in ADLs. Patient reported plan was to transition home with family support. Son is planning to move in with patient but will be at work during the day.        Recommendations for follow up therapy are one component of a multi-disciplinary discharge planning process, led by the attending physician.  Recommendations may be updated based on patient status, additional functional criteria and insurance authorization.   Follow Up Recommendations  Home health OT     Assistance Recommended at Discharge Frequent or constant Supervision/Assistance  Patient can return home with the following A little help with walking and/or transfers;A lot of help with bathing/dressing/bathroom;Assistance with cooking/housework;Direct supervision/assist for medications management;Assist for transportation;Help with stairs or ramp for entrance;Direct supervision/assist for financial management    Functional Status Assessment  Patient has had a recent decline in  their functional status and demonstrates the ability to make significant improvements in function in a reasonable and predictable amount of time.  Equipment Recommendations  Other (comment) (RW)       Precautions / Restrictions Restrictions Weight Bearing Restrictions: No RLE Weight Bearing: Weight bearing as tolerated      Mobility Bed Mobility Overal bed mobility: Needs Assistance Bed Mobility: Supine to Sit     Supine to sit: Min assist     General bed mobility comments: with increased time to advance BLE to EOB      Balance Overall balance assessment: Mild deficits observed, not formally tested                                         ADL either performed or assessed with clinical judgement   ADL Overall ADL's : Needs assistance/impaired Eating/Feeding: Modified independent;Sitting   Grooming: Set up;Sitting   Upper Body Bathing: Set up;Sitting   Lower Body Bathing: Moderate assistance;Sit to/from stand;Sitting/lateral leans   Upper Body Dressing : Set up;Sitting   Lower Body Dressing: Moderate assistance;Maximal assistance;Sitting/lateral leans;Sit to/from stand Lower Body Dressing Details (indicate cue type and reason): to don socks sitting EOB Toilet Transfer: Minimal assistance;Ambulation;Rolling walker (2 wheels) Toilet Transfer Details (indicate cue type and reason): cues to push through [place] and Hygiene: Minimal assistance;Sit to/from stand       Functional mobility during ADLs: Minimal assistance;Rolling walker (2 wheels)       Vision Patient Visual Report: No change from baseline Additional Comments: reported wearing reading glasses at home. not present here     Perception  Praxis      Pertinent Vitals/Pain Pain Assessment Pain Assessment: Faces Faces Pain Scale: Hurts little more Pain Location: RLE Pain Descriptors / Indicators: Grimacing, Discomfort Pain Intervention(s): Limited  activity within patient's tolerance, Premedicated before session, Monitored during session        Extremity/Trunk Assessment Upper Extremity Assessment Upper Extremity Assessment: Overall WFL for tasks assessed   Lower Extremity Assessment Lower Extremity Assessment: Defer to PT evaluation   Cervical / Trunk Assessment Cervical / Trunk Assessment: Normal   Communication Communication Communication: No difficulties   Cognition Arousal/Alertness: Awake/alert Behavior During Therapy: WFL for tasks assessed/performed Overall Cognitive Status: Within Functional Limits for tasks assessed                        Home Living Family/patient expects to be discharged to:: Private residence Living Arrangements: Alone Available Help at Discharge: Family;Available PRN/intermittently (son moving in but works during the day) Type of Home: House Home Access: Stairs to enter [employer] of Steps: 4         Bathroom Shower/Tub: Aeronautical engineer: Rollator (4 wheels);Wheelchair - manual;BSC/3in1;Grab bars - tub/shower   Additional Comments: family to install longer on on opposite wall in shower.      Prior Functioning/Environment Prior Level of Function : Independent/Modified Independent                        OT Problem List: Decreased activity tolerance;Decreased strength;Impaired balance (sitting and/or standing);Decreased coordination;Decreased safety awareness;Decreased knowledge of use of DME or AE;Decreased knowledge of precautions;Pain      OT Treatment/Interventions: Self-care/ADL training;Energy conservation;Therapeutic exercise;DME and/or AE instruction;Therapeutic activities;Patient/family education;Balance training    OT Goals(Current goals can be found in the care plan section) Acute Rehab OT Goals Patient Stated Goal: to go home OT Goal Formulation: With patient Time For Goal Achievement: 03/10/22 Potential to Achieve  Goals: Fair  OT Frequency: Min 2X/week    Co-evaluation              AM-PAC OT "6 Clicks" Daily Activity     Outcome Measure Help from another person eating meals?: None Help from another person taking care of personal grooming?: A Little Help from another person toileting, which includes using toliet, bedpan, or urinal?: A Little Help from another person bathing (including washing, rinsing, drying)?: A Lot Help from another person to put on and taking off regular upper body clothing?: A Little Help from another person to put on and taking off regular lower body clothing?: A Lot 6 Click Score: 17   End of Session Equipment Utilized During Treatment: Gait belt;Rolling walker (2 wheels) Nurse Communication: Other (comment) (ok to participate)  Activity Tolerance: Patient tolerated treatment well Patient left: in chair;with call bell/phone within reach;with chair alarm set  OT Visit Diagnosis: Unsteadiness on feet (R26.81);Other abnormalities of gait and mobility (R26.89);Muscle weakness (generalized) (M62.81);Pain Pain - Right/Left: Right Pain - part of body: Hip                Time: 5697-9480 OT Time Calculation (min): 22 min Charges:  OT General Charges $OT Visit: 1 Visit OT Evaluation $OT Eval Moderate Complexity: 1 Mod  Harpreet Signore OTR/L, MS Acute Rehabilitation Department Office# [phone]   Willa Rough 02/24/2022, 10:51 AM

## 2022-02-24 NOTE — TOC Initial Note (Addendum)
Transition of Care [facility]) - Initial/Assessment Note   Patient Details  Name: Kristen Macias MRN: [id number] Date of Birth: September 06, 1933  Transition of Care [facility]) CM/SW Contact:    Sherie Don, LCSW Phone Number: 02/24/2022, 4:10 PM  Clinical Narrative: PT evaluation recommended HHPT/OT and a rolling walker. Patient is agreeable to referrals for both. CSW made [facility] referral to [facility], which was declined. CSW awaiting response from [place].  AddendumLatricia Heft declined [place] referral.  Expected Discharge Plan: [place] Barriers to Discharge: Continued Medical Work up  Patient Goals and CMS Choice Patient states their goals for this hospitalization and ongoing recovery are:: Discharge home with [facility] CMS Medicare.gov Compare Post Acute Care list provided to:: Patient Choice offered to / list presented to : Patient  Expected Discharge Plan and Services In-house Referral: Clinical Social Work Post Acute Care Choice: [place] arrangements for the past 2 months: Mobile Home  Prior Living Arrangements/Services Living arrangements for the past 2 months: Mobile Home Patient language and need for interpreter reviewed:: Yes Do you feel safe going back to the place where you live?: Yes      Need for Family Participation in Patient Care: No (Comment) Care giver support system in place?: Yes (comment) Criminal Activity/Legal Involvement Pertinent to Current Situation/Hospitalization: No - Comment as needed  Activities of Daily Living Home Assistive Devices/Equipment: Shower chair with back ADL Screening (condition at time of admission) Patient's cognitive ability adequate to safely complete daily activities?: Yes Is the patient deaf or have difficulty hearing?: No Does the patient have difficulty seeing, even when wearing glasses/contacts?: No Does the patient have difficulty concentrating, remembering, or making decisions?: No Patient able to express need for assistance  with ADLs?: Yes Does the patient have difficulty dressing or bathing?: Yes Independently performs ADLs?: No Communication: Independent Dressing (OT): Needs assistance Is this a change from baseline?: Change from baseline, expected to last >3 days Grooming: Needs assistance Is this a change from baseline?: Change from baseline, expected to last >3 days Feeding: Independent Bathing: Needs assistance Is this a change from baseline?: Change from baseline, expected to last >3 days Toileting: Needs assistance Is this a change from baseline?: Change from baseline, expected to last >3days In/Out Bed: Needs assistance Is this a change from baseline?: Change from baseline, expected to last >3 days Walks in Home: Needs assistance Is this a change from baseline?: Change from baseline, expected to last >3 days Does the patient have difficulty walking or climbing stairs?: No Weakness of Legs: Both Weakness of Arms/Hands: None  Permission Sought/Granted Permission sought to share information with : Other (comment) Permission granted to share information with : Yes, Verbal Permission Granted Permission granted to share info w AGENCY: [place] agencies, DME company  Emotional Assessment Appearance:: Appears stated age Attitude/Demeanor/Rapport: Engaged Affect (typically observed): Accepting Orientation: : Oriented to Self, Oriented to Place, Oriented to  Time, Oriented to Situation Alcohol / Substance Use: Not Applicable Psych Involvement: No (comment)  Admission diagnosis:  Closed right hip fracture, initial encounter [facility] [S72.001A] Patient Active Problem List   Diagnosis Date Noted   Closed right hip fracture, initial encounter ([place]) 02/22/2022   COPD (chronic obstructive pulmonary disease) ([place]) 02/22/2022   Symptomatic anemia 03/15/2017   Bladder cancer ([place])    Acute cystitis with hematuria 12/25/2016   Anticoagulated 12/11/2016   Atrial fibrillation ([place]) 05/19/2016   Osteoporosis  03/08/2015   Compression fracture of T7 thoracic vertebra 03/07/2015   Hyperlipidemia 08/14/2014  Cataracts, bilateral 08/02/2014   Prediabetes 05/27/2013   DVT (deep venous thrombosis) ([place]) 05/18/2013   Dysphagia 01/15/2012   History of tobacco use 08/03/2011   GERD (gastroesophageal reflux disease) 08/03/2011   Goals of care, counseling/discussion 08/03/2011   COPD exacerbation ([place]) 08/03/2011   PCP:  Cipriano Mile, NP Pharmacy:   [id number] [place] [place] [address]Phone: [phone]: [phone] Social Determinants of Health (SDOH) Social History: SDOH Screenings   Food Insecurity: Unknown (02/22/2022)  Housing: Low Risk  (02/22/2022)  Transportation Needs: Unknown (02/22/2022)  Utilities: Unknown (02/22/2022)  Depression (PHQ2-9): Low Risk  (01/30/2020)  Tobacco Use: Medium Risk (02/23/2022)   SDOH Interventions:    Readmission Risk Interventions     No data to display

## 2022-02-25 ENCOUNTER — Other Ambulatory Visit: Payer: Medicare Other

## 2022-02-25 DIAGNOSIS — S72001A Fracture of unspecified part of neck of right femur, initial encounter for closed fracture: Secondary | ICD-10-CM | POA: Diagnosis not present

## 2022-02-25 LAB — CBC
HCT: 24 % — ABNORMAL LOW (ref 36.0–46.0)
Hemoglobin: 7.5 g/dL — ABNORMAL LOW (ref 12.0–15.0)
MCH: 25.4 pg — ABNORMAL LOW (ref 26.0–34.0)
MCHC: 31.3 g/dL (ref 30.0–36.0)
MCV: 81.4 fL (ref 80.0–100.0)
Platelets: 157 10*3/uL (ref 150–400)
RBC: 2.95 MIL/uL — ABNORMAL LOW (ref 3.87–5.11)
RDW: 15.2 % (ref 11.5–15.5)
WBC: 4.4 10*3/uL (ref 4.0–10.5)
nRBC: 0 % (ref 0.0–0.2)

## 2022-02-25 LAB — GLUCOSE, CAPILLARY
Glucose-Capillary: 101 mg/dL — ABNORMAL HIGH (ref 70–99)
Glucose-Capillary: 148 mg/dL — ABNORMAL HIGH (ref 70–99)
Glucose-Capillary: 119 mg/dL — ABNORMAL HIGH (ref 70–99)
Glucose-Capillary: 116 mg/dL — ABNORMAL HIGH (ref 70–99)

## 2022-02-25 MED ORDER — APIXABAN 2.5 MG PO TABS: 2.5000 mg | ORAL_TABLET | Freq: Two times a day (BID) | ORAL | 0 refills | Status: AC

## 2022-02-25 MED ORDER — HYDROCODONE-ACETAMINOPHEN 10-325 MG PO TABS: 0.5000 | ORAL_TABLET | ORAL | 0 refills | Status: AC | PRN

## 2022-02-25 MED ORDER — GUAIFENESIN-DM 100-10 MG/5ML PO SYRP
10.0000 mL | ORAL_SOLUTION | ORAL | Status: DC | PRN
  Administered 2022-02-25 – 2022-02-26 (×3): 10 mL via ORAL
  Filled 2022-02-25 (×3): qty 10

## 2022-02-25 MED ORDER — METHOCARBAMOL 500 MG PO TABS: 500.0000 mg | ORAL_TABLET | Freq: Four times a day (QID) | ORAL | 0 refills | Status: AC | PRN

## 2022-02-25 MED ORDER — SENNA 8.6 MG PO TABS: 1.0000 | ORAL_TABLET | Freq: Two times a day (BID) | ORAL | 0 refills | Status: AC

## 2022-02-25 MED ORDER — METOPROLOL SUCCINATE ER 50 MG PO TB24: 50.0000 mg | ORAL_TABLET | Freq: Every day | ORAL | 0 refills | Status: AC

## 2022-02-25 MED ORDER — HYDROCODONE-ACETAMINOPHEN 10-325 MG PO TABS: 0.5000 | ORAL_TABLET | ORAL | 0 refills | Status: DC | PRN

## 2022-02-25 NOTE — Discharge Summary (Signed)
Physician Discharge Summary  Kristen Macias KWI:[id number] DOB: 27-Oct-1933 DOA: 02/22/2022  PCP: Cipriano Mile, NP  Admit date: 02/22/2022 Discharge date: 02/25/2022  Time spent: 37 minutes  Recommendations for Outpatient Follow-up:  Needs Chem-12 CBC in about 3 to 4 weeks Requires outpatient PT as well as rolling walker and should complete Eliquis twice daily in about a month or as per orthopedics--she should be followed up by her cardiologist Dr. 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 Lyla Glassing and procedure performed  Hospital Course:  Acute right hip fracture -Status post bipolar arthroplasty 02/23/2022 - Placed on Eliquis 2.5 twice daily and consideration as an outpatient for continuation of the same unclear why she is not on it - Pain control Tylenol Norco and Robaxin-can continue home gabapentin  Chronic atrial fibrillation -Seen last 11/2016 by [facility] -On metoprolol XL but not on anticoagulation, needs outpatient discussion about resumption  Reflux - Continue meds  Stable COPD - Continue inhalers as needed  DM TY 2 - Continue metformin 500 XL daily   Discharge  Exam: Vitals:   02/24/22 2126 02/25/22 0627  BP: (!) 102/58 98/62  Pulse: 79 99  Resp: 17 17  Temp: 98.2 F (36.8 C) 98.5 F (36.9 C)  SpO2: 92% 93%    Subj on day of d/c   Awake coherent no distress no icterus no pallor looks comfortable  General Exam on discharge  Chest is clear no added sound rales rhonchi wheeze S1-S2 seems to be in sinus She only has 3 fingers on her right hand Abdomen is soft no rebound Straight leg raise is reasonable  Discharge Instructions   Discharge Instructions     Diet - low sodium heart healthy   Complete by: As directed    Discharge instructions   Complete by: As directed    Please use Tylenol as first choice for pain use around-the-clock every 4 hours up to 1000 mg short-term you can also then if you have severe pain use Norco which we have ordered for you Robaxin and continue your gabapentin Because you had a hip fracture you are at high risk for DVT so I would recommend that you follow the instructions from the orthopedic surgeon and take a low-dose of Eliquis which is a blood thinner until they tell you to stop-if you have any dark stool tarry stool or bleeding you should stop it immediately and seek medical attention Please get labs in about 1 to 2 weeks time either orthopedic or your primary care physician office and follow-up with orthopedics as per their instructions   For home use only DME 4 wheeled rolling walker with seat   Complete by: As directed    Patient needs a walker to treat with the following  condition: Hip fracture (HCC)   Increase activity slowly   Complete by: As directed       Allergies as of 02/25/2022   No Known Allergies      Medication List     STOP taking these medications    Myrbetriq 25 MG Tb24 tablet Generic drug: mirabegron ER   traMADol 50 MG tablet Commonly known as: ULTRAM       TAKE these medications    acetaminophen 500 MG tablet Commonly known as: TYLENOL Take 1,000 mg by mouth  every 6 (six) hours as needed for moderate pain.   albuterol 108 (90 Base) MCG/ACT inhaler Commonly known as: VENTOLIN HFA INHALE 1 TO 2 PUFFS EVERY 6 HOURS AS NEEDED FOR WHEEZING OR SHORTNESS OF BREATH What changed:  how much to take how to take this when to take this reasons to take this additional instructions   apixaban 2.5 MG Tabs tablet Commonly known as: Eliquis Take 1 tablet (2.5 mg total) by mouth 2 (two) times daily.   atorvastatin 20 MG tablet Commonly known as: LIPITOR Take 20 mg by mouth at bedtime.   Calcium-Vitamin D 600-400 MG-UNIT Tabs Take 2 tablets by mouth daily.   gabapentin 100 MG capsule Commonly known as: NEURONTIN Take 100 mg by mouth 2 (two) times daily.   HYDROcodone-acetaminophen 10-325 MG tablet Commonly known as: Norco Take 0.5 tablets by mouth every 4 (four) hours as needed for up to 7 days for severe pain.   metFORMIN 500 MG 24 hr tablet Commonly known as: GLUCOPHAGE-XR Take 500 mg by mouth every evening.   methocarbamol 500 MG tablet Commonly known as: ROBAXIN Take 1 tablet (500 mg total) by mouth every 6 (six) hours as needed for muscle spasms.   metoprolol succinate 50 MG 24 hr tablet Commonly known as: TOPROL-XL Take 1 tablet (50 mg total) by mouth daily. Take with or immediately following a meal. Start taking on: February 26, 2022 What changed:  medication strength how much to take additional instructions Another medication with the same name was removed. Continue taking this medication, and follow the directions you see here.   omeprazole 20 MG capsule Commonly known as: PRILOSEC TAKE 1 CAPSULE (20 MG TOTAL) BY MOUTH DAILY.   phenazopyridine 200 MG tablet Commonly known as: Pyridium Take 1 tablet (200 mg total) by mouth 3 (three) times daily as needed for pain.   polyethylene glycol powder 17 GM/SCOOP powder Commonly known as: GLYCOLAX/MIRALAX Take 17 g by mouth daily. What changed:  when to take this reasons to take  this   senna 8.6 MG Tabs tablet Commonly known as: SENOKOT Take 1 tablet (8.6 mg total) by mouth 2 (two) times daily.   Spiriva HandiHaler 18 MCG inhalation capsule Generic drug: tiotropium INHALE THE CONTENTS OF 1 CAPSULE EVERY DAY               Durable Medical Equipment  (From admission, onward)           Start     Ordered   02/25/22 1109  DME Walker  Once       Question Answer Comment  Walker: With 5 Inch Wheels   Patient needs a walker to treat with the following condition Hip fracture ([place])      02/25/22 1112   02/24/22 0000  For home use only DME 4 wheeled rolling walker with seat       Question:  Patient needs a walker to treat with the following condition  Answer:  Hip fracture ([place])   02/24/22 1617           No Known Allergies    The results of significant diagnostics from this hospitalization (including imaging, microbiology, ancillary and laboratory) are listed below for reference.    Significant Diagnostic Studies: DG HIP UNILAT WITH PELVIS 1V RIGHT  Result Date: 02/23/2022 CLINICAL DATA:  89 year old female status post right hip arthroplasty. EXAM: DG HIP (WITH OR WITHOUT PELVIS) 1V RIGHT; DG C-ARM 1-60 MIN-NO REPORT COMPARISON:  02/22/2022. FINDINGS: Multiple intraoperative views of the bony pelvis and right hip demonstrate interval performance of a right hip hemiarthroplasty. The prosthesis appears well seated without definite periprosthetic fracture or other immediate complicating features. Two views provided show the prosthetic femoral head projecting within the right acetabulum. IMPRESSION: 1. Intraoperative views from right hip hemiarthroplasty, as above, with no immediate complicating features. Electronically Signed   By: Vinnie Langton M.D.   On: 02/23/2022 10:33   DG Pelvis Portable  Result Date: 02/23/2022 CLINICAL DATA:  89 year old female status post right hip arthroplasty. EXAM: PORTABLE PELVIS 1-2 VIEWS COMPARISON:  Intraoperative films  02/23/2022. FINDINGS: Postoperative changes of right hip hemiarthroplasty are noted. Prosthetic femoral head projects over the region of the right acetabulum on this single view examination. Small amount of gas in the joint space extensive gas in the overlying soft tissues. Skin staples lateral to the right proximal femur. Bony pelvis and left proximal femur are unremarkable in appearance. IMPRESSION: 1. Expected postoperative findings of recent right hip hemiarthroplasty, as above, without immediate complicating features. Electronically Signed   By: Vinnie Langton M.D.   On: 02/23/2022 10:31   DG C-Arm 1-60 Min-No Report  Result Date: 02/23/2022 Fluoroscopy was utilized by the requesting physician.  No radiographic interpretation.   DG Knee Right Port  Result Date: 02/22/2022 CLINICAL DATA:  Preoperative for right hip fracture. EXAM: PORTABLE RIGHT KNEE - 1-2 VIEW COMPARISON:  Pelvis and right hip radiographs 02/22/2022, MRI right knee 10/22/2012 FINDINGS: There is diffuse decreased bone mineralization. Mild peripheral medial and lateral compartment degenerative spurring. Mild to moderate superior and mild inferior patellar degenerative osteophytes. The lateral view is mildly oblique. No joint effusion is seen. No acute fracture is seen. No dislocation. IMPRESSION: Mild-to-moderate patellofemoral and mild medial and lateral compartment osteoarthritis. Electronically Signed   By: Yvonne Kendall M.D.   On: 02/22/2022 11:53   DG Chest 1 View  Result Date: 02/22/2022 CLINICAL DATA:  89 year old female status post fall with right femur fracture. EXAM: CHEST  1 VIEW COMPARISON:  Chest radiographs 06/16/2021 and earlier. FINDINGS: Supine AP view at 0454 hours. Moderate chronic gastric hiatal hernia. Cardiac size now at the upper limits of normal. Other mediastinal contours are within normal limits. Visualized tracheal air column is within normal limits. Diffuse increased pulmonary interstitium. No  pneumothorax, pleural effusion, or confluent pulmonary opacity. Paucity bowel gas in the visible abdomen. No acute osseous abnormality identified. IMPRESSION: 1. Mildly increased pulmonary interstitium diffusely, favor vascular congestion due to supine radiograph but difficult to exclude developing interstitial edema. 2. No other acute cardiopulmonary abnormality identified. Chronic moderate gastric hiatal hernia. Electronically Signed   By: Genevie Ann M.D.   On: 02/22/2022 05:20   DG HIP UNILAT W OR W/O PELVIS 2-3 VIEWS LEFT  Result Date: 02/22/2022 CLINICAL DATA:  89 year old female status post fall. EXAM: DG HIP (WITH OR WITHOUT PELVIS) 2-3V LEFT COMPARISON:  Hip series 05/21/2008. FINDINGS: AP and cross-table lateral views at 0456 hours. Impacted right femoral neck fracture. Proximal  right femur intertrochanteric segment appears to remain intact and right femoral head appears to remain normally located. Visible lower pelvis appears stable and intact. Grossly intact proximal left femur. Negative visible bowel gas. IMPRESSION: Acute, impacted right femoral neck fracture. Electronically Signed   By: Genevie Ann M.D.   On: 02/22/2022 05:18   US Venous Img Lower Unilateral Right (DVT)  Result Date: 02/10/2022 CLINICAL DATA:  Right lower extremity pain. EXAM: RIGHT LOWER EXTREMITY VENOUS DOPPLER ULTRASOUND TECHNIQUE: Gray-scale sonography with graded compression, as well as color Doppler and duplex ultrasound were performed to evaluate the lower extremity deep venous systems from the level of the common femoral vein and including the common femoral, femoral, profunda femoral, popliteal and calf veins including the posterior tibial, peroneal and gastrocnemius veins when visible. The superficial great saphenous vein was also interrogated. Spectral Doppler was utilized to evaluate flow at rest and with distal augmentation maneuvers in the common femoral, femoral and popliteal veins. COMPARISON:  None Available.  FINDINGS: Contralateral Common Femoral Vein: Respiratory phasicity is normal and symmetric with the symptomatic side. No evidence of thrombus. Normal compressibility. Common Femoral Vein: No evidence of thrombus. Normal compressibility, respiratory phasicity and response to augmentation. Saphenofemoral Junction: No evidence of thrombus. Normal compressibility and flow on color Doppler imaging. Profunda Femoral Vein: No evidence of thrombus. Normal compressibility and flow on color Doppler imaging. Femoral Vein: No evidence of thrombus. Normal compressibility, respiratory phasicity and response to augmentation. Popliteal Vein: No evidence of thrombus. Normal compressibility, respiratory phasicity and response to augmentation. Calf Veins: No evidence of thrombus. Normal compressibility and flow on color Doppler imaging. Superficial Great Saphenous Vein: No evidence of thrombus. Normal compressibility. Venous Reflux:  None. Other Findings: No evidence of superficial thrombophlebitis or abnormal fluid collection. IMPRESSION: No evidence of right lower extremity deep venous thrombosis. Electronically Signed   By: Aletta Edouard M.D.   On: 02/10/2022 12:48    Microbiology: Recent Results (from the past 240 hour(s))  Surgical PCR screen     Status: Abnormal   Collection Time: 02/22/22  8:52 AM   Specimen: Nasal Mucosa; Nasal Swab  Result Value Ref Range Status   MRSA, PCR NEGATIVE NEGATIVE Final   Staphylococcus aureus POSITIVE (A) NEGATIVE Final    Comment: (NOTE) The Xpert SA Assay (FDA approved for NASAL specimens in patients 13 years of age and older), is one component of a comprehensive surveillance program. It is not intended to diagnose infection nor to guide or monitor treatment. Performed at [facility], [place] [address], [place], [place] [address]      Labs: Basic Metabolic Panel: Recent Labs  Lab 02/22/22 0413 02/23/22 0349 02/24/22 0335  NA 134* 131* 135  K 4.1 4.3  4.2  CL 102 99 106  CO2 '25 24 23  '$ GLUCOSE 118* 101* 121*  BUN '17 15 15  '$ CREATININE 1.08* 1.07* 1.08*  CALCIUM 8.7* 8.2* 7.8*   Liver Function Tests: Recent Labs  Lab 02/23/22 0349  AST 26  ALT 18  ALKPHOS 75  BILITOT 0.4  PROT 6.3*  ALBUMIN 3.2*   No results for input(s): "LIPASE", "AMYLASE" in the last 168 hours. No results for input(s): "AMMONIA" in the last 168 hours. CBC: Recent Labs  Lab 02/22/22 0413 02/23/22 0349 02/24/22 0335 02/25/22 0345  WBC 5.4 3.8* 4.8 4.4  NEUTROABS 4.5  --   --   --   HGB 10.4* 9.7* 7.9* 7.5*  HCT 33.7* 31.1* 25.0* 24.0*  MCV 81.4 82.3 80.6 81.4  PLT 183 169 149* 157   Cardiac Enzymes: No results for input(s): "CKTOTAL", "CKMB", "CKMBINDEX", "TROPONINI" in the last 168 hours. BNP: BNP (last 3 results) No results for input(s): "BNP" in the last 8760 hours.  ProBNP (last 3 results) No results for input(s): "PROBNP" in the last 8760 hours.  CBG: Recent Labs  Lab 02/24/22 0739 02/24/22 1144 02/24/22 1719 02/24/22 2131 02/25/22 0752  GLUCAP 107* 126* 104* 134* 101*       Signed:  Nita Sells MD   [facility] 02/25/2022, 11:12 AM

## 2022-02-25 NOTE — Progress Notes (Signed)
Physical Therapy Treatment Patient Details Name: Kristen Macias MRN: [id number] DOB: [birth year] Today's Date: 02/25/2022   History of Present Illness Patient is a 89 year old female who presented after a fall on R side with R hip pain. Patient was found to have right acute impacted femoral neck fracture. Patient underwent R hip hemiarthroplasty through direct anterior approach on 1/1. PMH: a fib, bilateral cataracts, bladder CA, osteoporosis, COPD, T7 compression fx.    PT Comments    Pt able to amb to bathroom and back to recliner. Limited by pain and fatigue,  requiring more assist overall this session compared to yesterday. Pt may need SNF---there is a discrepancy between assist pt states she has and family's report (per grand-dtr in OT session they are unable to assist at home). Pt unsafe to d/c home at this time unless has 24hr assist. Will continue to follow in acute setting. Pt declining SNF at this time.    Recommendations for follow up therapy are one component of a multi-disciplinary discharge planning process, led by the attending physician.  Recommendations may be updated based on patient status, additional functional criteria and insurance authorization.  Follow Up Recommendations  Home health PT     Assistance Recommended at Discharge Frequent or constant Supervision/Assistance  Patient can return home with the following A little help with bathing/dressing/bathroom;Assistance with cooking/housework;Assist for transportation;Help with stairs or ramp for entrance;A little help with walking and/or transfers   Equipment Recommendations  Rolling walker (2 wheels)    Recommendations for Other Services       Precautions / Restrictions Precautions Precautions: Fall Restrictions Weight Bearing Restrictions: No RLE Weight Bearing: Weight bearing as tolerated     Mobility  Bed Mobility               General bed mobility comments: in recliner adn returned to same     Transfers Overall transfer level: Needs assistance Equipment used: Rolling walker (2 wheels) Transfers: Sit to/from Stand Sit to Stand: Min assist           General transfer comment: cues for hand placement, safety on descent. incr time needed, assist to rise and transition to RW    Ambulation/Gait Ambulation/Gait assistance: Min assist Gait Distance (Feet): 12 Feet (x2) Assistive device: Rolling walker (2 wheels) Gait Pattern/deviations: Step-to pattern, Decreased stance time - right       General Gait Details: cues for RW distance from self and safety; min assist to balance and maneuver RW   Stairs             Wheelchair Mobility    Modified Rankin (Stroke Patients Only)       Balance Overall balance assessment: Needs assistance Sitting-balance support: No upper extremity supported, Feet supported Sitting balance-Leahy Scale: Fair     Standing balance support: Bilateral upper extremity supported, Single extremity supported, During functional activity, Reliant on assistive device for balance Standing balance-Leahy Scale: Poor Standing balance comment: reliant on UE unilateral support and external assist to perform pericare, assist needed to complete LB garment manipulation                            Cognition Arousal/Alertness: Awake/alert Behavior During Therapy: WFL for tasks assessed/performed Overall Cognitive Status: Within Functional Limits for tasks assessed  Exercises General Exercises - Lower Extremity Ankle Circles/Pumps: AROM, Both, 15 reps    General Comments        Pertinent Vitals/Pain Pain Assessment Pain Assessment: Faces Faces Pain Scale: Hurts little more Pain Location: right hip Pain Descriptors / Indicators: Grimacing, Discomfort Pain Intervention(s): Limited activity within patient's tolerance, Monitored during session, Premedicated before session, Ice  applied    Home Living                          Prior Function            PT Goals (current goals can now be found in the care plan section) Acute Rehab PT Goals Patient Stated Goal: home today PT Goal Formulation: With patient Time For Goal Achievement: 03/03/22 Potential to Achieve Goals: Good Progress towards PT goals: Progressing toward goals    Frequency    Min 5X/week      PT Plan Current plan remains appropriate    Co-evaluation              AM-PAC PT "6 Clicks" Mobility   Outcome Measure  Help needed turning from your back to your side while in a flat bed without using bedrails?: A Little Help needed moving from lying on your back to sitting on the side of a flat bed without using bedrails?: A Little Help needed moving to and from a bed to a chair (including a wheelchair)?: A Little Help needed standing up from a chair using your arms (e.g., wheelchair or bedside chair)?: A Little Help needed to walk in hospital room?: A Little Help needed climbing 3-5 steps with a railing? : A Lot 6 Click Score: 17    End of Session Equipment Utilized During Treatment: Gait belt Activity Tolerance: Patient limited by fatigue;Patient limited by pain Patient left: in chair;with call bell/phone within reach;with chair alarm set Nurse Communication: Mobility status PT Visit Diagnosis: Other abnormalities of gait and mobility (R26.89);Difficulty in walking, not elsewhere classified (R26.2)     Time: 4782-9562 PT Time Calculation (min) (ACUTE ONLY): 21 min  Charges:  $Gait Training: 8-22 mins                     Baxter Flattery, PT  Acute Rehab Dept [facility] [phone]  [facility] Weekend Pager [facility] only)  [phone]  02/25/2022    [facility] 02/25/2022, 3:37 PM

## 2022-02-25 NOTE — Progress Notes (Signed)
Occupational Therapy Treatment Patient Details Name: Kristen Macias MRN: [id number] DOB: 01/20/1934 Today's Date: 02/25/2022   History of present illness Patient is a 89 year old female who presented after a fall on R side with R hip pain. Patient was found to have right acute impacted femoral neck fracture. Patient underwent R hip hemiarthroplasty through direct anterior approach on 1/1. PMH: a fib, bilateral cataracts, bladder CA, osteoporosis, COPD, T7 compression fx.   OT comments  Patient was noted to require more assistance for standing balance with increased posterior leaning. Patient was unable to correct standing balance without physical assistance. Patient noted to have increased pain impacting participation in session. Patient was educated on using AE for LB dressing/bathing tasks. Patient and patients granddaughter were educated on total hip kit and where to purchase. Patient and patient's granddaughter verbalized understanding. Patients granddaughter was present for portion of session with her reporting that she can help some at home but not 24/7 as patient reported they could during evaluation. Nurse and PT made aware. OT to continue to follow and educate on proper techniques to safely complete ADL tasks in next level of care with family support.    Recommendations for follow up therapy are one component of a multi-disciplinary discharge planning process, led by the attending physician.  Recommendations may be updated based on patient status, additional functional criteria and insurance authorization.    Follow Up Recommendations  Home health OT     Assistance Recommended at Discharge Frequent or constant Supervision/Assistance  Patient can return home with the following  A little help with walking and/or transfers;A lot of help with bathing/dressing/bathroom;Assistance with cooking/housework;Direct supervision/assist for medications management;Assist for transportation;Help with stairs  or ramp for entrance;Direct supervision/assist for financial management   Equipment Recommendations  Other (comment) (RW)       Precautions / Restrictions Precautions Precautions: Fall Restrictions Weight Bearing Restrictions: No RLE Weight Bearing: Weight bearing as tolerated       Mobility Bed Mobility Overal bed mobility: Needs Assistance Bed Mobility: Supine to Sit     Supine to sit: Mod assist     General bed mobility comments: patient was educated on how to use gait belt to advance BLE.       Balance Overall balance assessment: Needs assistance           Standing balance-Leahy Scale: Poor Standing balance comment: strong posterior lean         ADL either performed or assessed with clinical judgement   ADL Overall ADL's : Needs assistance/impaired       Toilet Transfer: Moderate assistance;Ambulation;Rolling walker (2 wheels) Toilet Transfer Details (indicate cue type and reason): to and from bathroom with increased time and leaning over top of walker noted. Toileting- Clothing Manipulation and Hygiene: Maximal assistance;Sit to/from stand Toileting - Clothing Manipulation Details (indicate cue type and reason): strong posterior lean with mod A to maintain standing balance with absent correction of posture with cues.              Cognition Arousal/Alertness: Awake/alert Behavior During Therapy: WFL for tasks assessed/performed Overall Cognitive Status: Within Functional Limits for tasks assessed       General Comments: appears to have poor insight to deficits and strong motivation to go home.                   Pertinent Vitals/ Pain       Pain Assessment Pain Assessment: Faces Faces Pain Scale: Hurts whole lot Pain Location: right  hip Pain Descriptors / Indicators: Grimacing, Discomfort, Guarding, Operative site guarding Pain Intervention(s): Limited activity within patient's tolerance, Monitored during session, Repositioned, Patient  requesting pain meds-RN notified, RN gave pain meds during session         Frequency  Min 2X/week        Progress Toward Goals  OT Goals(current goals can now be found in the care plan section)  Progress towards OT goals: Progressing toward goals     Plan Discharge plan remains appropriate       AM-PAC OT "6 Clicks" Daily Activity     Outcome Measure   Help from another person eating meals?: None Help from another person taking care of personal grooming?: A Little Help from another person toileting, which includes using toliet, bedpan, or urinal?: A Lot Help from another person bathing (including washing, rinsing, drying)?: A Lot Help from another person to put on and taking off regular upper body clothing?: A Little Help from another person to put on and taking off regular lower body clothing?: A Lot 6 Click Score: 16    End of Session Equipment Utilized During Treatment: Gait belt;Rolling walker (2 wheels)  OT Visit Diagnosis: Unsteadiness on feet (R26.81);Other abnormalities of gait and mobility (R26.89);Muscle weakness (generalized) (M62.81);Pain Pain - Right/Left: Right Pain - part of body: Hip   Activity Tolerance Patient tolerated treatment well   Patient Left in chair;with call bell/phone within reach;with chair alarm set   Nurse Communication Other (comment)        Time: 7670-1100 OT Time Calculation (min): 27 min  Charges: OT General Charges $OT Visit: 1 Visit OT Treatments $Self Care/Home Management : 23-37 mins  Rennie Plowman, MS Acute Rehabilitation Department Office# [phone]   Willa Rough 02/25/2022, 12:50 PM

## 2022-02-25 NOTE — TOC Transition Note (Signed)
Transition of Care [facility]) - CM/SW Discharge Note  Patient Details  Name: Kristen Macias MRN: [id number] Date of Birth: 08/03/1933  [facility]) CM/SW Contact:  Sherie Don, LCSW Phone Number: 02/25/2022, 11:44 AM  Clinical Narrative: CSW made DME referral to [facility] with [employer]. [employer] to deliver rolling walker to patient's room. CSW made [facility] referrals to the following agencies:  [facility]: declined [facility]: declined [facility]: accepted  CSW updated patient regarding [place] being set up and that rolling walker will be delivered to room. TOC signing off.  Final next level of care: Home w Home Health Services Barriers to Discharge: Barriers Resolved  Patient Goals and CMS Choice CMS Medicare.gov Compare Post Acute Care list provided to:: Patient Choice offered to / list presented to : Patient  Discharge Plan and Services Additional resources added to the After Visit Summary for   In-house Referral: Clinical Social Work Post Acute Care Choice: Home Health          DME Arranged: Gilford Rile rolling DME Agency: [employer] Date DME Agency Contacted: 02/25/22 Time DME Agency Contacted: [id number] Representative spoke with at DME Agency: Erasmo Downer HH Arranged: PT, OT [place] Agency: [place] Date [place]: 02/25/22 Time [place]: 1059 Representative spoke with at [place]: [place] Determinants of Health ([place]) Interventions SDOH Screenings   Food Insecurity: Unknown (02/22/2022)  Housing: Low Risk  (02/22/2022)  Transportation Needs: Unknown (02/22/2022)  Utilities: Unknown (02/22/2022)  Depression (PHQ2-9): Low Risk  (01/30/2020)  Tobacco Use: Medium Risk (02/24/2022)   Readmission Risk Interventions     No data to display

## 2022-02-25 NOTE — Plan of Care (Signed)
  Problem: Education: Goal: Knowledge of General Education information will improve Description: Including pain rating scale, medication(s)/side effects and non-pharmacologic comfort measures Outcome: Adequate for Discharge   Problem: Health Behavior/Discharge Planning: Goal: Ability to manage health-related needs will improve Outcome: Adequate for Discharge   Problem: Clinical Measurements: Goal: Ability to maintain clinical measurements within normal limits will improve Outcome: Adequate for Discharge Goal: Will remain free from infection Outcome: Adequate for Discharge Goal: Diagnostic test results will improve Outcome: Adequate for Discharge Goal: Respiratory complications will improve Outcome: Adequate for Discharge Goal: Cardiovascular complication will be avoided Outcome: Adequate for Discharge   Problem: Activity: Goal: Risk for activity intolerance will decrease Outcome: Adequate for Discharge   Problem: Nutrition: Goal: Adequate nutrition will be maintained Outcome: Adequate for Discharge   Problem: Coping: Goal: Level of anxiety will decrease Outcome: Adequate for Discharge   Problem: Elimination: Goal: Will not experience complications related to bowel motility Outcome: Adequate for Discharge Goal: Will not experience complications related to urinary retention Outcome: Adequate for Discharge   Problem: Pain Managment: Goal: General experience of comfort will improve Outcome: Adequate for Discharge   Problem: Safety: Goal: Ability to remain free from injury will improve Outcome: Adequate for Discharge   Problem: Skin Integrity: Goal: Risk for impaired skin integrity will decrease Outcome: Adequate for Discharge   Problem: Education: Goal: Ability to describe self-care measures that may prevent or decrease complications (Diabetes Survival Skills Education) will improve Outcome: Adequate for Discharge Goal: Individualized Educational Video(s) Outcome:  Adequate for Discharge   Problem: Coping: Goal: Ability to adjust to condition or change in health will improve Outcome: Adequate for Discharge   Problem: Fluid Volume: Goal: Ability to maintain a balanced intake and output will improve Outcome: Adequate for Discharge   Problem: Health Behavior/Discharge Planning: Goal: Ability to identify and utilize available resources and services will improve Outcome: Adequate for Discharge Goal: Ability to manage health-related needs will improve Outcome: Adequate for Discharge   Problem: Metabolic: Goal: Ability to maintain appropriate glucose levels will improve Outcome: Adequate for Discharge   Problem: Nutritional: Goal: Maintenance of adequate nutrition will improve Outcome: Adequate for Discharge Goal: Progress toward achieving an optimal weight will improve Outcome: Adequate for Discharge   Problem: Skin Integrity: Goal: Risk for impaired skin integrity will decrease Outcome: Adequate for Discharge   Problem: Tissue Perfusion: Goal: Adequacy of tissue perfusion will improve Outcome: Adequate for Discharge   Problem: Education: Goal: Knowledge of the prescribed therapeutic regimen will improve Outcome: Adequate for Discharge Goal: Understanding of discharge needs will improve Outcome: Adequate for Discharge Goal: Individualized Educational Video(s) Outcome: Adequate for Discharge   Problem: Activity: Goal: Ability to avoid complications of mobility impairment will improve Outcome: Adequate for Discharge Goal: Ability to tolerate increased activity will improve Outcome: Adequate for Discharge   Problem: Clinical Measurements: Goal: Postoperative complications will be avoided or minimized Outcome: Adequate for Discharge   Problem: Pain Management: Goal: Pain level will decrease with appropriate interventions Outcome: Adequate for Discharge   Problem: Skin Integrity: Goal: Will show signs of wound healing Outcome:  Adequate for Discharge

## 2022-02-25 NOTE — Progress Notes (Signed)
    Subjective:  Patient reports pain as mild to moderate.  Denies N/V/CP/SOB/Abd pain. She denies any tingling or numbness in her LE bilaterally. She reports some pain in her thigh. We discussed continued use of ice on her hip for pain and swelling.   Objective:   VITALS:   Vitals:   02/24/22 1027 02/24/22 1323 02/24/22 2126 02/25/22 0627  BP: 106/72 (!) 104/58 (!) 102/58 98/62  Pulse: 84 86 79 99  Resp: '18 16 17 17  '$ Temp: 97.8 F (36.6 C) 97.9 F (36.6 C) 98.2 F (36.8 C) 98.5 F (36.9 C)  TempSrc:   Oral Oral  SpO2: 94% 97% 92% 93%  Weight:      Height:        Patient sitting up in bed. NAD.  Neurologically intact ABD soft Neurovascular intact Sensation intact distally Intact pulses distally Dorsiflexion/Plantar flexion intact Incision: dressing C/D/I No cellulitis present Compartment soft Painless log rolling of the hip.   Lab Results  Component Value Date   WBC 4.4 02/25/2022   HGB 7.5 (L) 02/25/2022   HCT 24.0 (L) 02/25/2022   MCV 81.4 02/25/2022   PLT 157 02/25/2022   BMET    Component Value Date/Time   NA 135 02/24/2022 0335   NA 140 08/20/2016 1650   K 4.2 02/24/2022 0335   CL 106 02/24/2022 0335   CO2 23 02/24/2022 0335   GLUCOSE 121 (H) 02/24/2022 0335   BUN 15 02/24/2022 0335   BUN 18 08/20/2016 1650   CREATININE 1.08 (H) 02/24/2022 0335   CREATININE 1.03 06/14/2017 1039   CREATININE 1.15 (H) 04/30/2016 1023   CALCIUM 7.8 (L) 02/24/2022 0335   GFRNONAA 50 (L) 02/24/2022 0335   GFRNONAA 50 (L) 06/14/2017 1039   GFRNONAA 45 (L) 04/30/2016 1023     Assessment/Plan: 2 Days Post-Op   Principal Problem:   Closed right hip fracture, initial encounter ([place]) Active Problems:   GERD (gastroesophageal reflux disease)   Prediabetes   Hyperlipidemia   Atrial fibrillation (HCC)   COPD (chronic obstructive pulmonary disease) ([place])   WBAT with walker DVT ppx:  Eliquis 2.'5mg'$  BID , SCDs, TEDS PO pain control PT/OT: She ambulated 90 feet with  PT yesterday. PT to continue today.  Dispo:  Patient under care of the medical team, disposition per their recommendation. Patient is hopeful for discharge home.      Charlott Rakes, PA-C 02/25/2022, 8:06 AM   [facility]  [place] [address], [place], [place] [address] Phone: [phone] [email or web address] Facebook  [employer]

## 2022-02-26 DIAGNOSIS — S72001A Fracture of unspecified part of neck of right femur, initial encounter for closed fracture: Secondary | ICD-10-CM | POA: Diagnosis not present

## 2022-02-26 LAB — GLUCOSE, CAPILLARY
Glucose-Capillary: 111 mg/dL — ABNORMAL HIGH (ref 70–99)
Glucose-Capillary: 129 mg/dL — ABNORMAL HIGH (ref 70–99)
Glucose-Capillary: 128 mg/dL — ABNORMAL HIGH (ref 70–99)
Glucose-Capillary: 123 mg/dL — ABNORMAL HIGH (ref 70–99)

## 2022-02-26 NOTE — Discharge Summary (Signed)
Physician Discharge Summary  Kristen Macias QQP:[id number] DOB: 03-12-1933 DOA: 02/22/2022  PCP: Kristen Mile, NP  Admit date: 02/22/2022 Discharge date: 02/26/2022  Patient remains stable and should be /dc today without significant changes to her condition  Time spent: 37 minutes  Recommendations for Outpatient Follow-up:  Needs Chem-12 CBC in about 3 to 4 weeks Requires outpatient PT as well as rolling walker and should complete Eliquis twice daily in about a month or as per orthopedics--she should be followed up by her cardiologist Dr. 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 Lyla Macias and procedure performed  Hospital Course:  Acute right hip fracture -Status post bipolar arthroplasty 02/23/2022 - Placed on Eliquis 2.5 twice daily and consideration as an outpatient for continuation of the same unclear why she is not on it - Pain control Tylenol Norco and Robaxin-can continue home gabapentin  Chronic atrial fibrillation -Seen last 11/2016 by DVaranasi -On metoprolol XL but not on anticoagulation, needs outpatient discussion about resumption  Reflux - Continue meds  Stable  COPD - Continue inhalers as needed  DM TY 2 - Continue metformin 500 XL daily   Discharge Exam: Vitals:   02/25/22 2215 02/26/22 0615  BP: (!) 123/98 112/75  Pulse: 96 97  Resp: 16 18  Temp: 98.9 F (37.2 C) 98.3 F (36.8 C)  SpO2: 97% 93%    Subj on day of d/c   Awake coherent no distress no icterus no pallor looks comfortable  General Exam on discharge  Chest is clear no added sound rales rhonchi wheeze S1-S2 seems to be in sinus She only has 3 fingers on her right hand Abdomen is soft no rebound Straight leg raise is reasonable  Discharge Instructions   Discharge Instructions     Diet - low sodium heart healthy   Complete by: As directed    Discharge instructions   Complete by: As directed    Please use Tylenol as first choice for pain use around-the-clock every 4 hours up to 1000 mg short-term you can also then if you have severe pain use Norco which we have ordered for you Robaxin and continue your gabapentin Because you had a hip fracture you are at high risk for DVT so I would recommend that you follow the instructions from the orthopedic surgeon and take a low-dose of Eliquis which is a blood thinner until they tell you to stop-if you have any dark stool tarry stool or bleeding you should stop it immediately and seek medical attention Please get labs in about 1 to 2 weeks time either orthopedic or your primary care physician office and follow-up with orthopedics as per their instructions   For home use only DME 4 wheeled rolling walker with seat   Complete  by: As directed    Patient needs a walker to treat with the following condition: Hip fracture ([place])   Increase activity slowly   Complete by: As directed       Allergies as of 02/26/2022   No Known Allergies      Medication List     STOP taking these medications    Myrbetriq 25 MG Tb24 tablet Generic drug: mirabegron ER   traMADol 50 MG tablet Commonly known as: ULTRAM       TAKE these  medications    acetaminophen 500 MG tablet Commonly known as: TYLENOL Take 1,000 mg by mouth every 6 (six) hours as needed for moderate pain.   albuterol 108 (90 Base) MCG/ACT inhaler Commonly known as: VENTOLIN HFA INHALE 1 TO 2 PUFFS EVERY 6 HOURS AS NEEDED FOR WHEEZING OR SHORTNESS OF BREATH What changed:  how much to take how to take this when to take this reasons to take this additional instructions   apixaban 2.5 MG Tabs tablet Commonly known as: Eliquis Take 1 tablet (2.5 mg total) by mouth 2 (two) times daily.   atorvastatin 20 MG tablet Commonly known as: LIPITOR Take 20 mg by mouth at bedtime.   Calcium-Vitamin D 600-400 MG-UNIT Tabs Take 2 tablets by mouth daily.   gabapentin 100 MG capsule Commonly known as: NEURONTIN Take 100 mg by mouth 2 (two) times daily.   HYDROcodone-acetaminophen 10-325 MG tablet Commonly known as: Norco Take 0.5 tablets by mouth every 4 (four) hours as needed for up to 7 days for severe pain.   metFORMIN 500 MG 24 hr tablet Commonly known as: GLUCOPHAGE-XR Take 500 mg by mouth every evening.   methocarbamol 500 MG tablet Commonly known as: ROBAXIN Take 1 tablet (500 mg total) by mouth every 6 (six) hours as needed for muscle spasms.   metoprolol succinate 50 MG 24 hr tablet Commonly known as: TOPROL-XL Take 1 tablet (50 mg total) by mouth daily. Take with or immediately following a meal. What changed:  medication strength how much to take additional instructions Another medication with the same name was removed. Continue taking this medication, and follow the directions you see here.   omeprazole 20 MG capsule Commonly known as: PRILOSEC TAKE 1 CAPSULE (20 MG TOTAL) BY MOUTH DAILY.   phenazopyridine 200 MG tablet Commonly known as: Pyridium Take 1 tablet (200 mg total) by mouth 3 (three) times daily as needed for pain.   polyethylene glycol powder 17 GM/SCOOP powder Commonly known as: GLYCOLAX/MIRALAX Take 17 g by  mouth daily. What changed:  when to take this reasons to take this   senna 8.6 MG Tabs tablet Commonly known as: SENOKOT Take 1 tablet (8.6 mg total) by mouth 2 (two) times daily.   Spiriva HandiHaler 18 MCG inhalation capsule Generic drug: tiotropium INHALE THE CONTENTS OF 1 CAPSULE EVERY DAY               Durable Medical Equipment  (From admission, onward)           Start     Ordered   02/25/22 1109  DME Walker  Once       Question Answer Comment  Walker: With 5 Inch Wheels   Patient needs a walker to treat with the following condition Hip fracture ([place])      02/25/22 1112   02/24/22 0000  For home use only DME 4 wheeled rolling walker with seat       Question:  Patient needs a  walker to treat with the following condition  Answer:  Hip fracture ([place])   02/24/22 1617           No Known Allergies  Follow-up Information     [facility] [place] Follow up.   Specialty: Home Health Services Why: [facility] will provide PT an OT in the home after discharge. Contact information: [address] [place] [address] [phone]                  The results of significant diagnostics from this hospitalization (including imaging, microbiology, ancillary and laboratory) are listed below for reference.    Significant Diagnostic Studies: DG HIP UNILAT WITH PELVIS 1V RIGHT  Result Date: 02/23/2022 CLINICAL DATA:  89 year old female status post right hip arthroplasty. EXAM: DG HIP (WITH OR WITHOUT PELVIS) 1V RIGHT; DG C-ARM 1-60 MIN-NO REPORT COMPARISON:  02/22/2022. FINDINGS: Multiple intraoperative views of the bony pelvis and right hip demonstrate interval performance of a right hip hemiarthroplasty. The prosthesis appears well seated without definite periprosthetic fracture or other immediate complicating features. Two views provided show the prosthetic femoral head projecting within the right acetabulum. IMPRESSION: 1. Intraoperative views from  right hip hemiarthroplasty, as above, with no immediate complicating features. Electronically Signed   By: Vinnie Langton M.D.   On: 02/23/2022 10:33   DG Pelvis Portable  Result Date: 02/23/2022 CLINICAL DATA:  89 year old female status post right hip arthroplasty. EXAM: PORTABLE PELVIS 1-2 VIEWS COMPARISON:  Intraoperative films 02/23/2022. FINDINGS: Postoperative changes of right hip hemiarthroplasty are noted. Prosthetic femoral head projects over the region of the right acetabulum on this single view examination. Small amount of gas in the joint space extensive gas in the overlying soft tissues. Skin staples lateral to the right proximal femur. Bony pelvis and left proximal femur are unremarkable in appearance. IMPRESSION: 1. Expected postoperative findings of recent right hip hemiarthroplasty, as above, without immediate complicating features. Electronically Signed   By: Vinnie Langton M.D.   On: 02/23/2022 10:31   DG C-Arm 1-60 Min-No Report  Result Date: 02/23/2022 Fluoroscopy was utilized by the requesting physician.  No radiographic interpretation.   DG Knee Right Port  Result Date: 02/22/2022 CLINICAL DATA:  Preoperative for right hip fracture. EXAM: PORTABLE RIGHT KNEE - 1-2 VIEW COMPARISON:  Pelvis and right hip radiographs 02/22/2022, MRI right knee 10/22/2012 FINDINGS: There is diffuse decreased bone mineralization. Mild peripheral medial and lateral compartment degenerative spurring. Mild to moderate superior and mild inferior patellar degenerative osteophytes. The lateral view is mildly oblique. No joint effusion is seen. No acute fracture is seen. No dislocation. IMPRESSION: Mild-to-moderate patellofemoral and mild medial and lateral compartment osteoarthritis. Electronically Signed   By: Yvonne Kendall M.D.   On: 02/22/2022 11:53   DG Chest 1 View  Result Date: 02/22/2022 CLINICAL DATA:  89 year old female status post fall with right femur fracture. EXAM: CHEST  1 VIEW  COMPARISON:  Chest radiographs 06/16/2021 and earlier. FINDINGS: Supine AP view at 0454 hours. Moderate chronic gastric hiatal hernia. Cardiac size now at the upper limits of normal. Other mediastinal contours are within normal limits. Visualized tracheal air column is within normal limits. Diffuse increased pulmonary interstitium. No pneumothorax, pleural effusion, or confluent pulmonary opacity. Paucity bowel gas in the visible abdomen. No acute osseous abnormality identified. IMPRESSION: 1. Mildly increased pulmonary interstitium diffusely, favor vascular congestion due to supine radiograph but difficult to exclude developing interstitial edema. 2. No other acute cardiopulmonary abnormality identified. Chronic moderate gastric hiatal hernia. Electronically  Signed   By: Genevie Ann M.D.   On: 02/22/2022 05:20   DG HIP UNILAT W OR W/O PELVIS 2-3 VIEWS LEFT  Result Date: 02/22/2022 CLINICAL DATA:  89 year old female status post fall. EXAM: DG HIP (WITH OR WITHOUT PELVIS) 2-3V LEFT COMPARISON:  Hip series 05/21/2008. FINDINGS: AP and cross-table lateral views at 0456 hours. Impacted right femoral neck fracture. Proximal right femur intertrochanteric segment appears to remain intact and right femoral head appears to remain normally located. Visible lower pelvis appears stable and intact. Grossly intact proximal left femur. Negative visible bowel gas. IMPRESSION: Acute, impacted right femoral neck fracture. Electronically Signed   By: Genevie Ann M.D.   On: 02/22/2022 05:18   US Venous Img Lower Unilateral Right (DVT)  Result Date: 02/10/2022 CLINICAL DATA:  Right lower extremity pain. EXAM: RIGHT LOWER EXTREMITY VENOUS DOPPLER ULTRASOUND TECHNIQUE: Gray-scale sonography with graded compression, as well as color Doppler and duplex ultrasound were performed to evaluate the lower extremity deep venous systems from the level of the common femoral vein and including the common femoral, femoral, profunda femoral,  popliteal and calf veins including the posterior tibial, peroneal and gastrocnemius veins when visible. The superficial great saphenous vein was also interrogated. Spectral Doppler was utilized to evaluate flow at rest and with distal augmentation maneuvers in the common femoral, femoral and popliteal veins. COMPARISON:  None Available. FINDINGS: Contralateral Common Femoral Vein: Respiratory phasicity is normal and symmetric with the symptomatic side. No evidence of thrombus. Normal compressibility. Common Femoral Vein: No evidence of thrombus. Normal compressibility, respiratory phasicity and response to augmentation. Saphenofemoral Junction: No evidence of thrombus. Normal compressibility and flow on color Doppler imaging. Profunda Femoral Vein: No evidence of thrombus. Normal compressibility and flow on color Doppler imaging. Femoral Vein: No evidence of thrombus. Normal compressibility, respiratory phasicity and response to augmentation. Popliteal Vein: No evidence of thrombus. Normal compressibility, respiratory phasicity and response to augmentation. Calf Veins: No evidence of thrombus. Normal compressibility and flow on color Doppler imaging. Superficial Great Saphenous Vein: No evidence of thrombus. Normal compressibility. Venous Reflux:  None. Other Findings: No evidence of superficial thrombophlebitis or abnormal fluid collection. IMPRESSION: No evidence of right lower extremity deep venous thrombosis. Electronically Signed   By: Aletta Edouard M.D.   On: 02/10/2022 12:48    Microbiology: Recent Results (from the past 240 hour(s))  Surgical PCR screen     Status: Abnormal   Collection Time: 02/22/22  8:52 AM   Specimen: Nasal Mucosa; Nasal Swab  Result Value Ref Range Status   MRSA, PCR NEGATIVE NEGATIVE Final   Staphylococcus aureus POSITIVE (A) NEGATIVE Final    Comment: (NOTE) The Xpert SA Assay (FDA approved for NASAL specimens in patients 109 years of age and older), is one component of a  comprehensive surveillance program. It is not intended to diagnose infection nor to guide or monitor treatment. Performed at [facility], [place] [address] [place], [place] [address]      Labs: Basic Metabolic Panel: Recent Labs  Lab 02/22/22 0413 02/23/22 0349 02/24/22 0335  NA 134* 131* 135  K 4.1 4.3 4.2  CL 102 99 106  CO2 '25 24 23  '$ GLUCOSE 118* 101* 121*  BUN '17 15 15  '$ CREATININE 1.08* 1.07* 1.08*  CALCIUM 8.7* 8.2* 7.8*    Liver Function Tests: Recent Labs  Lab 02/23/22 0349  AST 26  ALT 18  ALKPHOS 75  BILITOT 0.4  PROT 6.3*  ALBUMIN 3.2*    No results  for input(s): "LIPASE", "AMYLASE" in the last 168 hours. No results for input(s): "AMMONIA" in the last 168 hours. CBC: Recent Labs  Lab 02/22/22 0413 02/23/22 0349 02/24/22 0335 02/25/22 0345  WBC 5.4 3.8* 4.8 4.4  NEUTROABS 4.5  --   --   --   HGB 10.4* 9.7* 7.9* 7.5*  HCT 33.7* 31.1* 25.0* 24.0*  MCV 81.4 82.3 80.6 81.4  PLT 183 169 149* 157    Cardiac Enzymes: No results for input(s): "CKTOTAL", "CKMB", "CKMBINDEX", "TROPONINI" in the last 168 hours. BNP: BNP (last 3 results) No results for input(s): "BNP" in the last 8760 hours.  ProBNP (last 3 results) No results for input(s): "PROBNP" in the last 8760 hours.  CBG: Recent Labs  Lab 02/25/22 1159 02/25/22 1652 02/25/22 2214 02/26/22 0741 02/26/22 1136  GLUCAP 148* 119* 116* 111* 129*        Signed:  Nita Sells MD   [facility] 02/26/2022, 12:42 PM

## 2022-02-26 NOTE — Progress Notes (Signed)
Physical Therapy Treatment Patient Details Name: Kristen Macias MRN: [id number] DOB: November 14, 1933 Today's Date: 02/26/2022   History of Present Illness Patient is a 89 year old female who presented after a fall on R side with R hip pain. Patient was found to have right acute impacted femoral neck fracture. Patient underwent R hip hemiarthroplasty through direct anterior approach on 1/1. PMH: a fib, bilateral cataracts, bladder CA, osteoporosis, COPD, T7 compression fx.    PT Comments    Patient making slow progress but remains motivated. Pt was able to complete supine>sit but required extra time and significant effort to perform. Patient required min assist for transfers and gait with RW and cues needed to prevent posterior lean intermittently and 2x assist to prevent LOB. During toileting pt was unable to doff/don mesh underwear without assist and needed external support to balance while completing pericare. Discussed benefits of rehab to improved pt's safety and reduce risk of falling and injuring Rt hip again. Pt agreeable that it is the safest option. Will continue to progress pt as able. Recommendation updated for ST rehab at SNF.   Recommendations for follow up therapy are one component of a multi-disciplinary discharge planning process, led by the attending physician.  Recommendations may be updated based on patient status, additional functional criteria and insurance authorization.  Follow Up Recommendations  Skilled nursing-short term rehab (<3 hours/day)     Assistance Recommended at Discharge Frequent or constant Supervision/Assistance  Patient can return home with the following A little help with bathing/dressing/bathroom;Assistance with cooking/housework;Assist for transportation;Help with stairs or ramp for entrance;A little help with walking and/or transfers   Equipment Recommendations  Rolling walker (2 wheels)    Recommendations for Other Services       Precautions /  Restrictions Precautions Precautions: Fall Restrictions Weight Bearing Restrictions: No RLE Weight Bearing: Weight bearing as tolerated     Mobility  Bed Mobility Overal bed mobility: Needs Assistance Bed Mobility: Supine to Sit     Supine to sit: HOB elevated, Min guard     General bed mobility comments: cues to initaite bring LE's off EOB. Min guard for safety and pt required significant extra time and effort. Pt required use of be dfeatures (elevated HOB, bed rails) to complete and does not have bed featurs at home.    Transfers Overall transfer level: Needs assistance Equipment used: Rolling walker (2 wheels) Transfers: Sit to/from Stand Sit to Stand: Min assist           General transfer comment: cues for hand placement, safety on descent. incr time needed, assist to rise and transition to RW. assist to steady with sit<>stand from toilet due to LOB 2x.    Ambulation/Gait Ambulation/Gait assistance: Min assist Gait Distance (Feet): 15 Feet Assistive device: Rolling walker (2 wheels) Gait Pattern/deviations: Step-to pattern, Decreased stance time - right Gait velocity: decr     General Gait Details: Min assist for RW position and to steady balance. Pt with slight posterior lean at start and improved throughout.   Stairs             Wheelchair Mobility    Modified Rankin (Stroke Patients Only)       Balance Overall balance assessment: Needs assistance Sitting-balance support: No upper extremity supported, Feet supported Sitting balance-Leahy Scale: Fair Sitting balance - Comments: assist needed to lower body garment donning/doffing (mesh brief)   Standing balance support: Bilateral upper extremity supported, Single extremity supported, During functional activity, Reliant on assistive device for balance Standing  balance-Leahy Scale: Poor Standing balance comment: reliant on UE unilateral support and external assist to perform pericare, assist needed  to complete LB garment manipulation                            Cognition Arousal/Alertness: Awake/alert Behavior During Therapy: WFL for tasks assessed/performed Overall Cognitive Status: Within Functional Limits for tasks assessed                                          Exercises General Exercises - Lower Extremity Ankle Circles/Pumps: AROM, Both, 15 reps    General Comments        Pertinent Vitals/Pain Pain Assessment Pain Assessment: 0-10 Pain Score: 5  Pain Location: right hip Pain Descriptors / Indicators: Grimacing, Discomfort Pain Intervention(s): Limited activity within patient's tolerance, Monitored during session, Repositioned    Home Living                          Prior Function            PT Goals (current goals can now be found in the care plan section) Acute Rehab PT Goals Patient Stated Goal: home today PT Goal Formulation: With patient Time For Goal Achievement: 03/03/22 Potential to Achieve Goals: Good Progress towards PT goals: Progressing toward goals    Frequency    Min 5X/week      PT Plan Current plan remains appropriate    Co-evaluation              AM-PAC PT "6 Clicks" Mobility   Outcome Measure  Help needed turning from your back to your side while in a flat bed without using bedrails?: A Little Help needed moving from lying on your back to sitting on the side of a flat bed without using bedrails?: A Little Help needed moving to and from a bed to a chair (including a wheelchair)?: A Little Help needed standing up from a chair using your arms (e.g., wheelchair or bedside chair)?: A Little Help needed to walk in hospital room?: A Little Help needed climbing 3-5 steps with a railing? : A Lot 6 Click Score: 17    End of Session Equipment Utilized During Treatment: Gait belt Activity Tolerance: Patient limited by fatigue;Patient limited by pain Patient left: in chair;with call  bell/phone within reach;with chair alarm set Nurse Communication: Mobility status PT Visit Diagnosis: Other abnormalities of gait and mobility (R26.89);Difficulty in walking, not elsewhere classified (R26.2)     Time: 1000-1028 PT Time Calculation (min) (ACUTE ONLY): 28 min  Charges:  $Gait Training: 8-22 mins $Therapeutic Activity: 8-22 mins                     Verner Mould, DPT Acute Rehabilitation Services Office [phone]  02/26/22 12:24 PM

## 2022-02-26 NOTE — TOC Transition Note (Addendum)
Transition of Care [facility]) - CM/SW Discharge Note  Patient Details  Name: Kristen Macias MRN: [id number] Date of Birth: 1934/02/11  Transition of Care [facility]) CM/SW Contact:  Sherie Don, LCSW Phone Number: 02/26/2022, 1:02 PM  Clinical Narrative: Patient has regressed with PT and now SNF is being recommended. Patient and her granddaughter, Mordecai Maes, are agreeable to SNF. FL2 done; PASRR received. Initial referral faxed out in hub. Patient received bed offers. Patient and granddaughter chose [facility]. CSW confirmed bed with Lexine Baton in admissions at [facility]. The facility can accept the patient today pending insurance authorization.  CSW completed insurance authorization on the [employer] portal. Reference ID # is: [id number]. Patient has been approved for 02/26/2022-03/02/2022. Patient will go to room 505 and the number for report is [phone]. Discharge summary, discharge orders, and SNF transfer report faxed to facility in hub. Medical necessity form done; PTAR scheduled. Granddaughter has completed admission paperwork at [facility]. Discharge packet completed. RN updated. TOC signing off.  Addendum: CSW confirmed with Claiborne Billings that [facility] will follow the patient while at [facility] so that [facility] can be provided after discharging home.  Final next level of care: Skilled Nursing Facility Barriers to Discharge: Barriers Resolved  Patient Goals and CMS Choice CMS Medicare.gov Compare Post Acute Care list provided to:: Patient Choice offered to / list presented to : Patient  Discharge Placement PASRR number recieved: 02/26/22   Patient [facility] at: [facility] Patient to be transferred to facility by: [place] Name of family member notified: Mordecai Maes (granddaughter) Patient and family notified of of transfer: 02/26/22  Discharge Plan and Services Additional resources added to the After Visit Summary for   In-house Referral: Clinical Social Work Post Acute Care  Choice: Home Health          DME Arranged: Gilford Rile rolling DME Agency: [employer] Date DME Agency Contacted: 02/25/22 Time DME Agency Contacted: 1115 Representative spoke with at DME Agency: Erasmo Downer HH Arranged: PT, OT [place] Agency: [place] Date [place]: 02/25/22 Time [place]: 1059 Representative spoke with at [place]: [place] ([place]) Interventions SDOH Screenings   Food Insecurity: Unknown (02/22/2022)  Housing: Low Risk  (02/22/2022)  Transportation Needs: Unknown (02/22/2022)  Utilities: Unknown (02/22/2022)  Depression (PHQ2-9): Low Risk  (01/30/2020)  Tobacco Use: Medium Risk (02/24/2022)   Readmission Risk Interventions     No data to display

## 2022-02-26 NOTE — NC FL2 (Signed)
[place] LEVEL OF CARE FORM     IDENTIFICATION  Patient Name: Kristen Macias Birthdate: Dec 09, 1933 Sex: female Admission Date (Current Location): 02/22/2022  [place] and [place] Number:  Kristen Macias [id number] [place] and Address:  [facility],  [place], [place]      Provider Number: [id number]  Attending Physician Name and Address:  Nita Sells, MD  Relative Name and Phone Number:  Asa Saunas (sister) Ph: [phone]    Current Level of Care:   Recommended Level of Care: [place] Prior Approval Number:    Date Approved/Denied:   PASRR Number: [id number]  Discharge Plan: SNF    Current Diagnoses: Patient Active Problem List   Diagnosis Date Noted   Closed right hip fracture, initial encounter ([place]) 02/22/2022   COPD (chronic obstructive pulmonary disease) ([place]) 02/22/2022   Symptomatic anemia 03/15/2017   Bladder cancer ([place])    Acute cystitis with hematuria 12/25/2016   Anticoagulated 12/11/2016   Atrial fibrillation ([place]) 05/19/2016   Osteoporosis 03/08/2015   Compression fracture of T7 thoracic vertebra 03/07/2015   Hyperlipidemia 08/14/2014   Cataracts, bilateral 08/02/2014   Prediabetes 05/27/2013   DVT (deep venous thrombosis) ([place]) 05/18/2013   Dysphagia 01/15/2012   History of tobacco use 08/03/2011   GERD (gastroesophageal reflux disease) 08/03/2011   Goals of care, counseling/discussion 08/03/2011   COPD exacerbation ([place]) 08/03/2011    Orientation RESPIRATION BLADDER Height & Weight     Self, Time, Situation, Place  Normal Incontinent Weight: 192 lb 14.4 oz (87.5 kg) Height:  '5\' 3"'$  (160 cm)  BEHAVIORAL SYMPTOMS/MOOD NEUROLOGICAL BOWEL NUTRITION STATUS   (N/A)  (N/A) Continent Diet (Carb modified)  AMBULATORY STATUS COMMUNICATION OF NEEDS Skin   Limited Assist Verbally Surgical wounds                       Personal Care Assistance Level of Assistance  Bathing,  Feeding, Dressing Bathing Assistance: Limited assistance Feeding assistance: Independent Dressing Assistance: Limited assistance     Functional Limitations Info  Sight, Hearing, Speech Sight Info: Impaired Hearing Info: Adequate Speech Info: Adequate    SPECIAL CARE FACTORS FREQUENCY  PT (By licensed PT), OT (By licensed OT)     PT Frequency: 5x's/week OT Frequency: 5x's/week            Contractures Contractures Info: Not present    Additional Factors Info  Code Status, Allergies Code Status Info: Full Allergies Info: NKA           Current Medications (02/26/2022):  This is the current hospital active medication list Current Facility-Administered Medications  Medication Dose Route Frequency Provider Last Rate Last Admin   0.9 %  sodium chloride infusion   Intravenous Continuous Swinteck, Aaron Edelman, MD 100 mL/hr at 02/24/22 0825 New Bag at 02/24/22 0825   acetaminophen (TYLENOL) tablet 325-650 mg  325-650 mg Oral Q6H PRN Swinteck, Aaron Edelman, MD       albuterol (PROVENTIL) (2.5 MG/3ML) 0.083% nebulizer solution 2.5 mg  2.5 mg Inhalation Q4H PRN Swinteck, Aaron Edelman, MD       alum & mag hydroxide-simeth (MAALOX/MYLANTA) 200-200-20 MG/5ML suspension 30 mL  30 mL Oral Q4H PRN Rod Can, MD   30 mL at 02/23/22 2015   apixaban (ELIQUIS) tablet 2.5 mg  2.5 mg Oral Q12H Swinteck, Aaron Edelman, MD   2.5 mg at 02/26/22 0852   atorvastatin (LIPITOR) tablet 20 mg  20 mg Oral QHS Rod Can, MD   20 mg at 02/25/22  2159   docusate sodium (COLACE) capsule 100 mg  100 mg Oral BID Rod Can, MD   100 mg at 02/26/22 0853   gabapentin (NEURONTIN) capsule 100 mg  100 mg Oral BID Rod Can, MD   100 mg at 02/26/22 0853   guaiFENesin-dextromethorphan (ROBITUSSIN DM) 100-10 MG/5ML syrup 10 mL  10 mL Oral Q4H PRN Raenette Rover, NP   10 mL at 02/26/22 0852   HYDROcodone-acetaminophen (NORCO) 7.5-325 MG per tablet 1-2 tablet  1-2 tablet Oral Q4H PRN Rod Can, MD   2 tablet at 02/26/22  0853   HYDROcodone-acetaminophen (NORCO/VICODIN) 5-325 MG per tablet 1-2 tablet  1-2 tablet Oral Q4H PRN Rod Can, MD   2 tablet at 02/25/22 1226   menthol-cetylpyridinium (CEPACOL) lozenge 3 mg  1 lozenge Oral PRN Swinteck, Aaron Edelman, MD       Or   phenol (CHLORASEPTIC) mouth spray 1 spray  1 spray Mouth/Throat PRN Swinteck, Aaron Edelman, MD       methocarbamol (ROBAXIN) tablet 500 mg  500 mg Oral Q6H PRN Rod Can, MD   500 mg at 02/25/22 2159   Or   methocarbamol (ROBAXIN) 500 mg in dextrose 5 % 50 mL IVPB  500 mg Intravenous Q6H PRN Swinteck, Aaron Edelman, MD       metoCLOPramide (REGLAN) tablet 5-10 mg  5-10 mg Oral Q8H PRN Swinteck, Aaron Edelman, MD       Or   metoCLOPramide (REGLAN) injection 5-10 mg  5-10 mg Intravenous Q8H PRN Swinteck, Aaron Edelman, MD       metoprolol succinate (TOPROL-XL) 24 hr tablet 50 mg  50 mg Oral Daily Swinteck, Aaron Edelman, MD   50 mg at 02/26/22 0347   morphine (PF) 2 MG/ML injection 0.5-1 mg  0.5-1 mg Intravenous Q2H PRN Swinteck, Aaron Edelman, MD       multivitamin with minerals tablet 1 tablet  1 tablet Oral Daily Georgette Shell, MD   1 tablet at 02/26/22 4259   mupirocin ointment (BACTROBAN) 2 % 1 Application  1 Application Nasal BID Rod Can, MD   1 Application at 56/38/75 0853   ondansetron (ZOFRAN) tablet 4 mg  4 mg Oral Q6H PRN Swinteck, Aaron Edelman, MD       Or   ondansetron [facility]) injection 4 mg  4 mg Intravenous Q6H PRN Rod Can, MD   4 mg at 02/25/22 0852   pantoprazole (PROTONIX) EC tablet 40 mg  40 mg Oral Daily Rod Can, MD   40 mg at 02/26/22 0853   polyethylene glycol (MIRALAX / GLYCOLAX) packet 17 g  17 g Oral Daily Rod Can, MD   17 g at 02/26/22 6433   polyethylene glycol (MIRALAX / GLYCOLAX) packet 17 g  17 g Oral Daily PRN Rod Can, MD       protein supplement (ENSURE MAX) liquid  11 oz Oral Daily Georgette Shell, MD   11 oz at 02/25/22 1048   senna (SENOKOT) tablet 8.6 mg  1 tablet Oral BID Rod Can, MD   8.6 mg at  02/26/22 2951     Discharge Medications: Please see discharge summary for a list of discharge medications.  Relevant Imaging Results:  Relevant Lab Results:   Additional Information SSN: [id number]  Sherie Don, LCSW

## 2022-02-26 NOTE — Plan of Care (Signed)
Plan of care reviewed and discussed. °

## 2022-02-26 NOTE — Plan of Care (Signed)
Patient discharging to [facility] facility. Report called to Elsie Lincoln, Nurse at facility and AVS placed in discharge packet. Awaiting PTAR pick-up. Ivan Anchors, RN 02/26/22 3:05 PM

## 2022-02-26 NOTE — Progress Notes (Signed)
Occupational Therapy Treatment and Discharge Patient Details Name: Kristen Macias MRN: [id number] DOB: 08-Feb-1934 Today's Date: 02/26/2022   History of present illness Patient is a 89 year old female who presented after a fall on R side with R hip pain. Patient was found to have right acute impacted femoral neck fracture. Patient underwent R hip hemiarthroplasty through direct anterior approach on 1/1. PMH: a fib, bilateral cataracts, bladder CA, osteoporosis, COPD, T7 compression fx.   OT comments  This 89 yo female seen today to continue education on use of AE and let her practice. She is overall at a setup/min guard A level. I have given her a regular sock aid, long shoe horn, long handled sponge, and standard reacher. Pt will continue to benefit from follow up rehab at [facility] and plan is to D/C there today per grand-daughter in room. We will D/C from acute OT.   Recommendations for follow up therapy are one component of a multi-disciplinary discharge planning process, led by the attending physician.  Recommendations may be updated based on patient status, additional functional criteria and insurance authorization.    Follow Up Recommendations  Skilled nursing-short term rehab (<3 hours/day)     Assistance Recommended at Discharge Frequent or constant Supervision/Assistance  Patient can return home with the following  A little help with bathing/dressing/bathroom;A little help with walking and/or transfers;Assistance with cooking/housework;Help with stairs or ramp for entrance;Assist for transportation   Equipment Recommendations  Other (comment) (TBD next venue)       Precautions / Restrictions Precautions Precautions: Fall Restrictions Weight Bearing Restrictions: No RLE Weight Bearing: Weight bearing as tolerated       Mobility Bed Mobility Overal bed mobility: Needs Assistance Bed Mobility: Supine to Sit, Sit to Supine     Supine to sit: Min guard, HOB elevated Sit to supine:  Min guard (HOB flat, no rail, increased time)        Transfers Overall transfer level: Needs assistance Equipment used: Rolling walker (2 wheels) Transfers: Sit to/from Stand Sit to Stand: Min guard           General transfer comment: VCs for safe hand placement     Balance Overall balance assessment: Needs assistance Sitting-balance support: No upper extremity supported, Feet supported Sitting balance-Leahy Scale: Good     Standing balance support: During functional activity, Bilateral upper extremity supported Standing balance-Leahy Scale: Poor                             ADL either performed or assessed with clinical judgement   ADL Overall ADL's : Needs assistance/impaired     Grooming: Wash/dry hands;Min guard;Standing Grooming Details (indicate cue type and reason): pt with one LOB posteriorly while standing (she did catch herself, but I went to catch her at the same time)             Lower Body Dressing: Min guard;With adaptive equipment;Sit to/from stand   Toilet Transfer: Min guard;Ambulation;Rolling walker (2 wheels);BSC/3in1 (over toilet)   Toileting- Clothing Manipulation and Hygiene: Min guard;Sit to/from stand              Extremity/Trunk Assessment Upper Extremity Assessment Upper Extremity Assessment: Overall WFL for tasks assessed            Vision Patient Visual Report: No change from baseline            Cognition Arousal/Alertness: Awake/alert Behavior During Therapy: WFL for tasks assessed/performed Overall Cognitive  Status: Within Functional Limits for tasks assessed                                                     Pertinent Vitals/ Pain       Pain Assessment Pain Assessment: Faces Faces Pain Scale: Hurts a little bit Pain Location: right hip Pain Descriptors / Indicators: Grimacing, Discomfort Pain Intervention(s): Limited activity within patient's tolerance, Monitored during  session, Repositioned, Ice applied         Frequency  Min 2X/week        Progress Toward Goals  OT Goals(current goals can now be found in the care plan section)  Progress towards OT goals: Progressing toward goals  Acute Rehab OT Goals Patient Stated Goal: to go home, but now open to going to rehab first since she does not have 24 hour care at home OT Goal Formulation: With patient Time For Goal Achievement: 03/10/22 Potential to Achieve Goals: [place] Discharge plan needs to be updated       AM-PAC OT "6 Clicks" Daily Activity     Outcome Measure   Help from another person eating meals?: None Help from another person taking care of personal grooming?: A Little Help from another person toileting, which includes using toliet, bedpan, or urinal?: A Little Help from another person bathing (including washing, rinsing, drying)?: A Little Help from another person to put on and taking off regular upper body clothing?: A Little Help from another person to put on and taking off regular lower body clothing?: A Little 6 Click Score: 19    End of Session Equipment Utilized During Treatment: Gait belt;Rolling walker (2 wheels)  OT Visit Diagnosis: Unsteadiness on feet (R26.81);Other abnormalities of gait and mobility (R26.89);Muscle weakness (generalized) (M62.81);Pain Pain - Right/Left: Right Pain - part of body: Hip   Activity Tolerance Patient tolerated treatment well   Patient Left in bed;with call bell/phone within reach;with bed alarm set           Time: 8937-3428 OT Time Calculation (min): 22 min  Charges: OT General Charges $OT Visit: 1 Visit OT Treatments $Self Care/Home Management : 8-22 mins  [place], OTR/L Acute Rehab Services [facility] [place] [phone] Office [phone]    Almon Register 02/26/2022, 3:27 PM

## 2022-02-26 NOTE — Plan of Care (Signed)
Problem: Education: Goal: Knowledge of General Education information will improve Description: Including pain rating scale, medication(s)/side effects and non-pharmacologic comfort measures Outcome: Progressing   Problem: Clinical Measurements: Goal: Ability to maintain clinical measurements within normal limits will improve Outcome: Progressing   Problem: Pain Managment: Goal: General experience of comfort will improve Outcome: Progressing   Problem: Safety: Goal: Ability to remain free from injury will improve Outcome: [place], RN 02/26/22 9:56 AM

## 2022-02-27 LAB — TYPE AND SCREEN
ABO/RH(D): A NEG
Antibody Screen: POSITIVE
Donor AG Type: NEGATIVE
Unit division: 0

## 2022-02-27 LAB — BPAM RBC
Blood Product Expiration Date: 202401302359
Unit Type and Rh: 9500

## 2022-07-24 ENCOUNTER — Encounter: Payer: Self-pay | Admitting: General Practice

## 2022-12-07 ENCOUNTER — Other Ambulatory Visit (HOSPITAL_COMMUNITY): Payer: Self-pay | Admitting: Urology

## 2022-12-07 ENCOUNTER — Ambulatory Visit (HOSPITAL_COMMUNITY)
Admission: RE | Admit: 2022-12-07 | Discharge: 2022-12-07 | Disposition: A | Discharge status: Home / Self Care | Payer: 59 | Source: Ambulatory Visit | Attending: Urology | Admitting: Urology

## 2022-12-07 DIAGNOSIS — C678 Malignant neoplasm of overlapping sites of bladder: Secondary | ICD-10-CM | POA: Insufficient documentation

## 2023-08-12 ENCOUNTER — Emergency Department (HOSPITAL_COMMUNITY)

## 2023-08-12 ENCOUNTER — Other Ambulatory Visit: Payer: Self-pay

## 2023-08-12 ENCOUNTER — Encounter: Payer: Self-pay | Admitting: Emergency Medicine

## 2023-08-12 ENCOUNTER — Emergency Department (HOSPITAL_COMMUNITY)
Admission: EM | Admit: 2023-08-12 | Discharge: 2023-08-12 | Disposition: A | Discharge status: Home / Self Care | Attending: Emergency Medicine | Admitting: Emergency Medicine

## 2023-08-12 ENCOUNTER — Ambulatory Visit: Admission: EM | Admit: 2023-08-12 | Discharge: 2023-08-12 | Disposition: A | Discharge status: Home / Self Care

## 2023-08-12 DIAGNOSIS — M79605 Pain in left leg: Secondary | ICD-10-CM | POA: Diagnosis present

## 2023-08-12 DIAGNOSIS — M7989 Other specified soft tissue disorders: Secondary | ICD-10-CM | POA: Diagnosis not present

## 2023-08-12 DIAGNOSIS — Z7901 Long term (current) use of anticoagulants: Secondary | ICD-10-CM | POA: Diagnosis not present

## 2023-08-12 DIAGNOSIS — Z86718 Personal history of other venous thrombosis and embolism: Secondary | ICD-10-CM | POA: Diagnosis not present

## 2023-08-12 DIAGNOSIS — M79662 Pain in left lower leg: Secondary | ICD-10-CM

## 2023-08-12 LAB — CBC WITH DIFFERENTIAL/PLATELET
Abs Immature Granulocytes: 0.01 10*3/uL (ref 0.00–0.07)
Basophils Absolute: 0.1 10*3/uL (ref 0.0–0.1)
Basophils Relative: 2 %
Eosinophils Absolute: 0.2 10*3/uL (ref 0.0–0.5)
Eosinophils Relative: 3 %
HCT: 37.9 % (ref 36.0–46.0)
Hemoglobin: 11.9 g/dL — ABNORMAL LOW (ref 12.0–15.0)
Immature Granulocytes: 0 %
Lymphocytes Relative: 18 %
Lymphs Abs: 1.3 10*3/uL (ref 0.7–4.0)
MCH: 25.9 pg — ABNORMAL LOW (ref 26.0–34.0)
MCHC: 31.4 g/dL (ref 30.0–36.0)
MCV: 82.4 fL (ref 80.0–100.0)
Monocytes Absolute: 0.5 10*3/uL (ref 0.1–1.0)
Monocytes Relative: 7 %
Neutro Abs: 4.8 10*3/uL (ref 1.7–7.7)
Neutrophils Relative %: 70 %
Platelets: 259 10*3/uL (ref 150–400)
RBC: 4.6 MIL/uL (ref 3.87–5.11)
RDW: 16.1 % — ABNORMAL HIGH (ref 11.5–15.5)
WBC: 6.8 10*3/uL (ref 4.0–10.5)
nRBC: 0 % (ref 0.0–0.2)

## 2023-08-12 LAB — COMPREHENSIVE METABOLIC PANEL WITH GFR
ALT: 16 U/L (ref 0–44)
AST: 26 U/L (ref 15–41)
Albumin: 3.7 g/dL (ref 3.5–5.0)
Alkaline Phosphatase: 87 U/L (ref 38–126)
Anion gap: 9 (ref 5–15)
BUN: 24 mg/dL — ABNORMAL HIGH (ref 8–23)
CO2: 26 mmol/L (ref 22–32)
Calcium: 9.2 mg/dL (ref 8.9–10.3)
Chloride: 102 mmol/L (ref 98–111)
Creatinine, Ser: 1.07 mg/dL — ABNORMAL HIGH (ref 0.44–1.00)
GFR, Estimated: 50 mL/min — ABNORMAL LOW (ref >60)
Glucose, Bld: 121 mg/dL — ABNORMAL HIGH (ref 70–99)
Potassium: 4 mmol/L (ref 3.5–5.1)
Sodium: 137 mmol/L (ref 135–145)
Total Bilirubin: 0.4 mg/dL (ref 0.0–1.2)
Total Protein: 7.6 g/dL (ref 6.5–8.1)

## 2023-08-12 LAB — PROTIME-INR
INR: 0.9 (ref 0.8–1.2)
Prothrombin Time: 12.7 s (ref 11.4–15.2)
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
## 2023-08-12 NOTE — ED Triage Notes (Signed)
 Pt presents with left leg swelling from below the knee down to her ankle including some of her foot for about 2 weeks.

## 2023-08-12 NOTE — ED Provider Notes (Signed)
 [place] EMERGENCY DEPARTMENT AT [facility] Provider Note   CSN: [id number] Arrival date & time: 08/12/23  1609     Patient presents with: Leg Pain   Kristen Macias is a 89 y.o. female. 89 y.o female with a past medical history of DVT on Eliquis  presents to the ED with a chief complaint of left leg pain which has been ongoing for the past 2 weeks.  Reports this is worsened after daily activity, has taken over-the-counter medication along with Tylenol  without any improvement in her symptoms.  She is anticoagulated on Eliquis , reports compliance with her medication.  Shortness of breath, no chest pain, no trauma, no fevers.  The history is provided by the patient.  Leg Pain Associated symptoms: no fever        Prior to Admission medications   Medication Sig Start Date End Date Taking? Authorizing Provider  acetaminophen  (TYLENOL ) 500 MG tablet Take 1,000 mg by mouth every 6 (six) hours as needed for moderate pain.    [provider]  albuterol  (VENTOLIN  HFA) 108 (90 Base) MCG/ACT inhaler INHALE 1 TO 2 PUFFS EVERY 6 HOURS AS NEEDED FOR WHEEZING OR SHORTNESS OF BREATH Patient taking differently: Inhale 2 puffs into the lungs every 6 (six) hours as needed for wheezing or shortness of breath. 06/10/20   Joelle Musca, MD  apixaban  (ELIQUIS ) 2.5 MG TABS tablet Take 1 tablet (2.5 mg total) by mouth 2 (two) times daily. 02/25/22   Hill, Philippa Bray, PA-C  atorvastatin  (LIPITOR) 20 MG tablet Take 20 mg by mouth at bedtime. 01/23/22   [provider]  Calcium  Carb-Cholecalciferol  (CALCIUM -VITAMIN D ) 600-400 MG-UNIT TABS Take 2 tablets by mouth daily. Patient not taking: Reported on 02/22/2022 03/09/19   Rumball, Alison M, DO  clotrimazole-betamethasone (LOTRISONE) cream APPLY THIN LAYER TO RASH THREE TIMES DAILY UNTIL HEALED.    [provider]  gabapentin  (NEURONTIN ) 100 MG capsule Take 100 mg by mouth 2 (two) times daily. 11/24/21   [provider]   metFORMIN  (GLUCOPHAGE -XR) 500 MG 24 hr tablet Take 500 mg by mouth every evening. 01/23/22   [provider]  methocarbamol  (ROBAXIN ) 500 MG tablet Take 1 tablet (500 mg total) by mouth every 6 (six) hours as needed for muscle spasms. 02/25/22   Samtani, Jai-Gurmukh, MD  metoprolol  succinate (TOPROL -XL) 50 MG 24 hr tablet Take 1 tablet (50 mg total) by mouth daily. Take with or immediately following a meal. 02/26/22   Samtani, Jai-Gurmukh, MD  omeprazole  (PRILOSEC) 20 MG capsule TAKE 1 CAPSULE (20 MG TOTAL) BY MOUTH DAILY. 12/01/19   Joelle Musca, MD  phenazopyridine  (PYRIDIUM ) 200 MG tablet Take 1 tablet (200 mg total) by mouth 3 (three) times daily as needed for pain. Patient not taking: Reported on 02/22/2022 11/11/17   Andrez Banker, MD  polyethylene glycol powder (GLYCOLAX /MIRALAX ) powder Take 17 g by mouth daily. Patient taking differently: Take 17 g by mouth as needed for mild constipation. 05/18/13   Keith Pat, MD  senna (SENOKOT) 8.6 MG TABS tablet Take 1 tablet (8.6 mg total) by mouth 2 (two) times daily. 02/25/22   Samtani, Jai-Gurmukh, MD  SPIRIVA  HANDIHALER 18 MCG inhalation capsule INHALE THE CONTENTS OF 1 CAPSULE EVERY DAY Patient not taking: Reported on 02/22/2022 09/19/19   Joelle Musca, MD    Allergies: Patient has no known allergies.    Review of Systems  Constitutional:  Negative for fever.  Musculoskeletal:  Positive for myalgias.    Updated Vital Signs BP Aaron Aas)  144/78 (BP Location: Left Arm)   Pulse 95   Temp 98 F (36.7 C) (Oral)   Resp 16   Ht 5' 3 (1.6 m)   Wt 87 kg   SpO2 97%   BMI 33.98 kg/m   Physical Exam Vitals and nursing note reviewed.  Constitutional:      Appearance: Normal appearance.  HENT:     Head: Normocephalic and atraumatic.   Cardiovascular:     Rate and Rhythm: Normal rate.  Pulmonary:     Effort: Pulmonary effort is normal.  Abdominal:     General: Abdomen is flat.     Palpations: Abdomen is soft.    Musculoskeletal:     Cervical back: Normal range of motion and neck supple.   Skin:    General: Skin is warm and dry.   Neurological:     Mental Status: She is alert and oriented to person, place, and time.     Comments: RLE- KF,KE 5/5 strength LLE- HF, HE 5/5 strength Normal gait. No pronator drift. No leg drop.  CN I, II and VIII not tested. CN II-XII grossly intact bilaterally.        (all labs ordered are listed, but only abnormal results are displayed) Labs Reviewed  CBC WITH DIFFERENTIAL/PLATELET - Abnormal; Notable for the following components:      Result Value   Hemoglobin 11.9 (*)    MCH 25.9 (*)    RDW 16.1 (*)    All other components within normal limits  COMPREHENSIVE METABOLIC PANEL WITH GFR - Abnormal; Notable for the following components:   Glucose, Bld 121 (*)    BUN 24 (*)    Creatinine, Ser 1.07 (*)    GFR, Estimated 50 (*)    All other components within normal limits  PROTIME-INR    EKG: None  Radiology: VAS US  LOWER EXTREMITY VENOUS (DVT) (7a-7p) Result Date: 08/12/2023  Lower Venous DVT Study Patient Name:  Kristen Macias  Date of Exam:   08/12/2023 Medical Rec #: [id number]      Accession #:    [id number] Date of Birth: 22-May-1933      Patient Gender: F Patient Age:   78 years Exam Location:  [facility] Procedure:      VAS US  LOWER EXTREMITY VENOUS (DVT) Referring Phys: OSCAR ZELAYA --------------------------------------------------------------------------------  Indications: Pain.  Risk Factors: None identified. Comparison Study: No prior studies. Performing Technologist: Lerry Ransom RVT  Examination Guidelines: A complete evaluation includes B-mode imaging, spectral Doppler, color Doppler, and power Doppler as needed of all accessible portions of each vessel. Bilateral testing is considered an integral part of a complete examination. Limited examinations for reoccurring indications may be performed as noted. The reflux portion of the exam  is performed with the patient in reverse Trendelenburg.  +-----+---------------+---------+-----------+----------+--------------+ RIGHTCompressibilityPhasicitySpontaneityPropertiesThrombus Aging +-----+---------------+---------+-----------+----------+--------------+ CFV  Full           Yes      Yes                                 +-----+---------------+---------+-----------+----------+--------------+   +---------+---------------+---------+-----------+----------+--------------+ LEFT     CompressibilityPhasicitySpontaneityPropertiesThrombus Aging +---------+---------------+---------+-----------+----------+--------------+ CFV      Full           Yes      Yes                                 +---------+---------------+---------+-----------+----------+--------------+  SFJ      Full                                                        +---------+---------------+---------+-----------+----------+--------------+ FV Prox  Full                                                        +---------+---------------+---------+-----------+----------+--------------+ FV Mid   Full                                                        +---------+---------------+---------+-----------+----------+--------------+ FV DistalFull                                                        +---------+---------------+---------+-----------+----------+--------------+ PFV      Full                                                        +---------+---------------+---------+-----------+----------+--------------+ POP      Full           Yes      Yes                                 +---------+---------------+---------+-----------+----------+--------------+ PTV      Full                                                        +---------+---------------+---------+-----------+----------+--------------+ PERO     Full                                                         +---------+---------------+---------+-----------+----------+--------------+     Summary: RIGHT: - No evidence of common femoral vein obstruction.   LEFT: - There is no evidence of deep vein thrombosis in the lower extremity.  - No cystic structure found in the popliteal fossa.  *See table(s) above for measurements and observations. Electronically signed by Runell Countryman on 08/12/2023 at 5:44:58 PM.    Final                                 Medical Decision Making Amount and/or Complexity of Data Reviewed Labs: ordered.   Patient presented to the ED  center from urgent care due to ongoing left leg pain for the past 2 weeks.  Currently anticoagulated on Eliquis  due to prior history of DVT.  Reports she has been taking over-the-counter medication without any improvement in symptoms, taking Tylenol  at home to get any rest.  DVT study while in the ED was ordered and this was negative.  I discussed with her appropriate follow-up with PCP.  She denies any trauma, no fever, no skin changes to suggest infection.  Explained results to patient, she is hemodynamically stable for discharge.  Portions of this note were generated with Scientist, clinical (histocompatibility and immunogenetics). Dictation errors may occur despite best attempts at proofreading.   Final diagnoses:  Left leg pain    ED Discharge Orders     None          Luellen Sages, PA-C 08/12/23 1752    Tegeler, Marine Sia, MD 08/12/23 2312

## 2023-08-12 NOTE — Progress Notes (Signed)
 Left lower extremity venous duplex has been completed. Preliminary results can be found in CV Proc through chart review.  Results were given to Dixon Fredrickson PA.  08/12/23 5:39 PM Birda Buffy RVT

## 2023-08-12 NOTE — Discharge Instructions (Addendum)
 The results of your DVT study were negative on today's visit.  Follow-up with your PCP as scheduled.

## 2023-08-12 NOTE — ED Notes (Signed)
 Patient is being discharged from the Urgent Care and sent to the Emergency Department via POV . Per Jami Mcclintock, PA-C, patient is in need of higher level of care due to leg swelling with history of dvt. Patient is aware and verbalizes understanding of plan of care.  Vitals:   08/12/23 1526  BP: 122/78  Pulse: 100  Resp: 18  Temp: 97.9 F (36.6 C)  SpO2: 97%

## 2023-08-12 NOTE — ED Triage Notes (Signed)
 Pt sent from primary provider for left leg pain. Upon assessment, pain stated she felt sharp when her left leg was palpated. Pt states that her leg has been hurting for about 2 weeks. Her primary provider sent the patient to have a scan done on her leg to rule out potential clots.

## 2023-09-08 ENCOUNTER — Ambulatory Visit
Admission: EM | Admit: 2023-09-08 | Discharge: 2023-09-08 | Disposition: A | Discharge status: Home / Self Care | Attending: Family Medicine | Admitting: Family Medicine

## 2023-09-08 DIAGNOSIS — M25561 Pain in right knee: Secondary | ICD-10-CM | POA: Diagnosis not present

## 2023-09-08 DIAGNOSIS — W19XXXA Unspecified fall, initial encounter: Secondary | ICD-10-CM | POA: Diagnosis not present

## 2023-09-08 DIAGNOSIS — M25562 Pain in left knee: Secondary | ICD-10-CM

## 2023-09-08 LAB — POCT FASTING CBG KUC MANUAL ENTRY: POCT Glucose (KUC): 163 mg/dL — AB (ref 70–99)

## 2023-09-08 NOTE — Discharge Instructions (Addendum)
 Results for orders placed or performed during the hospital encounter of 09/08/23  POCT CBG (manual entry)   Collection Time: 09/08/23 12:48 PM  Result Value Ref Range   POCT Glucose ([facility]) 163 (A) 70 - 99 mg/dL

## 2023-09-08 NOTE — ED Triage Notes (Signed)
 Pt states she fell this morning while standing at her skink. Pt states she is having pain in her knees and neck on left side but is still able move her neck.   Pt states she has not took her prescribed medications in 2 months.
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
